# Patient Record
Sex: Male | Born: 1965 | Race: White | Hispanic: No | Smoking: Heavy tobacco smoker
Health system: Southern US, Community
[De-identification: ages and names within clinical notes are randomized; demographics above are authoritative.]

## PROBLEM LIST (undated history)

## (undated) DIAGNOSIS — E785 Hyperlipidemia, unspecified: Secondary | ICD-10-CM

## (undated) DIAGNOSIS — I82409 Acute embolism and thrombosis of unspecified deep veins of unspecified lower extremity: Secondary | ICD-10-CM

## (undated) DIAGNOSIS — J219 Acute bronchiolitis, unspecified: Secondary | ICD-10-CM

## (undated) DIAGNOSIS — K219 Gastro-esophageal reflux disease without esophagitis: Secondary | ICD-10-CM

## (undated) DIAGNOSIS — I739 Peripheral vascular disease, unspecified: Secondary | ICD-10-CM

## (undated) DIAGNOSIS — F419 Anxiety disorder, unspecified: Secondary | ICD-10-CM

## (undated) DIAGNOSIS — J189 Pneumonia, unspecified organism: Secondary | ICD-10-CM

## (undated) HISTORY — DX: Acute bronchiolitis, unspecified: J21.9

## (undated) HISTORY — DX: Pneumonia, unspecified organism: J18.9

## (undated) HISTORY — DX: Anxiety disorder, unspecified: F41.9

## (undated) HISTORY — PX: HAND SURGERY: SHX662

## (undated) HISTORY — DX: Peripheral vascular disease, unspecified: I73.9

## (undated) HISTORY — PX: TONSILLECTOMY: SUR1361

## (undated) HISTORY — DX: Hyperlipidemia, unspecified: E78.5

## (undated) HISTORY — PX: CATARACT EXTRACTION W/ INTRAOCULAR LENS IMPLANT: SHX1309

## (undated) HISTORY — DX: Acute embolism and thrombosis of unspecified deep veins of unspecified lower extremity: I82.409

---
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
## 1993-01-17 HISTORY — PX: ROTATOR CUFF REPAIR: SHX139

## 2004-04-02 ENCOUNTER — Ambulatory Visit: Payer: Self-pay | Admitting: Family Medicine

## 2004-09-06 ENCOUNTER — Ambulatory Visit (HOSPITAL_BASED_OUTPATIENT_CLINIC_OR_DEPARTMENT_OTHER): Admission: RE | Admit: 2004-09-06 | Discharge: 2004-09-06 | Payer: Self-pay | Admitting: Orthopedic Surgery

## 2004-09-06 ENCOUNTER — Ambulatory Visit (HOSPITAL_COMMUNITY): Admission: RE | Admit: 2004-09-06 | Discharge: 2004-09-06 | Payer: Self-pay | Admitting: Orthopedic Surgery

## 2005-01-17 HISTORY — PX: AORTA - ILIAC ARTERY BYPASS GRAFT: SUR174

## 2005-02-23 ENCOUNTER — Ambulatory Visit: Payer: Self-pay | Admitting: Family Medicine

## 2005-03-21 ENCOUNTER — Ambulatory Visit: Payer: Self-pay | Admitting: Family Medicine

## 2005-05-26 ENCOUNTER — Encounter: Admission: RE | Admit: 2005-05-26 | Discharge: 2005-05-26 | Payer: Self-pay | Admitting: Neurosurgery

## 2005-07-01 ENCOUNTER — Ambulatory Visit: Admission: RE | Admit: 2005-07-01 | Discharge: 2005-07-01 | Payer: Self-pay | Admitting: *Deleted

## 2007-07-12 ENCOUNTER — Ambulatory Visit: Payer: Self-pay | Admitting: *Deleted

## 2008-09-04 ENCOUNTER — Ambulatory Visit: Payer: Self-pay | Admitting: *Deleted

## 2008-11-23 ENCOUNTER — Encounter (INDEPENDENT_AMBULATORY_CARE_PROVIDER_SITE_OTHER): Payer: Self-pay | Admitting: Emergency Medicine

## 2008-11-23 ENCOUNTER — Ambulatory Visit: Payer: Self-pay | Admitting: Vascular Surgery

## 2008-11-23 ENCOUNTER — Inpatient Hospital Stay (HOSPITAL_COMMUNITY): Admission: EM | Admit: 2008-11-23 | Discharge: 2008-11-28 | Payer: Self-pay | Admitting: Emergency Medicine

## 2008-11-25 HISTORY — PX: THROMBOLYSIS: SHX2508

## 2008-11-26 ENCOUNTER — Encounter: Payer: Self-pay | Admitting: Vascular Surgery

## 2009-01-06 ENCOUNTER — Ambulatory Visit: Payer: Self-pay | Admitting: Vascular Surgery

## 2009-02-19 ENCOUNTER — Ambulatory Visit: Payer: Self-pay | Admitting: Vascular Surgery

## 2009-02-19 ENCOUNTER — Ambulatory Visit (HOSPITAL_COMMUNITY): Admission: RE | Admit: 2009-02-19 | Discharge: 2009-02-19 | Payer: Self-pay | Admitting: Surgery

## 2009-05-26 ENCOUNTER — Ambulatory Visit: Payer: Self-pay | Admitting: Vascular Surgery

## 2009-12-03 ENCOUNTER — Ambulatory Visit: Payer: Self-pay | Admitting: Vascular Surgery

## 2010-01-29 ENCOUNTER — Ambulatory Visit: Admit: 2010-01-29 | Payer: Self-pay | Admitting: Vascular Surgery

## 2010-04-07 LAB — POCT I-STAT, CHEM 8
Creatinine, Ser: 1.1 mg/dL (ref 0.4–1.5)
HCT: 49 % (ref 39.0–52.0)
TCO2: 33 mmol/L (ref 0–100)

## 2010-04-07 LAB — PROTIME-INR: INR: 0.96 (ref 0.00–1.49)

## 2010-04-21 LAB — DIFFERENTIAL
Basophils Absolute: 0 10*3/uL (ref 0.0–0.1)
Basophils Relative: 0 % (ref 0–1)
Eosinophils Absolute: 0.1 K/uL (ref 0.0–0.7)
Eosinophils Relative: 2 % (ref 0–5)
Lymphocytes Relative: 24 % (ref 12–46)
Lymphs Abs: 1.6 K/uL (ref 0.7–4.0)
Monocytes Absolute: 0.6 10*3/uL (ref 0.1–1.0)
Monocytes Relative: 9 % (ref 3–12)
Neutro Abs: 4.2 10*3/uL (ref 1.7–7.7)
Neutrophils Relative %: 64 % (ref 43–77)

## 2010-04-21 LAB — CBC
HCT: 51.2 % (ref 39.0–52.0)
HCT: 55.8 % — ABNORMAL HIGH (ref 39.0–52.0)
Hemoglobin: 17.1 g/dL — ABNORMAL HIGH (ref 13.0–17.0)
Hemoglobin: 17.3 g/dL — ABNORMAL HIGH (ref 13.0–17.0)
Hemoglobin: 19.5 g/dL — ABNORMAL HIGH (ref 13.0–17.0)
MCHC: 34.5 g/dL (ref 30.0–36.0)
MCHC: 34.7 g/dL (ref 30.0–36.0)
MCHC: 34.8 g/dL (ref 30.0–36.0)
MCHC: 34.9 g/dL (ref 30.0–36.0)
MCV: 104.8 fL — ABNORMAL HIGH (ref 78.0–100.0)
MCV: 106.3 fL — ABNORMAL HIGH (ref 78.0–100.0)
Platelets: 123 10*3/uL — ABNORMAL LOW (ref 150–400)
Platelets: 125 10*3/uL — ABNORMAL LOW (ref 150–400)
Platelets: 131 10*3/uL — ABNORMAL LOW (ref 150–400)
Platelets: 135 10*3/uL — ABNORMAL LOW (ref 150–400)
Platelets: 144 K/uL — ABNORMAL LOW (ref 150–400)
RBC: 4.88 MIL/uL (ref 4.22–5.81)
RBC: 5.33 MIL/uL (ref 4.22–5.81)
RDW: 14.1 % (ref 11.5–15.5)
RDW: 14.3 % (ref 11.5–15.5)
RDW: 14.4 % (ref 11.5–15.5)
RDW: 14.7 % (ref 11.5–15.5)
WBC: 6.6 K/uL (ref 4.0–10.5)

## 2010-04-21 LAB — PROTIME-INR
INR: 0.97 (ref 0.00–1.49)
INR: 0.99 (ref 0.00–1.49)
INR: 1.33 (ref 0.00–1.49)
Prothrombin Time: 12.8 seconds (ref 11.6–15.2)
Prothrombin Time: 13 s (ref 11.6–15.2)
Prothrombin Time: 17.6 seconds — ABNORMAL HIGH (ref 11.6–15.2)

## 2010-04-21 LAB — BASIC METABOLIC PANEL
CO2: 25 mEq/L (ref 19–32)
Calcium: 8.4 mg/dL (ref 8.4–10.5)
Chloride: 97 mEq/L (ref 96–112)
Creatinine, Ser: 1.09 mg/dL (ref 0.4–1.5)
GFR calc Af Amer: >60 mL/min (ref >60)
Potassium: 3.6 mEq/L (ref 3.5–5.1)
Sodium: 139 mEq/L (ref 135–145)

## 2010-04-21 LAB — CREATININE, SERUM: GFR calc non Af Amer: >60 mL/min (ref >60)

## 2010-04-21 LAB — HEPARIN LEVEL (UNFRACTIONATED)
Heparin Unfractionated: 0.31 IU/mL (ref 0.30–0.70)
Heparin Unfractionated: 0.33 IU/mL (ref 0.30–0.70)
Heparin Unfractionated: <0.1 IU/mL — ABNORMAL LOW (ref 0.30–0.70)
Heparin Unfractionated: <0.1 IU/mL — ABNORMAL LOW (ref 0.30–0.70)
Heparin Unfractionated: <0.1 IU/mL — ABNORMAL LOW (ref 0.30–0.70)

## 2010-04-21 LAB — BASIC METABOLIC PANEL WITH GFR
BUN: 5 mg/dL — ABNORMAL LOW (ref 6–23)
GFR calc non Af Amer: >60 mL/min (ref >60)
Glucose, Bld: 98 mg/dL (ref 70–99)

## 2010-04-21 LAB — APTT: aPTT: 25 seconds (ref 24–37)

## 2010-06-01 ENCOUNTER — Ambulatory Visit: Payer: Self-pay | Admitting: Vascular Surgery

## 2010-06-01 NOTE — Procedures (Signed)
DUPLEX DEEP VENOUS EXAM - LOWER EXTREMITY   INDICATION:  Right lower extremity edema.   HISTORY:  Edema:  Yes.  Trauma/Surgery:  No.  Pain:  Yes.  PE:  No.  Previous DVT:  The patient not sure.  Anticoagulants:  No.  Other:   DUPLEX EXAM:                CFV   SFV   PopV  PTV    GSV                R  L  R  L  R  L  R   L  R  L  Thrombosis    o  o  o     o     o      o  Spontaneous   +  +  +     +     +      +  Phasic        +  +  +     +     +      +  Augmentation  +  +  +     +     +      +  Compressible  +  +  +     +     +      +  Competent     +  0  +     +     +      +   Legend:  + - yes  o - no  p - partial  D - decreased   IMPRESSION:  1. Right lower extremity deep are negative for DVT.  2. The right short saphenous vein is positive for clinically      significant reflux of < 500 milliseconds.  3. The right small saphenous vein measures 0.66 cm.    _____________________________  Janetta Hora. Fields, MD   EM/MEDQ  D:  12/03/2009  T:  12/03/2009  Job:  [id number]

## 2010-06-01 NOTE — Assessment & Plan Note (Signed)
OFFICE VISIT   DOYL, BITTING  DOB:  1965/03/01                                       05/26/2009  CHART#:[id number]   The patient returns today for continued followup regarding his recent  episode of thrombosis of the right iliac stent which was treated with  thrombolysis in November of 2010.  The stent has remained widely patent  and he most recently in December had been normal ABIs and no  claudication symptoms.  He does continue to have a tight sensation in  his right calf which is chronic and constant and has only been present  since this thrombotic episode.  He is able to ambulate a mile or so and  states that the tightness slightly improves with walking but is present  even at rest.  He has no history of nonhealing ulcers, infection,  cellulitis or numbness in the feet.   CHRONIC MEDICAL PROBLEMS:  1. Tobacco use.  2. Negative for coronary artery disease, diabetes, COPD or stroke.   REVIEW OF SYSTEMS:  He denies any chest pain, dyspnea on exertion, PND,  orthopnea.  No chronic bronchitis, asthma, wheezing.  Does have some  reflux esophagitis.  All other GI symptoms are negative.  All other  systems in review of systems are negative.   PHYSICAL EXAMINATION:  Vital signs:  Blood pressure 130/90, heart rate  80, respirations 18.  General:  He is well-developed, well-nourished  middle-aged male in no apparent distress, alert and oriented x3.  HEENT:  Exam is normal.  EOMs intact.  Lungs:  Clear to auscultation.  No  rhonchi or wheezing.  Cardiovascular:  Regular rhythm.  No murmurs.  Carotid pulses 3+ and no audible bruits.  Abdomen:  Soft, nontender with  no masses.  Musculoskeletal:  Exam is free of major deformities.  Neurological:  Exam is normal.  Lower extremities:  Exam reveals 3+  femoral and popliteal and posterior tibial pulses palpable bilaterally.   Today I ordered lower extremity arterial Doppler studies which I  reviewed and  interpreted.  He has triphasic flow in both feet with ABI  greater than 1.  He also had a scan of his right common iliac stent  which is free of any thrombus or disease.  I would recommend  discontinuing his Coumadin in 1 month which will be 7 months of Coumadin  and changing him to Plavix.  We will follow him in the vascular lab on  an every 6 month basis to watch for any in-stent stenosis but at the  present time his calf symptoms are probably due more to some neuropathy  rather than arterial insufficiency which he does not have.     Quita Skye Hart [place], M.D.  Electronically Signed   JDL/MEDQ  D:  05/26/2009  T:  05/27/2009  Job:  [id number]   cc:   Dina Rich

## 2010-06-01 NOTE — Procedures (Signed)
AORTA-ILIAC DUPLEX EVALUATION   INDICATION:  Right common iliac artery stent.   HISTORY:  Diabetes:  No.  Cardiac:  No.  Hypertension:  Yes.  Smoking:  Yes.  Previous Surgery:  Right common iliac artery PTA stent in 2007,  thrombolysis of the right common iliac on 11/2008.               SINGLE LEVEL ARTERIAL EXAM                              RIGHT                  LEFT  Brachial:                  129                    131  Anterior tibial:           129                    129  Posterior tibial:          149                    151  Peroneal:  Ankle/brachial index:      1.14                   1.15  Previous ABI/date:         1.14 on 09/04/08       1.14 on 09/04/08   AORTA-ILIAC DUPLEX EXAM  Aorta - Proximal     71 cm/s  Aorta - Mid          64 cm/s  Aorta - Distal       90 cm/s   RIGHT                                   LEFT  115 cm/s          CIA-PROXIMAL          154 cm/s  127 cm/s          CIA-DISTAL            85 cm/s                    HYPOGASTRIC  90 cm/s           EIA-PROXIMAL          98 cm/s                    EIA-MID  141 cm/s          EIA-DISTAL            130 cm/s   IMPRESSION:  1. Patent aorta and iliacs noted bilaterally.  2. Patent right common iliac artery stent with no focal stenosis.  3. Normal bilateral ankle brachial indices.   ___________________________________________  Quita Skye Hart [place], M.D.   CB/MEDQ  D:  01/06/2009  T:  01/07/2009  Job:  [id number]

## 2010-06-01 NOTE — Assessment & Plan Note (Signed)
OFFICE VISIT   Aaron Cunningham, Aaron Cunningham  DOB:  29-Jan-1965                                       01/06/2009  CHART#:[id number]   The patient returns today for followup regarding his recent  hospitalization for thrombolysis of thrombus in his right iliac stent.  He has had no true claudication symptoms since his discharge but has had  some cramping in the right calf intermittently at rest and also has had  numbness in the toes of his right foot following his thrombotic episode  in November.  He continues on Coumadin therapy which is followed by Dr.  Dina Rich.  He has the following:   CHRONIC STABLE PROBLEMS:  1. Tobacco use.  2. Negative for coronary artery disease, diabetes, COPD or stroke.   FAMILY HISTORY:  Positive for coronary artery disease and stroke in his  father who has had coronary artery bypass grafting, negative for  diabetes.   SOCIAL HISTORY:  He is married, has 3 children, works for the [employer].  He is down to smoking 5 cigarettes  per day and I encouraged him to continue to eliminate these completely.  He drinks occasional alcohol.   REVIEW OF SYSTEMS:  Is negative for chest pain, dyspnea on exertion.  Does have occasional reflux esophagitis.  Lower extremity symptoms as  noted above and a chronic sore throat.  All other systems are negative  in the review of systems.   PHYSICAL EXAMINATION:  Blood pressure 129/84, heart rate 72,  respirations 14, temperature 98.  Generally he is a healthy, well-  nourished male in no apparent distress, alert and oriented x3.  HEENT  exam is unremarkable.  Chest clear to auscultation.  Cardiovascular exam  is regular rhythm.  No murmurs.  Abdomen soft, nontender with no masses.  He has 3+ femoral, 2+ popliteal and 2+ posterior tibial pulses  bilaterally.  Both feet are well-perfused with no evidence of neurologic  problems.  There are no skin rashes noted.   Musculoskeletal exam reveals  no deformities.   I ordered and reviewed a lower extremity arterial Doppler study today  which had an ABI of greater than 1.0 bilaterally with no evidence of any  stenosis in the right iliac stent on duplex scanning.   I also reviewed previous angiograms done on November 9 following his  thrombolysis.  At that time he had some residual thrombus within the  stent.   I have ordered an angiogram to be done by Dr. Myra Gianotti on February 3.  We  will discontinue his Coumadin 1 week earlier.  He can approach this  through the left common femoral approach and do an aortogram to see if  there is any residual narrowing within the right iliac stent that might  require further intervention.  Will continue to follow up on a regular  basis in the office.     Quita Skye Hart [place], M.D.  Electronically Signed   JDL/MEDQ  D:  01/06/2009  T:  01/07/2009  Job:  [id number]

## 2010-06-01 NOTE — Assessment & Plan Note (Signed)
OFFICE VISIT   PRAISE, DOLECKI  DOB:  08-11-1965                                       12/03/2009  CHART#:[id number]   CHIEF COMPLAINT:  Cramping in the right calf.   HISTORY OF PRESENT ILLNESS:  Patient is a 45 year old gentleman who has  a right iliac artery stent and had a thrombolysis of the right iliac  stent in November 2010.  The patient states that for the past month, he  has had intermittent cramping like a charley horse,where his calf gets  tighter and tighter, and then he has a severe cramps in the right calf.  This can happen while he is asleep.  It can happen with sitting, with  standing, with exercise.  There are no precipitating events.  He denies  any numbness, tingling, or pain in the foot.  He denies any change in  the temperature of the right lower extremity.  He states the right calf  seems to be larger than the left.  The patient had an angiogram on  02/19/2009 by Dr. Myra Gianotti which showed a widely patent right common  iliac stent without evidence of stenosis or a thrombosis.  The right  profunda femoral and superficial femoral arteries were widely patent.  He had 3-vessel runoff to the ankle.  The peroneal artery is a somewhat  diminutive posterior tibial and the peroneal artery is across the ankle.  He has 2-vessel runoff bilaterally.   The patient's past medical history is significant for  hypercholesterolemia, the right common iliac stenosis, and reflux  disease.   Medications are aspirin, multivitamins, Plavix, Lipitor, Protonix, and  Neurontin.   Vascular labs done today showed that the right lower extremity deep  veins are negative for DVT.  He has some reflux in the lesser saphenous  vein on the right.  His ABIs are within normal limits with triphasic  flow to bilateral lower extremities, that is 1.37 on the right and 1.41  on the left.  The vessels were patent.   PHYSICAL EXAMINATION:  This is a well-developed,  well-nourished  gentleman in no acute distress.  His heart rate was 71.  His saturations  were 99.  His respiratory rate was 10.  His abdomen was soft and  nontender.  He had 2+ palpable femoral pulses.  He had 2+ palpable  posterior tibial pulses bilaterally and 1+ DP pulses bilaterally.  Both  his feet were warm and pink.  He had no tenderness over the right calf.  He had no obvious varicosities in the lesser saphenous vein on the  right.  His right calf is somewhat larger than the left, but both are  soft and nontender.  He has also had palpable popliteal pulses  bilaterally.   ASSESSMENT:  Intermittent cramping of the right calf, etiology unknown.  It is not felt that this is avascular issue secondary to him having  palpable pulses and normal ankle brachial indices as well as negative  ultrasound for deep venous thrombosis.   PLAN:  The patient will be referred to his primary care physician for  further workup.  He will follow up with [place] as needed.   Della Goo, PA-C   Shogo E. Fields, MD  Electronically Signed   RR/MEDQ  D:  12/03/2009  T:  12/03/2009  Job:  [id number]   cc:  Dina Rich

## 2010-06-01 NOTE — Procedures (Signed)
VASCULAR LAB EXAM   INDICATION: Follow up right common iliac artery stent.   HISTORY:  Diabetes.  No.  Cardiac:  No.  Hypertension:  Yes.   EXAM:  Duplex of right iliacs.   IMPRESSION:  1. Patent right iliacs, including right common iliac artery stent.  2. Slightly elevated velocities of 264 cm/s noted in the proximal      right external iliac artery.  3. Right common iliac artery velocities are within normal limits.  4. Right distal aortic velocities are within normal limits.     ___________________________________________  Aaron Cunningham, M.D.   AS/MEDQ  D:  05/26/2009  T:  05/26/2009  Job:  [id number]

## 2010-06-04 NOTE — Op Note (Signed)
NAME:  THOREN, HOSANG NO.:  [id number]   MEDICAL RECORD NO.:  [id number]          PATIENT TYPE:  OUT   LOCATION:  [facility]                         FACILITY:  [facility]   PHYSICIAN:  Harvie Junior, M.D.   DATE OF BIRTH:  [birth year]   DATE OF PROCEDURE:  09/06/2004  DATE OF DISCHARGE:  09/06/2004                                 OPERATIVE REPORT   PREOPERATIVE DIAGNOSIS:  Displaced right long finger fracture, proximal  phalanx.   POSTOPERATIVE DIAGNOSIS:  Displaced right long finger fracture, proximal  phalanx.   PROCEDURE:  Open reduction and internal fixation of right long proximal  phalanx fracture.   SURGEON:  Harvie Junior, M.D.   ASSISTANT:  Marshia Ly, P.A.-C.   ANESTHESIA:  General.   BRIEF HISTORY:  Mr. Sproule is a 45 year old male with a long history of  having had a displaced right long finger proximal phalanx fracture that was  treated at another facility and ultimately presented to [place] at seven days.  At that time, x-rays showed that he had a rotational malaligned finger and  we felt that open reduction and internal fixation was the most appropriate  course of action.  We discussed this and got it cleared through [facility] and once it was cleared he was taken to the operating room 10-11 days  after his initial injury for open reduction and internal fixation.   DESCRIPTION OF PROCEDURE:  The patient was taken to the operating room and  after adequate anesthesia was obtained with general anesthetic, the patient  was placed on the operating table.  The right arm was then prepped and  draped in the usual sterile fashion.  Following this, the arm was  exsanguinated and the tourniquet was inflated to 250 mmHg.  Following this,  a curved incision was made over the proximal phalanx.  The subcutaneous  tissue was dissected down to the level of the extensor tendon which was  split at the mid portion.  Following this, attention was turned towards  the  interval between the mid and lateral bands and this periosteum was opened in  this area, the fracture was identified.  Healing elements were curetted and  the fracture was then held in an anatomically reduced position and  interfragmentation fixation as well as plate fixation was used for  neutralization of the fracture.  Essentially, anatomic fixation was  achieved, although significant dissection was necessary to get to this, in  particular on the volar aspect, with concern over having to dissect down  near the area where the flexor tendons come to attach to the proximal  portion of the middle phalanx.  Once this dissection had been completed and  the fracture was anatomically reduced, it was fixed as outlined.  The  periosteum was closed over this.  The extensor tendons were closed with a  running suture, the skin was closed with interrupted suture.  A sterile  compressive dressing was  applied as well as a splint.  The patient was taken to the recovery room  where he was noted to be in satisfactory condition.  Estimated blood  loss  was none.  Of note, fluoroscopy was used throughout the case to insure  adequate and appropriate alignment.      Harvie Junior, M.D.  Electronically Signed     JLG/MEDQ  D:  12/22/2004  T:  12/22/2004  Job:  [id number]

## 2010-06-04 NOTE — Op Note (Signed)
NAME:  Aaron Cunningham, Aaron Cunningham NO.:  [id number]   MEDICAL RECORD NO.:  [id number]          PATIENT TYPE:  AMB   LOCATION:  [facility]                         FACILITY:  [facility]   PHYSICIAN:  Balinda Quails, M.D.    DATE OF BIRTH:  1965/06/29   DATE OF PROCEDURE:  07/01/2005  DATE OF DISCHARGE:                                 OPERATIVE REPORT   DIAGNOSIS:  Right lower extremity claudication.   PROCEDURE:  1.  Abdominal aortogram with bilateral lower extremity runoff arteriography.  2.  Right common iliac artery percutaneous transluminal angioplasty/stent.  3.  Starclose right common femoral artery.   ACCESS:  Right common femoral artery 7-French sheath.   CONTRAST:  250 mL Visipaque.   COMPLICATIONS:  None apparent.   CLINICAL NOTE:  Marchello Rothgeb is a 45 year old male with a history of  heavy tobacco use.  He complains of right buttock, hip, and thigh  claudication symptoms.  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  He is brought to the cath lab at this time  for diagnostic arteriography and possible intervention.   PROCEDURE NOTE:  The patient brought to the cath lab in stable condition.  Placed in supine position.  Received 2 mg of Versed, 50 mcg of femoral  intravenously.  Right groin prepped and draped in sterile fashion.  Skin and  subcutaneous tissues instilled with 1% Xylocaine.  A needle easily  introduced into the right common femoral artery.  A 0.035 Magic torque  guidewire advanced through the needle and into the mid abdominal aorta.  The  needle removed, 5-French sheath advanced over the guidewire.  The dilator  removed, the sheath flushed with heparin saline solution.   A standard pigtail catheter was then advanced over guidewire to the mid  abdominal aorta.   Standard AP mid abdominal aortogram obtained.  This revealed widely patent  bilateral renal arteries.  An accessory right inferior pole renal artery was  noted,  this was also widely patent.  The infrarenal aorta was normal in  caliber.  The proximal common iliac arteries were normal.  There was a high-  grade 80% stenosis of the distal right common iliac artery present.  The  hypogastric arteries were intact bilaterally.  The external iliac arteries  were normal in caliber.  Infrainguinal runoff revealed patent common  femoral, profunda femoris, superficial femoral, popliteal, and three-vessel  tibial runoff bilaterally.   The 5-French sheath was then removed and a long 7-French sheath advanced  over guidewire.  Retrograde injection then made through the right femoral  sheath in the oblique projection to delineate the right common iliac artery  stenosis.  A ___________ tape was used to aid with measurement.   The 7-French sheath was then advanced across the stenosis.  An 8 x 24  Opta/Genesis stent was then advanced through the sheath and positioned at  the right common iliac artery stenosis.  The sheath withdrawn.  The stent  deployed at 8 atmospheres for 30 seconds.  The balloon then removed, and a  completion arteriogram revealed an excellent technical result with minimal  residual  stenosis and no significant dissection.   The sheath was then drawn back into the right external iliac artery.  A RAO  oblique right femoral arteriogram was obtained, and this verified the sheath  to be in the right common femoral artery with no significant right common  femoral artery disease.  The sheath was then removed and a Starclose sheath  advanced over guidewire.  The Starclose device then advanced through the  sheath and the device withdrawn back to the arterial wall and deployed  without difficulty.  The #4 Click technique was used.  There were no  apparent complications.   FINAL IMPRESSION:  1.  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  2.  Successful right common iliac artery percutaneous  transluminal      angioplasty/stent with minimal residual stenosis.  3.  Starclose right common femoral artery without apparent complications.   DISPOSITION:  These results will be reviewed with the patient and family.  The patient will be discharged today.  Six weeks of Plavix.  Return to the  office in approximately 4 weeks.      Balinda Quails, M.D.  Electronically Signed     PGH/MEDQ  D:  07/01/2005  T:  07/01/2005  Job:  [id number]   cc:   Dina Rich  Fax: [phone]   Reinaldo Meeker, M.D.  Fax: [phone]   Elita Boone, M.D.

## 2010-06-22 ENCOUNTER — Ambulatory Visit: Payer: Self-pay | Admitting: Vascular Surgery

## 2010-07-05 ENCOUNTER — Encounter (INDEPENDENT_AMBULATORY_CARE_PROVIDER_SITE_OTHER): Payer: BC Managed Care – PPO

## 2010-07-05 ENCOUNTER — Ambulatory Visit (INDEPENDENT_AMBULATORY_CARE_PROVIDER_SITE_OTHER): Payer: BC Managed Care – PPO | Admitting: Vascular Surgery

## 2010-07-05 DIAGNOSIS — I70219 Atherosclerosis of native arteries of extremities with intermittent claudication, unspecified extremity: Secondary | ICD-10-CM

## 2010-07-05 DIAGNOSIS — Z48812 Encounter for surgical aftercare following surgery on the circulatory system: Secondary | ICD-10-CM

## 2010-07-05 NOTE — Assessment & Plan Note (Signed)
OFFICE VISIT  Aaron Cunningham, Aaron Cunningham DOB:  10-17-1965                                       07/05/2010 CHART#:[id number]  Patient returns to the office today concerns about numbness in his left lower extremity.  He states about 1 month ago he developed a patch of numbness in his distal bilaterally, which is enlarged in size.  He did have some back pain at that point but does not have chronic back pain nor a history of degenerative disk disease.  He has no claudication symptoms, able to ambulate long distances.  He no longer takes Coumadin and is currently on Plavix.  CHRONIC MEDICAL PROBLEMS: 1. A history of tobacco abuse. 2. GERD. 3. Hyperlipidemia. 4. History of right common iliac stent. 5. Negative for coronary artery disease, diabetes, COPD or stroke.  FAMILY HISTORY:  Positive for coronary artery disease and carotid disease in his father.  SOCIAL HISTORY:  Admits to smoking about a half pack of cigarettes per day.  Has smoked for 30+ years.  He drinks occasional alcohol.  REVIEW OF SYSTEMS:  Otherwise totally normal complete review of systems.  PHYSICAL EXAMINATION:  Blood pressure 141/93, heart rate 83, respirations 14.  General:  He is a well-developed, well-nourished male in no apparent distress, alert and oriented x3.  HEENT:  Normal for age. Lungs:  Clear to auscultation.  No rhonchi or wheezing.  Cardiovascular: Regular rhythm.  No murmurs.  Carotid pulses are 3+.  No bruits. Abdomen:  Soft, nontender with no masses.  Musculoskeletal:  Free of major deformities.  Neurologic:  Normal except for some decreased sensation in the mid to distal thigh laterally on the left.  He has 3+ femoral, popliteal, dorsalis pedis, and posterior tibial pulses bilaterally.  Today I ordered lower extremity arterial Dopplers which I have reviewed and interpreted.  There is no evidence of stenosis or thrombus within his right iliac stent.  His ABIs are normal  bilaterally.  I have reassured him regarding his vascular status, which we will continue to follow.  If he has further concerns, I would recommend evaluation by an orthopedic or neurosurgeon for possible nerve compression syndrome.    Quita Skye Hart [place], M.D. Electronically Signed  JDL/MEDQ  D:  07/05/2010  T:  07/05/2010  Job:  [id number]

## 2010-07-13 NOTE — Procedures (Unsigned)
AORTA-ILIAC DUPLEX EVALUATION  INDICATION:  Right common iliac artery stent.  HISTORY: Diabetes:  No. Cardiac:  No. Hypertension:  Yes. Smoking:  Yes. Previous Surgery:  Right common iliac artery PTA/stent in 2007 with thrombolysis in 2010.              SINGLE LEVEL ARTERIAL EXAM                             RIGHT                  LEFT Brachial:                  134                    137 Anterior tibial:           148                    151 Posterior tibial:          149                    159 Peroneal: Ankle/brachial index:      1.09                   1.16 Previous ABI/date:         12/03/2009, 1.37       12/03/2009, 1.41  AORTA-ILIAC DUPLEX EXAM Aorta - Proximal     91 cm/s Aorta - Mid          84 cm/s Aorta - Distal       75 cm/s  RIGHT                                   LEFT 136 cm/s          CIA-PROXIMAL 230 cm/s          CIA-DISTAL Not visualized    HYPOGASTRIC 137 cm/s          EIA-PROXIMAL 106 cm/s          EIA-MID 107 cm/s          EIA-DISTAL  IMPRESSION: 1. Patent right common iliac artery stents with velocities, as     described above.  Limited visualization of the right common iliac     artery due to overlying bowel gas patterns. 2. The bilateral ankle brachial indices suggest normal perfusion of     the bilateral lower extremities.  Bilateral ankle brachial indices     appear stable when compared to the previous examination.  ___________________________________________ Quita Skye Hart [place], M.D.  CH/MEDQ  D:  07/07/2010  T:  07/07/2010  Job:  [id number]

## 2011-07-04 ENCOUNTER — Ambulatory Visit (INDEPENDENT_AMBULATORY_CARE_PROVIDER_SITE_OTHER): Payer: BC Managed Care – PPO | Admitting: Vascular Surgery

## 2011-07-04 DIAGNOSIS — Z48812 Encounter for surgical aftercare following surgery on the circulatory system: Secondary | ICD-10-CM

## 2011-07-04 DIAGNOSIS — I739 Peripheral vascular disease, unspecified: Secondary | ICD-10-CM

## 2011-07-25 ENCOUNTER — Other Ambulatory Visit: Payer: Self-pay

## 2011-07-25 DIAGNOSIS — I739 Peripheral vascular disease, unspecified: Secondary | ICD-10-CM

## 2011-07-25 DIAGNOSIS — Z48812 Encounter for surgical aftercare following surgery on the circulatory system: Secondary | ICD-10-CM

## 2011-07-26 ENCOUNTER — Encounter: Payer: Self-pay | Admitting: Vascular Surgery

## 2011-07-26 NOTE — Procedures (Unsigned)
AORTA-ILIAC DUPLEX EVALUATION  INDICATION:  Peripheral vascular disease.  HISTORY: Diabetes:  No. Cardiac:  No. Hypertension:  Yes. Smoking:  Currently. Previous Surgery:  Right common iliac artery stent with PTA, 07/01/2005; right lower extremity thrombolysis, 11/25/2008.              SINGLE LEVEL ARTERIAL EXAM                             RIGHT                  LEFT Brachial: Anterior tibial: Posterior tibial: Peroneal: Ankle/brachial index:      1.17                   1.21 Previous ABI/date: 07/05/2010                     1.09 1.16  AORTA-ILIAC DUPLEX EXAM Aorta - Proximal     61 cm/s Aorta - Mid          70 cm/s Aorta - Distal       72 cm/s  RIGHT                                   LEFT 97 cm/s - stent   CIA-PROXIMAL 205 cm/s  - stent CIA-DISTAL 148 cm/s          HYPOGASTRIC 243 cm/s          EIA-PROXIMAL 85 cm/s           EIA-MID 101 cm/s          EIA-DISTAL  IMPRESSION: 1. Patent abdominal aorta with minimal diffuse heterogenous plaque     present. 2. Patent right common iliac artery stent, limited visualization due     to small caliber.  Mildly elevated velocities noted at the distal     anastomosis, which may suggest stenosis; however, no     hemodynamically significant plaque or post-stenotic turbulence is     evident. 3. Patent internal and external iliac arteries with mildly elevated     proximal external iliac artery velocity, suggesting 50% to 75%     stenosis.  However, no significant plaque or post-stenotic     turbulence is present. 4. Bilateral ankle brachial indices are considered normal and     unchanged since the previous study on 07/05/2010.  ___________________________________________ Quita Skye. Hart Rochester, M.D.  SH/MEDQ  D:  07/04/2011  T:  07/04/2011  Job:  [id number]

## 2012-03-08 ENCOUNTER — Other Ambulatory Visit: Payer: Self-pay | Admitting: *Deleted

## 2012-03-08 ENCOUNTER — Ambulatory Visit (INDEPENDENT_AMBULATORY_CARE_PROVIDER_SITE_OTHER): Payer: BC Managed Care – PPO | Admitting: Vascular Surgery

## 2012-03-08 DIAGNOSIS — I739 Peripheral vascular disease, unspecified: Secondary | ICD-10-CM

## 2012-03-08 DIAGNOSIS — Z48812 Encounter for surgical aftercare following surgery on the circulatory system: Secondary | ICD-10-CM

## 2012-03-08 NOTE — Progress Notes (Signed)
Ankle brachial index performed @ [facility] 03/08/2012; Dr fields instructed for patient to return in 1 year for follow up ankle brachial index and to see rusty NP after-SAH

## 2012-03-09 ENCOUNTER — Other Ambulatory Visit: Payer: Self-pay | Admitting: *Deleted

## 2012-07-03 ENCOUNTER — Ambulatory Visit: Payer: BC Managed Care – PPO | Admitting: Neurosurgery

## 2012-11-22 ENCOUNTER — Other Ambulatory Visit: Payer: Self-pay

## 2013-03-07 ENCOUNTER — Encounter (HOSPITAL_COMMUNITY): Payer: BC Managed Care – PPO

## 2013-03-07 ENCOUNTER — Ambulatory Visit: Payer: BC Managed Care – PPO | Admitting: Family

## 2014-01-17 HISTORY — PX: IVC FILTER PLACEMENT (ARMC HX): HXRAD1551

## 2014-02-17 DIAGNOSIS — J189 Pneumonia, unspecified organism: Secondary | ICD-10-CM

## 2014-02-17 DIAGNOSIS — J219 Acute bronchiolitis, unspecified: Secondary | ICD-10-CM

## 2014-02-17 HISTORY — DX: Acute bronchiolitis, unspecified: J21.9

## 2014-02-17 HISTORY — DX: Pneumonia, unspecified organism: J18.9

## 2014-03-17 ENCOUNTER — Other Ambulatory Visit: Payer: Self-pay | Admitting: *Deleted

## 2014-03-17 ENCOUNTER — Encounter: Payer: Self-pay | Admitting: Family

## 2014-03-17 DIAGNOSIS — M79606 Pain in leg, unspecified: Secondary | ICD-10-CM

## 2014-03-18 ENCOUNTER — Ambulatory Visit (HOSPITAL_COMMUNITY)
Admission: RE | Admit: 2014-03-18 | Discharge: 2014-03-18 | Disposition: A | Discharge status: Home / Self Care | Payer: BC Managed Care – PPO | Source: Ambulatory Visit | Attending: Family | Admitting: Family

## 2014-03-18 ENCOUNTER — Ambulatory Visit (INDEPENDENT_AMBULATORY_CARE_PROVIDER_SITE_OTHER): Payer: BC Managed Care – PPO | Admitting: Family

## 2014-03-18 ENCOUNTER — Encounter: Payer: Self-pay | Admitting: Family

## 2014-03-18 VITALS — BP 152/99 | HR 70 | Resp 16 | Ht 68.5 in | Wt 178.0 lb

## 2014-03-18 DIAGNOSIS — I10 Essential (primary) hypertension: Secondary | ICD-10-CM | POA: Insufficient documentation

## 2014-03-18 DIAGNOSIS — I739 Peripheral vascular disease, unspecified: Secondary | ICD-10-CM | POA: Diagnosis not present

## 2014-03-18 DIAGNOSIS — F17200 Nicotine dependence, unspecified, uncomplicated: Secondary | ICD-10-CM

## 2014-03-18 DIAGNOSIS — Z95828 Presence of other vascular implants and grafts: Secondary | ICD-10-CM

## 2014-03-18 DIAGNOSIS — Z720 Tobacco use: Secondary | ICD-10-CM

## 2014-03-18 DIAGNOSIS — E785 Hyperlipidemia, unspecified: Secondary | ICD-10-CM | POA: Diagnosis not present

## 2014-03-18 DIAGNOSIS — Z9889 Other specified postprocedural states: Secondary | ICD-10-CM

## 2014-03-18 DIAGNOSIS — M79606 Pain in leg, unspecified: Secondary | ICD-10-CM | POA: Diagnosis present

## 2014-03-18 DIAGNOSIS — M79662 Pain in left lower leg: Secondary | ICD-10-CM | POA: Insufficient documentation

## 2014-03-18 NOTE — Progress Notes (Signed)
[facility] [place] HISTORY AND PHYSICAL -PAD  History of Present Illness [place] Scott Bilotta is a 49 y.o. male former patient of Dr. Madilyn FiremanHayes who returns today for evaluation of his PAD. He seems to have been lost to follow up. He is s/p right CIA stent with PTA in 2007 and right LE thrombolysis in 2010 by Dr. Hart RochesterLawson. Review of records: February 2014 ABI's were in the normal range with bi and triphasic waveforms, digit pressures were normal range. He reports left calf tight feeling started 2-3 days ago, same with walking, sitting, lying down, feels sore. Stretching temporarily relieves this. He walks as part of his job most of his day, 7 days/week, very little sitting. He denies non healing wounds.   Pt denies any history of stroke or TIA.  The patient reports New Medical or Surgical History: bronchitis and pneumonia February 2016, not hospitalized.   Pt Diabetic: No Pt smoker: smoker  (1/2 ppd, started at age 49 yrs)  Pt meds include: Statin :No pt quit taking on his own volition, advised pt to let his PCP know ASA: Yes Other anticoagulants/antiplatelets: no  Past Medical History  Diagnosis Date  . Peripheral vascular disease   . CAD (coronary artery disease)   . Bronchiolitis Feb. 2016  . Pneumonia Feb. 2016    Social History History  Substance Use Topics  . Smoking status: Not on file  . Smokeless tobacco: Not on file  . Alcohol Use: Not on file   Past Surgical History  Procedure Laterality Date  . Thrombolysis Right Nov. 9, 2010    Lower Extrim.    Family History  Problem Relation Age of Onset  . Deep vein thrombosis Father   . Heart disease Father 2842    Before age 49  . Hyperlipidemia Father   . Heart attack Father   . Stroke Father     No outpatient prescriptions prior to visit.   No facility-administered medications prior to visit.       ROS: See HPI for pertinent positives and negatives.   Physical Examination  Filed  Vitals:   03/18/14 1156  BP: 152/99  Pulse: 70  Resp: 16  Height: 5' 8.5" (1.74 m)  Weight: 178 lb (80.74 kg)  SpO2: 97%   Body mass index is 26.67 kg/(m^2).  General: A&O x 3, WDWN. Gait: normal Eyes: PERRLA. Pulmonary: CTAB, without wheezes , rales or rhonchi. Cardiac: regular Rythm , without detected murmur.         Carotid Bruits Right Left   Negative Negative  Aorta is not palpable. Radial pulses: 2+ palpable and =                           VASCULAR EXAM: Extremities without ischemic changes  without Gangrene; without open wounds. No swelling or edema in lower legs.  LE Pulses Right Left       FEMORAL  2+ palpable  1+ palpable        POPLITEAL  not palpable   1+ palpable       POSTERIOR TIBIAL   palpable    palpable        DORSALIS PEDIS      ANTERIOR TIBIAL  palpable   palpable    Abdomen: soft, NT, no palpable masses. Skin: no rashes, no ulcers. Musculoskeletal: no muscle wasting or atrophy.  Neurologic: A&O X 3; Appropriate Affect ; SENSATION: normal; MOTOR FUNCTION:  moving all extremities equally, motor strength 5/5 throughout. Speech is fluent/normal. CN 2-12 intact.    Non-Invasive Vascular Imaging: DATE: 03/18/2014 ABI: RIGHT 1.12 (03/08/12, 1.07), Waveforms: triphasic;  LEFT 1.12 (1.07), Waveforms: triphasic   ASSESSMENT: Stevon Gough is a 49 y.o. male who is s/p right CIA stent with PTA in 2007 and right LE thrombolysis in 2010 by Dr. Hart Rochester. Review of records: February 2014 ABI's were in the normal range with bi and triphasic waveforms, digit pressures were normal range. He reports left calf tight feeling started 2-3 days ago, same with walking, sitting, lying down, feels sore. Stretching temporarily relieves this. He walks as part of his job most of his day, 7 days/week, very little sitting. He denies non healing wounds.   Pt denies  any history of stroke or TIA.  Unfortunately he continues to smoke.   PLAN:  The patient was counseled re smoking cessation and given several free resources re smoking cessation.  I discussed in depth with the patient the nature of atherosclerosis, and emphasized the importance of maximal medical management including strict control of blood pressure, blood glucose, and lipid levels, obtaining regular exercise, and cessation of smoking.  The patient is aware that without maximal medical management the underlying atherosclerotic disease process will progress, limiting the benefit of any interventions.  Based on the patient's vascular studies and examination, and after discussing the pt's left calf pain, HPI, surgical hx, pt will return to clinic in 1 year for ABI's and right iliac artery Duplex for stent evaluation. He knows to return sooner if needed.  The patient was given information about PAD including signs, symptoms, treatment, what symptoms should prompt the patient to seek immediate medical care, and risk reduction measures to take.  Charisse March, RN, MSN, FNP-C [facility] Phone: [phone]  Clinic MD: Early  03/18/2014 10:36 AM

## 2014-03-18 NOTE — Patient Instructions (Addendum)
Peripheral Vascular Disease Peripheral Vascular Disease (PVD), also called Peripheral Arterial Disease (PAD), is a circulation problem caused by cholesterol (atherosclerotic plaque) deposits in the arteries. PVD commonly occurs in the lower extremities (legs) but it can occur in other areas of the body, such as your arms. The cholesterol buildup in the arteries reduces blood flow which can cause pain and other serious problems. The presence of PVD can place a person at risk for Coronary Artery Disease (CAD).  CAUSES  Causes of PVD can be many. It is usually associated with more than one risk factor such as:   High Cholesterol.  Smoking.  Diabetes.  Lack of exercise or inactivity.  High blood pressure (hypertension).  Obesity.  Family history. SYMPTOMS   When the lower extremities are affected, patients with PVD may experience:  Leg pain with exertion or physical activity. This is called INTERMITTENT CLAUDICATION. This may present as cramping or numbness with physical activity. The location of the pain is associated with the level of blockage. For example, blockage at the abdominal level (distal abdominal aorta) may result in buttock or hip pain. Lower leg arterial blockage may result in calf pain.  As PVD becomes more severe, pain can develop with less physical activity.  In people with severe PVD, leg pain may occur at rest.  Other PVD signs and symptoms:  Leg numbness or weakness.  Coldness in the affected leg or foot, especially when compared to the other leg.  A change in leg color.  Patients with significant PVD are more prone to ulcers or sores on toes, feet or legs. These may take longer to heal or may reoccur. The ulcers or sores can become infected.  If signs and symptoms of PVD are ignored, gangrene may occur. This can result in the loss of toes or loss of an entire limb.  Not all leg pain is related to PVD. Other medical conditions can cause leg pain such  as:  Blood clots (embolism) or Deep Vein Thrombosis.  Inflammation of the blood vessels (vasculitis).  Spinal stenosis. DIAGNOSIS  Diagnosis of PVD can involve several different types of tests. These can include:  Pulse Volume Recording Method (PVR). This test is simple, painless and does not involve the use of X-rays. PVR involves measuring and comparing the blood pressure in the arms and legs. An ABI (Ankle-Brachial Index) is calculated. The normal ratio of blood pressures is 1. As this number becomes smaller, it indicates more severe disease.  < 0.95 - indicates significant narrowing in one or more leg vessels.  <0.8 - there will usually be pain in the foot, leg or buttock with exercise.  <0.4 - will usually have pain in the legs at rest.  <0.25 - usually indicates limb threatening PVD.  Doppler detection of pulses in the legs. This test is painless and checks to see if you have a pulses in your legs/feet.  A dye or contrast material (a substance that highlights the blood vessels so they show up on x-ray) may be given to help your caregiver better see the arteries for the following tests. The dye is eliminated from your body by the kidney's. Your caregiver may order blood work to check your kidney function and other laboratory values before the following tests are performed:  Magnetic Resonance Angiography (MRA). An MRA is a picture study of the blood vessels and arteries. The MRA machine uses a large magnet to produce images of the blood vessels.  Computed Tomography Angiography (CTA). A CTA   is a specialized x-ray that looks at how the blood flows in your blood vessels. An IV may be inserted into your arm so contrast dye can be injected.  Angiogram. Is a procedure that uses x-rays to look at your blood vessels. This procedure is minimally invasive, meaning a small incision (cut) is made in your groin. A small tube (catheter) is then inserted into the artery of your groin. The catheter  is guided to the blood vessel or artery your caregiver wants to examine. Contrast dye is injected into the catheter. X-rays are then taken of the blood vessel or artery. After the images are obtained, the catheter is taken out. TREATMENT  Treatment of PVD involves many interventions which may include:  Lifestyle changes:  Quitting smoking.  Exercise.  Following a low fat, low cholesterol diet.  Control of diabetes.  Foot care is very important to the PVD patient. Good foot care can help prevent infection.  Medication:  Cholesterol-lowering medicine.  Blood pressure medicine.  Anti-platelet drugs.  Certain medicines may reduce symptoms of Intermittent Claudication.  Interventional/Surgical options:  Angioplasty. An Angioplasty is a procedure that inflates a balloon in the blocked artery. This opens the blocked artery to improve blood flow.  Stent Implant. A wire mesh tube (stent) is placed in the artery. The stent expands and stays in place, allowing the artery to remain open.  Peripheral Bypass Surgery. This is a surgical procedure that reroutes the blood around a blocked artery to help improve blood flow. This type of procedure may be performed if Angioplasty or stent implants are not an option. SEEK IMMEDIATE MEDICAL CARE IF:   You develop pain or numbness in your arms or legs.  Your arm or leg turns cold, becomes blue in color.  You develop redness, warmth, swelling and pain in your arms or legs. MAKE SURE YOU:   Understand these instructions.  Will watch your condition.  Will get help right away if you are not doing well or get worse. Document Released: 02/11/2004 Document Revised: 03/28/2011 Document Reviewed: 01/08/2008 ExitCare Patient Information 2015 [employer]. This information is not intended to replace advice given to you by your health care provider. Make sure you discuss any questions you have with your health care provider.    Smoking  Cessation Quitting smoking is important to your health and has many advantages. However, it is not always easy to quit since nicotine is a very addictive drug. Oftentimes, people try 3 times or more before being able to quit. This document explains the best ways for you to prepare to quit smoking. Quitting takes hard work and a lot of effort, but you can do it. ADVANTAGES OF QUITTING SMOKING  You will live longer, feel better, and live better.  Your body will feel the impact of quitting smoking almost immediately.  Within 20 minutes, blood pressure decreases. Your pulse returns to its normal level.  After 8 hours, carbon monoxide levels in the blood return to normal. Your oxygen level increases.  After 24 hours, the chance of having a heart attack starts to decrease. Your breath, hair, and body stop smelling like smoke.  After 48 hours, damaged nerve endings begin to recover. Your sense of taste and smell improve.  After 72 hours, the body is virtually free of nicotine. Your bronchial tubes relax and breathing becomes easier.  After 2 to 12 weeks, lungs can hold more air. Exercise becomes easier and circulation improves.  The risk of having a heart attack, stroke,   cancer, or lung disease is greatly reduced.  After 1 year, the risk of coronary heart disease is cut in half.  After 5 years, the risk of stroke falls to the same as a nonsmoker.  After 10 years, the risk of lung cancer is cut in half and the risk of other cancers decreases significantly.  After 15 years, the risk of coronary heart disease drops, usually to the level of a nonsmoker.  If you are pregnant, quitting smoking will improve your chances of having a healthy baby.  The people you live with, especially any children, will be healthier.  You will have extra money to spend on things other than cigarettes. QUESTIONS TO THINK ABOUT BEFORE ATTEMPTING TO QUIT You may want to talk about your answers with your health care  provider.  Why do you want to quit?  If you tried to quit in the past, what helped and what did not?  What will be the most difficult situations for you after you quit? How will you plan to handle them?  Who can help you through the tough times? Your family? Friends? A health care provider?  What pleasures do you get from smoking? What ways can you still get pleasure if you quit? Here are some questions to ask your health care provider:  How can you help me to be successful at quitting?  What medicine do you think would be best for me and how should I take it?  What should I do if I need more help?  What is smoking withdrawal like? How can I get information on withdrawal? GET READY  Set a quit date.  Change your environment by getting rid of all cigarettes, ashtrays, matches, and lighters in your home, car, or work. Do not let people smoke in your home.  Review your past attempts to quit. Think about what worked and what did not. GET SUPPORT AND ENCOURAGEMENT You have a better chance of being successful if you have help. You can get support in many ways.  Tell your family, friends, and coworkers that you are going to quit and need their support. Ask them not to smoke around you.  Get individual, group, or telephone counseling and support. Programs are available at local hospitals and health centers. Call your local health department for information about programs in your area.  Spiritual beliefs and practices may help some smokers quit.  Download a "quit meter" on your computer to keep track of quit statistics, such as how long you have gone without smoking, cigarettes not smoked, and money saved.  Get a self-help book about quitting smoking and staying off tobacco. LEARN NEW SKILLS AND BEHAVIORS  Distract yourself from urges to smoke. Talk to someone, go for a walk, or occupy your time with a task.  Change your normal routine. Take a different route to work. Drink tea  instead of coffee. Eat breakfast in a different place.  Reduce your stress. Take a hot bath, exercise, or read a book.  Plan something enjoyable to do every day. Reward yourself for not smoking.  Explore interactive web-based programs that specialize in helping you quit. GET MEDICINE AND USE IT CORRECTLY Medicines can help you stop smoking and decrease the urge to smoke. Combining medicine with the above behavioral methods and support can greatly increase your chances of successfully quitting smoking.  Nicotine replacement therapy helps deliver nicotine to your body without the negative effects and risks of smoking. Nicotine replacement therapy includes nicotine gum, lozenges,   inhalers, nasal sprays, and skin patches. Some may be available over-the-counter and others require a prescription.  Antidepressant medicine helps people abstain from smoking, but how this works is unknown. This medicine is available by prescription.  Nicotinic receptor partial agonist medicine simulates the effect of nicotine in your brain. This medicine is available by prescription. Ask your health care provider for advice about which medicines to use and how to use them based on your health history. Your health care provider will tell you what side effects to look out for if you choose to be on a medicine or therapy. Carefully read the information on the package. Do not use any other product containing nicotine while using a nicotine replacement product.  RELAPSE OR DIFFICULT SITUATIONS Most relapses occur within the first 3 months after quitting. Do not be discouraged if you start smoking again. Remember, most people try several times before finally quitting. You may have symptoms of withdrawal because your body is used to nicotine. You may crave cigarettes, be irritable, feel very hungry, cough often, get headaches, or have difficulty concentrating. The withdrawal symptoms are only temporary. They are strongest when you  first quit, but they will go away within 10-14 days. To reduce the chances of relapse, try to:  Avoid drinking alcohol. Drinking lowers your chances of successfully quitting.  Reduce the amount of caffeine you consume. Once you quit smoking, the amount of caffeine in your body increases and can give you symptoms, such as a rapid heartbeat, sweating, and anxiety.  Avoid smokers because they can make you want to smoke.  Do not let weight gain distract you. Many smokers will gain weight when they quit, usually less than 10 pounds. Eat a healthy diet and stay active. You can always lose the weight gained after you quit.  Find ways to improve your mood other than smoking. FOR MORE INFORMATION  www.smokefree.gov  Document Released: 12/28/2000 Document Revised: 05/20/2013 Document Reviewed: 04/14/2011 ExitCare Patient Information 2015 [employer]. This information is not intended to replace advice given to you by your health care provider. Make sure you discuss any questions you have with your health care provider.    Smoking Cessation, Tips for Success If you are ready to quit smoking, congratulations! You have chosen to help yourself be healthier. Cigarettes bring nicotine, tar, carbon monoxide, and other irritants into your body. Your lungs, heart, and blood vessels will be able to work better without these poisons. There are many different ways to quit smoking. Nicotine gum, nicotine patches, a nicotine inhaler, or nicotine nasal spray can help with physical craving. Hypnosis, support groups, and medicines help break the habit of smoking. WHAT THINGS CAN I DO TO MAKE QUITTING EASIER?  Here are some tips to help you quit for good:  Pick a date when you will quit smoking completely. Tell all of your friends and family about your plan to quit on that date.  Do not try to slowly cut down on the number of cigarettes you are smoking. Pick a quit date and quit smoking completely starting on that  day.  Throw away all cigarettes.   Clean and remove all ashtrays from your home, work, and car.  On a card, write down your reasons for quitting. Carry the card with you and read it when you get the urge to smoke.  Cleanse your body of nicotine. Drink enough water and fluids to keep your urine clear or pale yellow. Do this after quitting to flush the nicotine from   your body.  Learn to predict your moods. Do not let a bad situation be your excuse to have a cigarette. Some situations in your life might tempt you into wanting a cigarette.  Never have "just one" cigarette. It leads to wanting another and another. Remind yourself of your decision to quit.  Change habits associated with smoking. If you smoked while driving or when feeling stressed, try other activities to replace smoking. Stand up when drinking your coffee. Brush your teeth after eating. Sit in a different chair when you read the paper. Avoid alcohol while trying to quit, and try to drink fewer caffeinated beverages. Alcohol and caffeine may urge you to smoke.  Avoid foods and drinks that can trigger a desire to smoke, such as sugary or spicy foods and alcohol.  Ask people who smoke not to smoke around you.  Have something planned to do right after eating or having a cup of coffee. For example, plan to take a walk or exercise.  Try a relaxation exercise to calm you down and decrease your stress. Remember, you may be tense and nervous for the first 2 weeks after you quit, but this will pass.  Find new activities to keep your hands busy. Play with a pen, coin, or rubber band. Doodle or draw things on paper.  Brush your teeth right after eating. This will help cut down on the craving for the taste of tobacco after meals. You can also try mouthwash.   Use oral substitutes in place of cigarettes. Try using lemon drops, carrots, cinnamon sticks, or chewing gum. Keep them handy so they are available when you have the urge to  smoke.  When you have the urge to smoke, try deep breathing.  Designate your home as a nonsmoking area.  If you are a heavy smoker, ask your health care provider about a prescription for nicotine chewing gum. It can ease your withdrawal from nicotine.  Reward yourself. Set aside the cigarette money you save and buy yourself something nice.  Look for support from others. Join a support group or smoking cessation program. Ask someone at home or at work to help you with your plan to quit smoking.  Always ask yourself, "Do I need this cigarette or is this just a reflex?" Tell yourself, "Today, I choose not to smoke," or "I do not want to smoke." You are reminding yourself of your decision to quit.  Do not replace cigarette smoking with electronic cigarettes (commonly called e-cigarettes). The safety of e-cigarettes is unknown, and some may contain harmful chemicals.  If you relapse, do not give up! Plan ahead and think about what you will do the next time you get the urge to smoke. HOW WILL I FEEL WHEN I QUIT SMOKING? You may have symptoms of withdrawal because your body is used to nicotine (the addictive substance in cigarettes). You may crave cigarettes, be irritable, feel very hungry, cough often, get headaches, or have difficulty concentrating. The withdrawal symptoms are only temporary. They are strongest when you first quit but will go away within 10-14 days. When withdrawal symptoms occur, stay in control. Think about your reasons for quitting. Remind yourself that these are signs that your body is healing and getting used to being without cigarettes. Remember that withdrawal symptoms are easier to treat than the major diseases that smoking can cause.  Even after the withdrawal is over, expect periodic urges to smoke. However, these cravings are generally short lived and will go away whether you   smoke or not. Do not smoke! WHAT RESOURCES ARE AVAILABLE TO HELP ME QUIT SMOKING? Your health care  provider can direct you to community resources or hospitals for support, which may include:  Group support.  Education.  Hypnosis.  Therapy. Document Released: 10/02/2003 Document Revised: 05/20/2013 Document Reviewed: 06/21/2012 ExitCare Patient Information 2015 [employer]. This information is not intended to replace advice given to you by your health care provider. Make sure you discuss any questions you have with your health care provider.  

## 2014-03-19 NOTE — Addendum Note (Signed)
Addended by: Sharee PimpleMCCHESNEY, MARILYN K on: 03/19/2014 11:06 AM   Modules accepted: Orders

## 2014-06-12 HISTORY — PX: IVC FILTER PLACEMENT (ARMC HX): HXRAD1551

## 2014-09-24 ENCOUNTER — Encounter: Payer: Self-pay | Admitting: Vascular Surgery

## 2014-10-13 ENCOUNTER — Encounter: Payer: Self-pay | Admitting: Vascular Surgery

## 2014-10-14 ENCOUNTER — Encounter: Payer: Self-pay | Admitting: Vascular Surgery

## 2014-10-14 ENCOUNTER — Ambulatory Visit (INDEPENDENT_AMBULATORY_CARE_PROVIDER_SITE_OTHER): Payer: BC Managed Care – PPO | Admitting: Vascular Surgery

## 2014-10-14 VITALS — BP 163/100 | HR 82 | Ht 68.5 in | Wt 166.3 lb

## 2014-10-14 DIAGNOSIS — I82409 Acute embolism and thrombosis of unspecified deep veins of unspecified lower extremity: Secondary | ICD-10-CM | POA: Insufficient documentation

## 2014-10-14 DIAGNOSIS — I82401 Acute embolism and thrombosis of unspecified deep veins of right lower extremity: Secondary | ICD-10-CM | POA: Diagnosis not present

## 2014-10-14 HISTORY — DX: Acute embolism and thrombosis of unspecified deep veins of unspecified lower extremity: I82.409

## 2014-10-14 NOTE — Progress Notes (Signed)
Subjective:     Patient ID: Aaron Cunningham, male   DOB: 1966-01-03, 49 y.o.   MRN: [id number]  HPI  This 49 year old male has a remote history of right iliac stent placed by Dr. Liliane Bade in 2007. He has been followed in our office most recently in March 2016 with a widely patent stent. He developed acute DVT of his right leg in May 2016 and suffered a saddle embolus-pulmonary embolus. This required thrombo-lysis. IVC filter was placed all of this at [facility]. Since then he was on Eliquis  Initially and developed hematuria.  The dose was reduced in half and he has done fairly well on that although he still has had occasional hematuria. He was evaluated by urology with no source for the bleeding noted. In August 2016 he developed a second DVT in the right leg below the knee despite taking the anticoagulate. He has had no further problems since that time and continues to take one half tablet of Eliquis  Daily.   Past Medical History  Diagnosis Date  . Peripheral vascular disease   . CAD (coronary artery disease)   . Bronchiolitis Feb. 2016  . Pneumonia Feb. 2016  . Anxiety   . Hyperlipidemia   . DVT (deep venous thrombosis)     Social History  Substance Use Topics  . Smoking status: Light Tobacco Smoker  . Smokeless tobacco: Never Used  . Alcohol Use: 0.6 oz/week    1 Glasses of wine per week    Family History  Problem Relation Age of Onset  . Deep vein thrombosis Father   . Heart disease Father 27    Before age 15  . Hyperlipidemia Father   . Heart attack Father   . Stroke Father     No Known Allergies   Current outpatient prescriptions:  .  apixaban (ELIQUIS) 5 MG TABS tablet, Take 2.5 mg by mouth daily. , Disp: , Rfl:  .  clonazePAM (KLONOPIN) 1 MG tablet, Take by mouth., Disp: , Rfl:  .  omeprazole (PRILOSEC) 20 MG capsule, Take 20 mg by mouth daily., Disp: , Rfl:  .  VENTOLIN HFA 108 (90 BASE) MCG/ACT inhaler, as needed., Disp: , Rfl: 0 .  aspirin 81 MG  tablet, Take 81 mg by mouth daily., Disp: , Rfl:   Filed Vitals:   10/14/14 1510 10/14/14 1513  BP: 160/103 163/100  Pulse: 82   Height: 5' 8.5" (1.74 m)   Weight: 166 lb 4.8 oz (75.433 kg)   SpO2: 95%     Body mass index is 24.92 kg/(m^2).          Review of Systems  Denies chest pain , dyspnea on exertion, PND, orthopnea, hemoptysis, claudication.     Objective:   Physical Exam BP 163/100 mmHg  Pulse 82  Ht 5' 8.5" (1.74 m)  Wt 166 lb 4.8 oz (75.433 kg)  BMI 24.92 kg/m2  SpO2 95%  Gen.-alert and oriented x3 in no apparent distress HEENT normal for age Lungs no rhonchi or wheezing Cardiovascular regular rhythm no murmurs carotid pulses 3+ palpable no bruits audible Abdomen soft nontender no palpable masses Musculoskeletal free of  major deformities Skin clear -no rashes Neurologic normal Lower extremities 3+ femoral and  Posterior tibial pulses palpable bilaterally with no edema on the left 1+ edema below the knee on the right. Both feet adequately perfused.    no vascular ab studies were done today. Patient did have triphasic flow in the right leg with ABI  exceeding 1.0 bilaterally in March 2016.  I did review the records supplied by Dr. Dina Rich       Assessment:      2 episodes right lower extremity deep vein thrombosis in May and August 2016. Initial episode was followed by pulmonary embolus and insertion of IVC filter at [facility]  Patient stable at the present time on chronic anticoagulation -half tablet of Eliquis  Daily   history of tobacco abuse  history of right iliac stent -widely patent     Plan:      would continue managing DVT and you are doing and would recommend chronic anticoagulation. If he continues to have episodes of hematuria on small dose of Eliquis--- would try another anticoagulative to see if this gives similar result  Patient to return to see [place] in 6 months for follow-up of right iliac stent

## 2015-01-07 ENCOUNTER — Inpatient Hospital Stay (HOSPITAL_COMMUNITY): Payer: BC Managed Care – PPO

## 2015-01-07 ENCOUNTER — Inpatient Hospital Stay: Admit: 2015-01-07 | Payer: Self-pay | Admitting: Vascular Surgery

## 2015-01-07 ENCOUNTER — Inpatient Hospital Stay (HOSPITAL_COMMUNITY)
Admission: EM | Admit: 2015-01-07 | Discharge: 2015-01-13 | DRG: 919 | Disposition: A | Discharge status: Home / Self Care | Payer: BC Managed Care – PPO | Attending: Vascular Surgery | Admitting: Vascular Surgery

## 2015-01-07 DIAGNOSIS — I739 Peripheral vascular disease, unspecified: Secondary | ICD-10-CM | POA: Diagnosis present

## 2015-01-07 DIAGNOSIS — Z86718 Personal history of other venous thrombosis and embolism: Secondary | ICD-10-CM

## 2015-01-07 DIAGNOSIS — I82493 Acute embolism and thrombosis of other specified deep vein of lower extremity, bilateral: Secondary | ICD-10-CM | POA: Diagnosis present

## 2015-01-07 DIAGNOSIS — I82220 Acute embolism and thrombosis of inferior vena cava: Secondary | ICD-10-CM | POA: Diagnosis present

## 2015-01-07 DIAGNOSIS — E875 Hyperkalemia: Secondary | ICD-10-CM | POA: Diagnosis present

## 2015-01-07 DIAGNOSIS — F419 Anxiety disorder, unspecified: Secondary | ICD-10-CM | POA: Diagnosis present

## 2015-01-07 DIAGNOSIS — F17200 Nicotine dependence, unspecified, uncomplicated: Secondary | ICD-10-CM | POA: Diagnosis present

## 2015-01-07 DIAGNOSIS — I472 Ventricular tachycardia: Secondary | ICD-10-CM | POA: Diagnosis not present

## 2015-01-07 DIAGNOSIS — I82403 Acute embolism and thrombosis of unspecified deep veins of lower extremity, bilateral: Secondary | ICD-10-CM | POA: Diagnosis not present

## 2015-01-07 DIAGNOSIS — I82409 Acute embolism and thrombosis of unspecified deep veins of unspecified lower extremity: Secondary | ICD-10-CM

## 2015-01-07 DIAGNOSIS — E785 Hyperlipidemia, unspecified: Secondary | ICD-10-CM | POA: Diagnosis present

## 2015-01-07 DIAGNOSIS — R000 Tachycardia, unspecified: Secondary | ICD-10-CM

## 2015-01-07 DIAGNOSIS — R079 Chest pain, unspecified: Secondary | ICD-10-CM | POA: Diagnosis not present

## 2015-01-07 DIAGNOSIS — N029 Recurrent and persistent hematuria with unspecified morphologic changes: Secondary | ICD-10-CM | POA: Diagnosis present

## 2015-01-07 DIAGNOSIS — Z823 Family history of stroke: Secondary | ICD-10-CM

## 2015-01-07 DIAGNOSIS — I824Z3 Acute embolism and thrombosis of unspecified deep veins of distal lower extremity, bilateral: Secondary | ICD-10-CM

## 2015-01-07 DIAGNOSIS — Z7901 Long term (current) use of anticoagulants: Secondary | ICD-10-CM | POA: Diagnosis not present

## 2015-01-07 DIAGNOSIS — R7989 Other specified abnormal findings of blood chemistry: Secondary | ICD-10-CM | POA: Diagnosis not present

## 2015-01-07 DIAGNOSIS — D696 Thrombocytopenia, unspecified: Secondary | ICD-10-CM | POA: Diagnosis present

## 2015-01-07 DIAGNOSIS — Y838 Other surgical procedures as the cause of abnormal reaction of the patient, or of later complication, without mention of misadventure at the time of the procedure: Secondary | ICD-10-CM | POA: Diagnosis present

## 2015-01-07 DIAGNOSIS — I248 Other forms of acute ischemic heart disease: Secondary | ICD-10-CM | POA: Diagnosis present

## 2015-01-07 DIAGNOSIS — T859XXA Unspecified complication of internal prosthetic device, implant and graft, initial encounter: Secondary | ICD-10-CM | POA: Diagnosis present

## 2015-01-07 DIAGNOSIS — Z8249 Family history of ischemic heart disease and other diseases of the circulatory system: Secondary | ICD-10-CM | POA: Diagnosis not present

## 2015-01-07 DIAGNOSIS — K219 Gastro-esophageal reflux disease without esophagitis: Secondary | ICD-10-CM | POA: Diagnosis present

## 2015-01-07 DIAGNOSIS — I2510 Atherosclerotic heart disease of native coronary artery without angina pectoris: Secondary | ICD-10-CM | POA: Diagnosis present

## 2015-01-07 DIAGNOSIS — Z79899 Other long term (current) drug therapy: Secondary | ICD-10-CM

## 2015-01-07 DIAGNOSIS — I80203 Phlebitis and thrombophlebitis of unspecified deep vessels of lower extremities, bilateral: Secondary | ICD-10-CM

## 2015-01-07 DIAGNOSIS — I998 Other disorder of circulatory system: Secondary | ICD-10-CM

## 2015-01-07 DIAGNOSIS — Z86711 Personal history of pulmonary embolism: Secondary | ICD-10-CM

## 2015-01-07 DIAGNOSIS — R0789 Other chest pain: Secondary | ICD-10-CM | POA: Diagnosis not present

## 2015-01-07 DIAGNOSIS — D6859 Other primary thrombophilia: Secondary | ICD-10-CM | POA: Diagnosis not present

## 2015-01-07 HISTORY — DX: Peripheral vascular disease, unspecified: I73.9

## 2015-01-07 HISTORY — DX: Gastro-esophageal reflux disease without esophagitis: K21.9

## 2015-01-07 HISTORY — DX: Phlebitis and thrombophlebitis of unspecified deep vessels of lower extremities, bilateral: I80.203

## 2015-01-07 LAB — CBC WITH DIFFERENTIAL/PLATELET
Basophils Absolute: 0 10*3/uL (ref 0.0–0.1)
Basophils Relative: 0 %
Eosinophils Absolute: 0 10*3/uL (ref 0.0–0.7)
Eosinophils Relative: 0 %
HEMATOCRIT: 56.3 % — AB (ref 39.0–52.0)
HEMOGLOBIN: 19.8 g/dL — AB (ref 13.0–17.0)
LYMPHS ABS: 1.1 10*3/uL (ref 0.7–4.0)
Lymphocytes Relative: 8 %
MCH: 37.5 pg — AB (ref 26.0–34.0)
MCHC: 35.2 g/dL (ref 30.0–36.0)
MCV: 106.6 fL — ABNORMAL HIGH (ref 78.0–100.0)
MONOS PCT: 7 %
Monocytes Absolute: 0.9 10*3/uL (ref 0.1–1.0)
NEUTROS ABS: 12 10*3/uL — AB (ref 1.7–7.7)
NEUTROS PCT: 85 %
Platelets: 72 10*3/uL — ABNORMAL LOW (ref 150–400)
RBC: 5.28 MIL/uL (ref 4.22–5.81)
RDW: 13.7 % (ref 11.5–15.5)
WBC: 14.1 10*3/uL — ABNORMAL HIGH (ref 4.0–10.5)

## 2015-01-07 LAB — BASIC METABOLIC PANEL
ANION GAP: 11 (ref 5–15)
BUN: 14 mg/dL (ref 6–20)
CHLORIDE: 103 mmol/L (ref 101–111)
CO2: 23 mmol/L (ref 22–32)
CREATININE: 1.79 mg/dL — AB (ref 0.61–1.24)
Calcium: 8.2 mg/dL — ABNORMAL LOW (ref 8.9–10.3)
GFR calc non Af Amer: 43 mL/min — ABNORMAL LOW (ref >60)
GFR, EST AFRICAN AMERICAN: 50 mL/min — AB (ref >60)
Glucose, Bld: 123 mg/dL — ABNORMAL HIGH (ref 65–99)
POTASSIUM: 5.8 mmol/L — AB (ref 3.5–5.1)
SODIUM: 137 mmol/L (ref 135–145)

## 2015-01-07 LAB — CBC
HEMATOCRIT: 49.8 % (ref 39.0–52.0)
HEMOGLOBIN: 17.5 g/dL — AB (ref 13.0–17.0)
MCH: 37.6 pg — ABNORMAL HIGH (ref 26.0–34.0)
MCHC: 35.1 g/dL (ref 30.0–36.0)
MCV: 106.9 fL — ABNORMAL HIGH (ref 78.0–100.0)
Platelets: 50 10*3/uL — ABNORMAL LOW (ref 150–400)
RBC: 4.66 MIL/uL (ref 4.22–5.81)
RDW: 13.8 % (ref 11.5–15.5)
WBC: 10.2 10*3/uL (ref 4.0–10.5)

## 2015-01-07 LAB — I-STAT CG4 LACTIC ACID, ED: Lactic Acid, Venous: 2.46 mmol/L (ref 0.5–2.0)

## 2015-01-07 LAB — I-STAT CHEM 8, ED
BUN: 15 mg/dL (ref 6–20)
CALCIUM ION: 1.08 mmol/L — AB (ref 1.12–1.23)
CREATININE: 1.4 mg/dL — AB (ref 0.61–1.24)
Chloride: 101 mmol/L (ref 101–111)
Glucose, Bld: 160 mg/dL — ABNORMAL HIGH (ref 65–99)
HEMATOCRIT: 62 % — AB (ref 39.0–52.0)
Hemoglobin: 21.1 g/dL (ref 13.0–17.0)
Potassium: 5.3 mmol/L — ABNORMAL HIGH (ref 3.5–5.1)
Sodium: 136 mmol/L (ref 135–145)
TCO2: 25 mmol/L (ref 0–100)

## 2015-01-07 LAB — COMPREHENSIVE METABOLIC PANEL
ALK PHOS: 91 U/L (ref 38–126)
ALT: 26 U/L (ref 17–63)
ANION GAP: 13 (ref 5–15)
AST: 25 U/L (ref 15–41)
Albumin: 4.5 g/dL (ref 3.5–5.0)
BILIRUBIN TOTAL: 2.2 mg/dL — AB (ref 0.3–1.2)
BUN: 10 mg/dL (ref 6–20)
CALCIUM: 8.6 mg/dL — AB (ref 8.9–10.3)
CO2: 24 mmol/L (ref 22–32)
Chloride: 100 mmol/L — ABNORMAL LOW (ref 101–111)
Creatinine, Ser: 1.6 mg/dL — ABNORMAL HIGH (ref 0.61–1.24)
GFR, EST AFRICAN AMERICAN: 57 mL/min — AB (ref >60)
GFR, EST NON AFRICAN AMERICAN: 49 mL/min — AB (ref >60)
GLUCOSE: 174 mg/dL — AB (ref 65–99)
Potassium: 5.4 mmol/L — ABNORMAL HIGH (ref 3.5–5.1)
Sodium: 137 mmol/L (ref 135–145)
TOTAL PROTEIN: 7.2 g/dL (ref 6.5–8.1)

## 2015-01-07 LAB — PROTIME-INR
INR: 1.19 (ref 0.00–1.49)
Prothrombin Time: 15.3 seconds — ABNORMAL HIGH (ref 11.6–15.2)

## 2015-01-07 LAB — SURGICAL PCR SCREEN
MRSA, PCR: NEGATIVE
STAPHYLOCOCCUS AUREUS: NEGATIVE

## 2015-01-07 LAB — TROPONIN I: Troponin I: 0.07 ng/mL — ABNORMAL HIGH (ref <0.031)

## 2015-01-07 LAB — TYPE AND SCREEN
ABO/RH(D): A POS
Antibody Screen: NEGATIVE

## 2015-01-07 LAB — ABO/RH: ABO/RH(D): A POS

## 2015-01-07 LAB — APTT: APTT: 165 s — AB (ref 24–37)

## 2015-01-07 LAB — HEPARIN LEVEL (UNFRACTIONATED): Heparin Unfractionated: 1.06 IU/mL — ABNORMAL HIGH (ref 0.30–0.70)

## 2015-01-07 MED ORDER — SODIUM CHLORIDE 0.9 % IV SOLN: INTRAVENOUS | Status: DC

## 2015-01-07 MED ORDER — SODIUM CHLORIDE 0.9 % IV SOLN
12.0000 mg | Freq: Once | INTRAVENOUS | Status: DC
  Filled 2015-01-07: qty 12

## 2015-01-07 MED ORDER — ONDANSETRON HCL 4 MG/2ML IJ SOLN: 4.0000 mg | Freq: Four times a day (QID) | INTRAMUSCULAR | Status: DC | PRN

## 2015-01-07 MED ORDER — PANTOPRAZOLE SODIUM 40 MG IV SOLR
40.0000 mg | INTRAVENOUS | Status: DC
  Administered 2015-01-08 – 2015-01-12 (×6): 40 mg via INTRAVENOUS
  Filled 2015-01-07 (×4): qty 40

## 2015-01-07 MED ORDER — LABETALOL HCL 5 MG/ML IV SOLN
10.0000 mg | INTRAVENOUS | Status: AC | PRN
  Administered 2015-01-07 (×4): 10 mg via INTRAVENOUS
  Filled 2015-01-07 (×2): qty 4

## 2015-01-07 MED ORDER — IOHEXOL 300 MG/ML  SOLN
100.0000 mL | Freq: Once | INTRAMUSCULAR | Status: AC | PRN
  Administered 2015-01-07: 40 mL via INTRAVENOUS

## 2015-01-07 MED ORDER — MIDAZOLAM HCL 2 MG/2ML IJ SOLN
INTRAMUSCULAR | Status: AC
  Filled 2015-01-07: qty 2

## 2015-01-07 MED ORDER — HEPARIN (PORCINE) IN NACL 100-0.45 UNIT/ML-% IJ SOLN
1750.0000 [IU]/h | INTRAMUSCULAR | Status: DC
  Administered 2015-01-07: 1200 [IU]/h via INTRAVENOUS
  Administered 2015-01-08: 1400 [IU]/h via INTRAVENOUS
  Administered 2015-01-09 – 2015-01-11 (×4): 1600 [IU]/h via INTRAVENOUS
  Administered 2015-01-11: 1750 [IU]/h via INTRAVENOUS
  Filled 2015-01-07 (×11): qty 250

## 2015-01-07 MED ORDER — MIDAZOLAM HCL 2 MG/2ML IJ SOLN
INTRAMUSCULAR | Status: AC | PRN
  Administered 2015-01-07: 0.5 mg via INTRAVENOUS

## 2015-01-07 MED ORDER — HEPARIN BOLUS VIA INFUSION
4000.0000 [IU] | Freq: Once | INTRAVENOUS | Status: DC
  Filled 2015-01-07: qty 4000

## 2015-01-07 MED ORDER — SODIUM CHLORIDE 0.9 % IJ SOLN
3.0000 mL | Freq: Two times a day (BID) | INTRAMUSCULAR | Status: DC
  Administered 2015-01-07 – 2015-01-08 (×3): 3 mL via INTRAVENOUS

## 2015-01-07 MED ORDER — PANTOPRAZOLE SODIUM 40 MG PO TBEC: 40.0000 mg | DELAYED_RELEASE_TABLET | Freq: Every day | ORAL | Status: DC

## 2015-01-07 MED ORDER — ACETAMINOPHEN 325 MG RE SUPP
325.0000 mg | RECTAL | Status: DC | PRN
  Filled 2015-01-07: qty 2

## 2015-01-07 MED ORDER — MORPHINE SULFATE (PF) 4 MG/ML IV SOLN
INTRAVENOUS | Status: AC
  Administered 2015-01-07: 2 mg
  Filled 2015-01-07: qty 1

## 2015-01-07 MED ORDER — SODIUM CHLORIDE 0.9 % IV SOLN
12.0000 mg | Freq: Once | INTRAVENOUS | Status: AC
  Administered 2015-01-07: 12 mg via INTRAVENOUS
  Filled 2015-01-07: qty 12

## 2015-01-07 MED ORDER — HYDROMORPHONE HCL 1 MG/ML IJ SOLN: 1.0000 mg | Freq: Once | INTRAMUSCULAR | Status: DC

## 2015-01-07 MED ORDER — PHENOL 1.4 % MT LIQD: 1.0000 | OROMUCOSAL | Status: DC | PRN

## 2015-01-07 MED ORDER — ALUM & MAG HYDROXIDE-SIMETH 200-200-20 MG/5ML PO SUSP: 15.0000 mL | ORAL | Status: DC | PRN

## 2015-01-07 MED ORDER — LIDOCAINE HCL 1 % IJ SOLN
INTRAMUSCULAR | Status: AC
  Filled 2015-01-07: qty 20

## 2015-01-07 MED ORDER — METOPROLOL TARTRATE 1 MG/ML IV SOLN: 2.0000 mg | INTRAVENOUS | Status: DC | PRN

## 2015-01-07 MED ORDER — SODIUM CHLORIDE 0.9 % IJ SOLN
3.0000 mL | INTRAMUSCULAR | Status: DC | PRN
  Administered 2015-01-08: 3 mL via INTRAVENOUS
  Filled 2015-01-07: qty 3

## 2015-01-07 MED ORDER — HYDRALAZINE HCL 20 MG/ML IJ SOLN
5.0000 mg | INTRAMUSCULAR | Status: DC | PRN
  Filled 2015-01-07: qty 1

## 2015-01-07 MED ORDER — SODIUM CHLORIDE 0.9 % IV SOLN
1.0000 g | Freq: Once | INTRAVENOUS | Status: AC
  Administered 2015-01-07: 1 g via INTRAVENOUS
  Filled 2015-01-07 (×2): qty 10

## 2015-01-07 MED ORDER — SODIUM CHLORIDE 0.9 % IV SOLN: 250.0000 mL | INTRAVENOUS | Status: DC | PRN

## 2015-01-07 MED ORDER — SODIUM CHLORIDE 0.9 % IJ SOLN: 3.0000 mL | INTRAMUSCULAR | Status: DC | PRN

## 2015-01-07 MED ORDER — GUAIFENESIN-DM 100-10 MG/5ML PO SYRP: 15.0000 mL | ORAL_SOLUTION | ORAL | Status: DC | PRN

## 2015-01-07 MED ORDER — FENTANYL CITRATE (PF) 100 MCG/2ML IJ SOLN
INTRAMUSCULAR | Status: AC | PRN
  Administered 2015-01-07 (×2): 25 ug via INTRAVENOUS

## 2015-01-07 MED ORDER — MORPHINE SULFATE (PF) 2 MG/ML IV SOLN
2.0000 mg | INTRAVENOUS | Status: DC | PRN
  Administered 2015-01-07: 2 mg via INTRAVENOUS
  Administered 2015-01-08: 4 mg via INTRAVENOUS
  Administered 2015-01-08: 2 mg via INTRAVENOUS
  Administered 2015-01-08 – 2015-01-09 (×5): 4 mg via INTRAVENOUS
  Administered 2015-01-10 – 2015-01-12 (×2): 2 mg via INTRAVENOUS
  Filled 2015-01-07: qty 2
  Filled 2015-01-07 (×2): qty 1
  Filled 2015-01-07: qty 2
  Filled 2015-01-07: qty 1
  Filled 2015-01-07: qty 2
  Filled 2015-01-07: qty 1
  Filled 2015-01-07 (×4): qty 2

## 2015-01-07 MED ORDER — MAGNESIUM HYDROXIDE 400 MG/5ML PO SUSP
30.0000 mL | Freq: Every day | ORAL | Status: DC | PRN
  Administered 2015-01-09 – 2015-01-10 (×2): 30 mL via ORAL
  Filled 2015-01-07 (×2): qty 30

## 2015-01-07 MED ORDER — OXYCODONE HCL 5 MG PO TABS
5.0000 mg | ORAL_TABLET | ORAL | Status: DC | PRN
  Administered 2015-01-07 – 2015-01-12 (×16): 10 mg via ORAL
  Filled 2015-01-07 (×16): qty 2

## 2015-01-07 MED ORDER — ACETAMINOPHEN 325 MG PO TABS
325.0000 mg | ORAL_TABLET | ORAL | Status: DC | PRN
  Administered 2015-01-11: 650 mg via ORAL
  Filled 2015-01-07: qty 2

## 2015-01-07 MED ORDER — POTASSIUM CHLORIDE CRYS ER 20 MEQ PO TBCR: 20.0000 meq | EXTENDED_RELEASE_TABLET | Freq: Once | ORAL | Status: DC

## 2015-01-07 MED ORDER — SODIUM CHLORIDE 0.9 % IV SOLN
INTRAVENOUS | Status: DC
  Filled 2015-01-07: qty 20

## 2015-01-07 MED ORDER — FENTANYL CITRATE (PF) 100 MCG/2ML IJ SOLN
INTRAMUSCULAR | Status: AC
  Filled 2015-01-07: qty 2

## 2015-01-07 MED ORDER — SODIUM CHLORIDE 0.9 % IV SOLN
INTRAVENOUS | Status: DC
  Administered 2015-01-07 – 2015-01-09 (×2): via INTRAVENOUS

## 2015-01-07 MED ORDER — BISACODYL 10 MG RE SUPP: 10.0000 mg | Freq: Every day | RECTAL | Status: DC | PRN

## 2015-01-07 NOTE — Sedation Documentation (Signed)
Patient is resting comfortably. 

## 2015-01-07 NOTE — Progress Notes (Addendum)
VASCULAR LAB PRELIMINARY  ARTERIAL  ABI completed:    RIGHT    LEFT    PRESSURE WAVEFORM  PRESSURE WAVEFORM  BRACHIAL 122 Triphasic  BRACHIAL 128 Triphasic   DP 110 Monophasic DP 112 Monophasic   AT   AT    PT 124 Monophasic PT 134 Biphasic    PER   PER    GREAT TOE  NA GREAT TOE  NA    RIGHT LEFT  ABI 0.97 1.05   VENOUS  Right:  DVT noted in the CFV, FV, Pop v, PTV, and Pero v.  SFJ is thrombosed but no evidence of superficial thrombosis past the SFJ.  No Baker's cyst.  Left: DVT noted in the CFV, FV, Pop v.  SFJ is thrombosed but no evidence of superficial thrombosis past the SFJ.  No Baker's cyst.   Cong Hightower, RVT 01/07/2015, 3:19 PM

## 2015-01-07 NOTE — Procedures (Signed)
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 No complication No blood loss. [facility].

## 2015-01-07 NOTE — Sedation Documentation (Signed)
Transferred back to 2S05 in bed  on CR monitor in SR. Awake and alert. In no distress. Report given to RN.

## 2015-01-07 NOTE — Sedation Documentation (Signed)
Pt in NSR. States pain is better.

## 2015-01-07 NOTE — Sedation Documentation (Signed)
Pt awake and states he is having chest pain and is nauseous. Pt is diaphoretic and VT on the monitor. Dr. Deanne CofferHassell notified. Pt remains prone.

## 2015-01-07 NOTE — Sedation Documentation (Signed)
Pt back in NSR. States pain is better.

## 2015-01-07 NOTE — H&P (Signed)
[facility] [place] HISTORY AND PHYSICAL   History of Present Illness:  Patient is a 49 y.o. male who presents for evaluation of sudden onset cool bluish colored legs around 11 am today.  Pt has a sense of fullness in both thighs.  Over that last 2 hours has developed some numbness and tingling in toes but no pain.  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  He has been on Eliquis but stopped this several days ago due to hematuria.  He had been on a regimen where he was on Eliquis for 5 days followed by 2 off days due to this hematuria which has been present for months.  He apparently had a full urologic eval which showed no cause for hematuria.  The patient was seen by his primary MD yesterday for back pain and a right ureteral stone without hydro was seen on CT.  Pt also has a remote history of right iliac stent in 2007.  His ABIs 9 months ago were normal.  He denies claudication symptoms.  Other medical problems include hyperlipidemia and anxiety which have been stable.  Past Medical History  Diagnosis Date  . Peripheral vascular disease   . CAD (coronary artery disease)   . Bronchiolitis Feb. 2016  . Pneumonia Feb. 2016  . Anxiety   . Hyperlipidemia   . DVT (deep venous thrombosis)     Past Surgical History  Procedure Laterality Date  . Thrombolysis Right Nov. 9, 2010    Lower Extrim.  . Hand surgery    . Rotator cuff repair    . Tonsillectomy      Social History Social History  Substance Use Topics  . Smoking status: Light Tobacco Smoker  . Smokeless tobacco: Never Used  . Alcohol Use: 0.6 oz/week    1 Glasses of wine per week    Family History Family History  Problem Relation Age of Onset  . Deep vein thrombosis Father   . Heart disease Father 40    Before age 1  . Hyperlipidemia Father   . Heart attack Father   . Stroke Father     Allergies  No Known Allergies   Current Facility-Administered Medications   Medication Dose Route Frequency Provider Last Rate Last Dose  . calcium gluconate 1 g in sodium chloride 0.9 % 100 mL IVPB  1 g Intravenous Once Melene Plan, DO      . heparin ADULT infusion 100 units/mL (25000 units/250 mL)  1,200 Units/hr Intravenous Continuous [name], RPH 12 mL/hr at 01/07/15 1347 1,200 Units/hr at 01/07/15 1347  . HYDROmorphone (DILAUDID) injection 1 mg  1 mg Intravenous Once Melene Plan, DO   Stopped at 01/07/15 1347   Current Outpatient Prescriptions  Medication Sig Dispense Refill  . apixaban (ELIQUIS) 5 MG TABS tablet Take 2.5 mg by mouth daily.     Marland Kitchen aspirin 81 MG tablet Take 81 mg by mouth daily.    . clonazePAM (KLONOPIN) 1 MG tablet Take by mouth.    Marland Kitchen omeprazole (PRILOSEC) 20 MG capsule Take 20 mg by mouth daily.    . VENTOLIN HFA 108 (90 BASE) MCG/ACT inhaler as needed.  0    ROS:   General:  No weight loss, Fever, chills  HEENT: No recent headaches, no nasal bleeding, no visual changes, no sore throat  Neurologic: No dizziness, blackouts, seizures. No recent symptoms of stroke or mini- stroke. No recent episodes of slurred  speech, or temporary blindness.  Cardiac: No recent episodes of chest pain/pressure, no shortness of breath at rest.  No shortness of breath with exertion.  Denies history of atrial fibrillation or irregular heartbeat  Vascular: No history of rest pain in feet.  No history of claudication.  No history of non-healing ulcer, No history of DVT   Pulmonary: No home oxygen, no productive cough, no hemoptysis,  No asthma or wheezing  Musculoskeletal:  [ ]  Arthritis, [x ] Low back pain,  [ ]  Joint pain  Hematologic:No history of hypercoagulable state.  No history of easy bleeding.  No history of anemia  Gastrointestinal: No hematochezia or melena,  No gastroesophageal reflux, no trouble swallowing  Urinary: [ ]  chronic Kidney disease, [ ]  on HD - [ ]  MWF or [ ]  TTHS, [ ]  Burning with urination, [ ]  Frequent urination, [ ]  Difficulty  urinating;   Skin: No rashes  Psychological: + history of anxiety,  No history of depression   Physical Examination  Filed Vitals:   01/07/15 1311 01/07/15 1315 01/07/15 1330  BP: 109/96 119/91 135/96  Pulse: 126 130   Temp: 97.8 F (36.6 C)    Resp: 11 23 10   Height: 5\' 9"  (1.753 m)    Weight: 170 lb (77.111 kg)    SpO2: 90% 100%     Body mass index is 25.09 kg/(m^2).  General:  Alert and oriented, no acute distress HEENT: Normal Neck: No JVD, 2+ carotid pulses Pulmonary: Clear to auscultation bilaterally Cardiac: Regular Rate and Rhythm Abdomen: Soft, non-tender, slightly distended, no mass, no scars Skin: No rash, bluish discoloration from lower abodomen all the way to feet, skin cool to touch entire leg, both legs are tight from the thigh down Extremity Pulses:  2+ radial, brachial, femoral,absent dorsalis pedis, posterior tibial pulses bilaterally, monophasic PT doppler bilaterally Musculoskeletal: No deformity, edema as mentioned above  Neurologic: Upper and lower extremity motor 5/5 and symmetric  DATA:    CBC    Component Value Date/Time   WBC 14.1* 01/07/2015 1319   RBC 5.28 01/07/2015 1319   HGB 21.1* 01/07/2015 1330   HCT 62.0* 01/07/2015 1330   PLT PENDING 01/07/2015 1319   MCV 106.6* 01/07/2015 1319   MCH 37.5* 01/07/2015 1319   MCHC 35.2 01/07/2015 1319   RDW 13.7 01/07/2015 1319   LYMPHSABS 1.1 01/07/2015 1319   MONOABS 0.9 01/07/2015 1319   EOSABS 0.0 01/07/2015 1319   BASOSABS 0.0 01/07/2015 1319     BMET    Component Value Date/Time   NA 136 01/07/2015 1330   K 5.3* 01/07/2015 1330   CL 101 01/07/2015 1330   CO2 24 01/07/2015 1319   GLUCOSE 160* 01/07/2015 1330   BUN 15 01/07/2015 1330   CREATININE 1.40* 01/07/2015 1330   CALCIUM 8.6* 01/07/2015 1319   GFRNONAA 49* 01/07/2015 1319   GFRAA 57* 01/07/2015 1319    ASSESSMENT:  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 Continue heparin  2. Discussed with Dr Deanne CofferHassell potential for thrombolysis and he will eval pt  3.  Venous duplex to see level cath will need to be started.  4.  Also baseline ABIs to assess perfusion 5.  Erythocytosis possibly reactive may need hematology consult if persists after hydration with IV fluid  Pt high risk for venous gangrene if progresses  Fabienne Brunsharles Selma Rodelo, MD [facility] [place] Office: [phone] Pager: [phone]

## 2015-01-07 NOTE — ED Notes (Addendum)
Per EMS- pt here from [facility]. Pt has recently been taken off of his blood thinners three days ago. Pt noticed his legs were purple this morning. CT scan showed no circulation to legs. MD Fields aware of patient and of patient arrival. 20G PIV to R Hand and 20G PIV to RAC. Pt had Heparin infusion started at 1220. Heparin is at 1200 an hour, Heparin currently stopped by EMS upon arrival. Pt received 5000 unit bolus of Heparin. Pt has had a total of 4mg  Dilauadid, last 1mg  dose one hour ago. Pt also received 25 phenergan, and 4 of Zofran.

## 2015-01-07 NOTE — Sedation Documentation (Signed)
Pt resting. Remains in NSR.

## 2015-01-07 NOTE — Sedation Documentation (Signed)
Pt awake and alert. In no distress. States chest pain is almost gone. Remains in NSR. Transferred back to bed. Waiting for TPA from pharmacy.

## 2015-01-07 NOTE — ED Provider Notes (Signed)
CSN: [id number]     Arrival date & time 01/07/15  1307 History   First MD Initiated Contact with Patient 01/07/15 1309     CC: bilateral leg pain   (Consider location/radiation/quality/duration/timing/severity/associated sxs/prior Treatment) Patient is a 49 y.o. male presenting with general illness. The history is provided by the patient.  Illness Severity:  Severe Onset quality:  Sudden Duration:  1 day Timing:  Constant Progression:  Unchanged Chronicity:  New Associated symptoms: myalgias   Associated symptoms: no abdominal pain, no chest pain, no congestion, no diarrhea, no fever, no headaches, no rash, no shortness of breath and no vomiting     49 yo M with a chief complaint of bilateral leg pain. Patient was in [facility] and was found to have sudden onset purple legs. CT scan concerning for a bilateral femoral thrombus. This case was discussed with Dr. Darrick Penna, vascular surgery and was transferred over here. Patient was started on a heparin drip though this was stopped by EMS on their arrival. Patient has a history of multiple PEs in the past and also on Eliquis however he was taken off of that for hematuria.  Past Medical History  Diagnosis Date  . Peripheral vascular disease   . CAD (coronary artery disease)   . Bronchiolitis Feb. 2016  . Pneumonia Feb. 2016  . Anxiety   . Hyperlipidemia   . DVT (deep venous thrombosis)    Past Surgical History  Procedure Laterality Date  . Thrombolysis Right Nov. 9, 2010    Lower Extrim.  . Hand surgery    . Rotator cuff repair    . Tonsillectomy     Family History  Problem Relation Age of Onset  . Deep vein thrombosis Father   . Heart disease Father 38    Before age 86  . Hyperlipidemia Father   . Heart attack Father   . Stroke Father    Social History  Substance Use Topics  . Smoking status: Light Tobacco Smoker  . Smokeless tobacco: Never Used  . Alcohol Use: 0.6 oz/week    1 Glasses of wine per week     Review of Systems  Constitutional: Negative for fever and chills.  HENT: Negative for congestion and facial swelling.   Eyes: Negative for discharge and visual disturbance.  Respiratory: Negative for shortness of breath.   Cardiovascular: Negative for chest pain and palpitations.  Gastrointestinal: Negative for vomiting, abdominal pain and diarrhea.  Musculoskeletal: Positive for myalgias and arthralgias.  Skin: Negative for color change and rash.  Neurological: Negative for tremors, syncope and headaches.  Psychiatric/Behavioral: Negative for confusion and dysphoric mood.      Allergies  Review of patient's allergies indicates no known allergies.  Home Medications   Prior to Admission medications   Medication Sig Start Date End Date Taking? Authorizing Provider  apixaban (ELIQUIS) 5 MG TABS tablet Take 2.5 mg by mouth every other day.  06/27/14  Yes Historical Provider, MD  omeprazole (PRILOSEC) 20 MG capsule Take 20 mg by mouth daily.   Yes Historical Provider, MD  VENTOLIN HFA 108 (90 BASE) MCG/ACT inhaler as needed. 03/07/14  Yes Historical Provider, MD   BP 114/101 mmHg  Pulse 106  Temp(Src) 97.8 F (36.6 C)  Resp 10  Ht  (1.753 m)  Wt 170 lb (77.111 kg)  BMI 25.09 kg/m2  SpO2 94% Physical Exam  Constitutional: He is oriented to person, place, and time. He appears well-developed and well-nourished.  HENT:  Head: Normocephalic and atraumatic.  Eyes: EOM are normal. Pupils are equal, round, and reactive to light.  Neck: Normal range of motion. Neck supple. No JVD present.  Cardiovascular: Normal rate and regular rhythm.  Exam reveals no gallop and no friction rub.   No murmur heard. Pulmonary/Chest: No respiratory distress. He has no wheezes.  Abdominal: He exhibits no distension. There is no tenderness. There is no rebound and no guarding.  Musculoskeletal: Normal range of motion.  bluish discoloration to bilateral lower extremities up to the umbilicus. Cap  Refill about 4 seconds. No noted palpable pulses. Cold to touch  Neurological: He is alert and oriented to person, place, and time.  Skin: No rash noted. No pallor.  Psychiatric: He has a normal mood and affect. His behavior is normal.  Nursing note and vitals reviewed.   ED Course  Procedures (including critical care time) Labs Review Labs Reviewed  PROTIME-INR - Abnormal; Notable for the following:    Prothrombin Time 15.3 (*)    All other components within normal limits  APTT - Abnormal; Notable for the following:    aPTT 165 (*)    All other components within normal limits  CBC WITH DIFFERENTIAL/PLATELET - Abnormal; Notable for the following:    WBC 14.1 (*)    Hemoglobin 19.8 (*)    HCT 56.3 (*)    MCV 106.6 (*)    MCH 37.5 (*)    Platelets 72 (*)    Neutro Abs 12.0 (*)    All other components within normal limits  COMPREHENSIVE METABOLIC PANEL - Abnormal; Notable for the following:    Potassium 5.4 (*)    Chloride 100 (*)    Glucose, Bld 174 (*)    Creatinine, Ser 1.60 (*)    Calcium 8.6 (*)    Total Bilirubin 2.2 (*)    GFR calc non Af Amer 49 (*)    GFR calc Af Amer 57 (*)    All other components within normal limits  I-STAT CHEM 8, ED - Abnormal; Notable for the following:    Potassium 5.3 (*)    Creatinine, Ser 1.40 (*)    Glucose, Bld 160 (*)    Calcium, Ion 1.08 (*)    Hemoglobin 21.1 (*)    HCT 62.0 (*)    All other components within normal limits  I-STAT CG4 LACTIC ACID, ED - Abnormal; Notable for the following:    Lactic Acid, Venous 2.46 (*)    All other components within normal limits  HEPARIN LEVEL (UNFRACTIONATED)  APTT  URINALYSIS, ROUTINE W REFLEX MICROSCOPIC (NOT AT [facility])  HEPARIN LEVEL (UNFRACTIONATED)  I-STAT CG4 LACTIC ACID, ED  TYPE AND SCREEN  ABO/RH    Imaging Review No results found. I have personally reviewed and evaluated these images and lab results as part of my medical decision-making.   EKG Interpretation   Date/Time:   Wednesday January 07 2015 13:10:18 EST Ventricular Rate:  121 PR Interval:  131 QRS Duration: 89 QT Interval:  333 QTC Calculation: 472 R Axis:   113 Text Interpretation:  Sinus tachycardia Otherwise no significant change  Confirmed by Kiwanna Spraker MD, Reuel BoomANIEL ([id number]) on 01/07/2015 1:45:37 PM      MDM   Final diagnoses:  Vascular occlusion    49 yo M with a chief complaint of bilateral leg pain. Patient is a transfer from [facility] with a known bilateral femoral artery occlusion. Patient with some Refill to bilateral lower extremities. Having significant amount of pain. Tachycardic into the 120s. Will give the patient  another milligram of Dilaudid restart heparin drip. Dr. Darrick Penna notified of patients arrival.   CRITICAL CARE Performed by: Rae Roam   Total critical care time: 35 minutes  Critical care time was exclusive of separately billable procedures and treating other patients.  Critical care was necessary to treat or prevent imminent or life-threatening deterioration.  Critical care was time spent personally by me on the following activities: development of treatment plan with patient and/or surrogate as well as nursing, discussions with consultants, evaluation of patient's response to treatment, examination of patient, obtaining history from patient or surrogate, ordering and performing treatments and interventions, ordering and review of laboratory studies, ordering and review of radiographic studies, pulse oximetry and re-evaluation of patient's condition.  The patients results and plan were reviewed and discussed.   Any x-rays performed were independently reviewed by myself.   Differential diagnosis were considered with the presenting HPI.  Medications  HYDROmorphone (DILAUDID) injection 1 mg (0 mg Intravenous Hold 01/07/15 1347)  heparin ADULT infusion 100 units/mL (25000 units/250 mL) (1,200 Units/hr Intravenous New Bag/Given 01/07/15 1347)  calcium gluconate 1 g  in sodium chloride 0.9 % 100 mL IVPB (1 g Intravenous Given 01/07/15 1603)  ondansetron (ZOFRAN) injection 4 mg (not administered)  alum & mag hydroxide-simeth (MAALOX/MYLANTA) 200-200-20 MG/5ML suspension 15-30 mL (not administered)  labetalol (NORMODYNE,TRANDATE) injection 10 mg (not administered)  hydrALAZINE (APRESOLINE) injection 5 mg (not administered)  metoprolol (LOPRESSOR) injection 2-5 mg (not administered)  guaiFENesin-dextromethorphan (ROBITUSSIN DM) 100-10 MG/5ML syrup 15 mL (not administered)  phenol (CHLORASEPTIC) mouth spray 1 spray (not administered)  0.9 %  sodium chloride infusion ( Intravenous Rate/Dose Change 01/07/15 1544)  acetaminophen (TYLENOL) tablet 325-650 mg (not administered)    Or  acetaminophen (TYLENOL) suppository 325-650 mg (not administered)  oxyCODONE (Oxy IR/ROXICODONE) immediate release tablet 5-10 mg (not administered)  morphine 2 MG/ML injection 2-4 mg (not administered)  magnesium hydroxide (MILK OF MAGNESIA) suspension 30 mL (not administered)  bisacodyl (DULCOLAX) suppository 10 mg (not administered)  pantoprazole (PROTONIX) injection 40 mg (not administered)    Filed Vitals:   01/07/15 1315 01/07/15 1330 01/07/15 1345 01/07/15 1415  BP: 119/91 135/96 126/101 114/101  Pulse: 130  115 106  Temp:      Resp: Height:      Weight:      SpO2: 100%  97% 94%    Final diagnoses:  Vascular occlusion    Admission/ observation were discussed with the admitting physician, patient and/or family and they are comfortable with the plan.     Melene Plan, DO 01/07/15 [phone]

## 2015-01-07 NOTE — Progress Notes (Addendum)
ANTICOAGULATION CONSULT NOTE - Initial Consult  Pharmacy Consult for Heparin Indication: acute aortic thrombus  No Known Allergies  Patient Measurements: Height: 5\' 9"  (175.3 cm) Weight: 170 lb (77.111 kg) IBW/kg (Calculated) : 70.7 Heparin Dosing Weight: 77.1 kg  Vital Signs: Temp: 97.8 F (36.6 C) (12/21 1311) BP: 109/96 mmHg (12/21 1311) Pulse Rate: 126 (12/21 1311)  Labs: No results for input(s): HGB, HCT, PLT, APTT, LABPROT, INR, HEPARINUNFRC, CREATININE, CKTOTAL, CKMB, TROPONINI in the last 72 hours.  CrCl cannot be calculated (Patient has no serum creatinine result on file.).   Medical History: Past Medical History  Diagnosis Date  . Peripheral vascular disease   . CAD (coronary artery disease)   . Bronchiolitis Feb. 2016  . Pneumonia Feb. 2016  . Anxiety   . Hyperlipidemia   . DVT (deep venous thrombosis)     Medications:  Scheduled:  .  HYDROmorphone (DILAUDID) injection  1 mg Intravenous Once   Infusions:   PRN:   Assessment: Aaron Cunningham presenting from [facility] with acute aortic thrombus in legs.  Per RN note pt received 5000 unit bolus of heparin at [place] and started on heparin 1200 units/hr IV infusion that was stopped by EMS upon arrival.  Hgb 21.1, PLTs pending   PTA: Apixaban (d/c ~3 days ago per RN note)  Goal of Therapy:  Heparin level 0.3-0.7 units/ml Monitor platelets by anticoagulation protocol: Yes   Plan:  Heparin gtt 1200 units/hr Check baseline HL/APTT 6 hr HL/APTT Daily HL/CBC Monitor for s/sx of bleeding   Aaron Cunningham 01/07/2015,1:25 PM  Addendum Pharmacy reconsulted to dose heparin for VTE.  Continue current dose of heparin.  Heparin gtt 1200 units/hr Check baseline HL/APTT 6 hr HL/APTT Daily HL/CBC Monitor for s/sx of bleeding

## 2015-01-07 NOTE — ED Notes (Signed)
Patient transported to Vascular Ultrasound °

## 2015-01-07 NOTE — Sedation Documentation (Signed)
Pt is back in VT. Dr. Deanne CofferHassell aware. Procedure stopped.

## 2015-01-07 NOTE — Progress Notes (Signed)
Patient returned from IR and report given to bedside RN. Patient observed to be in ventricular bigeminy on the monitor. BP stable and patient with no complaints of pain. Bilateral PT pulses present and marked. Lower extremities with less cyanosis and cool. Sheath sites are clean and dry. IR to set up TPA through sheaths. MD paged and updated about procedure, patient's arrhythmias during procedure, and patient's current arrhythmia. Orders received for cardiac enzymes and repeat BMET. No cardiology consult desired by MD at this time. MD informed of full bladder and inability to empty bladder. Orders received for 3 way Foley placement due to hematuria presence and possible need for continuous bladder irrigation. Will continue to monitor patient closely.

## 2015-01-07 NOTE — Progress Notes (Signed)
Chief Complaint: Patient was seen in consultation today for IVC and LE DVT at the request of Dr. Darrick Penna  Referring Physician(s): Dr. Fabienne Bruns  History of Present Illness: Aaron Cunningham is a 49 y.o. male with hx fo DVT abd PE about 9 months ago, had UVC filter placed and has been on Eliquis. Has had a few bouts of hematuria over the past few months and usually stops or adjusts his Eliquis for a few days until it resolves. He stopped it a few days ago and this morning developed significant swelling and cold bluish discoloration of both of his legs. He does have prior hx of PAD with a right iliac stent a few years ago too. 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 He is transferred to [place] for further eval. Dr. Darrick Penna has assessed pt and consulted IR for consideration of thrombolytic therapy. PMHx, meds, labs, imaging reviewed. LE duplex revealed acute DVT from pelvis all the way to bilateral popliteal veins as well. Pt has been NPO all day  Past Medical History  Diagnosis Date  . Peripheral vascular disease   . CAD (coronary artery disease)   . Bronchiolitis Feb. 2016  . Pneumonia Feb. 2016  . Anxiety   . Hyperlipidemia   . DVT (deep venous thrombosis)     Past Surgical History  Procedure Laterality Date  . Thrombolysis Right Nov. 9, 2010    Lower Extrim.  . Hand surgery    . Rotator cuff repair    . Tonsillectomy      Allergies: Review of patient's allergies indicates no known allergies.  Medications: Prior to Admission medications   Medication Sig Start Date End Date Taking? Authorizing Provider  apixaban (ELIQUIS) 5 MG TABS tablet Take 2.5 mg by mouth every other day.  06/27/14  Yes Historical Provider, MD  omeprazole (PRILOSEC) 20 MG capsule Take 20 mg by mouth daily.   Yes Historical Provider, MD  VENTOLIN HFA 108 (90 BASE) MCG/ACT inhaler as needed. 03/07/14  Yes Historical Provider, MD     Family History  Problem Relation Age of Onset  . Deep vein thrombosis Father   . Heart disease Father 22    Before age 58  . Hyperlipidemia Father   . Heart attack Father   . Stroke Father     Social History   Social History  . Marital Status: Married    Spouse Name: N/A  . Number of Children: N/A  . Years of Education: N/A   Social History Main Topics  . Smoking status: Light Tobacco Smoker  . Smokeless tobacco: Never Used  . Alcohol Use: 0.6 oz/week    1 Glasses of wine per week  . Drug Use: No  . Sexual Activity: Not on file   Other Topics Concern  . Not on file   Social History Narrative    Review of Systems: A 12 point ROS discussed and pertinent positives are indicated in the HPI above.  All other systems are negative.  Review of Systems  Vital Signs: BP 114/101 mmHg  Pulse 106  Temp(Src) 97.8 F (36.6 C)  Resp 10  Ht  (1.753 m)  Wt 170 lb (77.111 kg)  BMI 25.09 kg/m2  SpO2 94%  Physical Exam  Constitutional: He is oriented to person, place, and time. He appears well-developed and well-nourished. No distress.  HENT:  Head: Normocephalic.  Mouth/Throat: Oropharynx is clear and  moist.  Neck: Normal range of motion. No JVD present. No tracheal deviation present.  Cardiovascular: Normal rate, regular rhythm and normal heart sounds.   Pulmonary/Chest: Effort normal and breath sounds normal. No respiratory distress.  Abdominal: Soft. He exhibits no mass. There is no tenderness.  Musculoskeletal:  Bilateral LE with thigh and calf edema, Bluish discoloration and feet cool to touch Femoral and pedal pulses are palpable through edema  Neurological: He is alert and oriented to person, place, and time.  Psychiatric: He has a normal mood and affect. Judgment normal.    Mallampati Score:  MD Evaluation Airway: WNL Heart: WNL Abdomen: WNL Chest/ Lungs: WNL ASA  Classification: 3 Mallampati/Airway Score: One  Imaging: No results  found.  Labs:  CBC:  Recent Labs  01/07/15 1319 01/07/15 1330  WBC 14.1*  --   HGB 19.8* 21.1*  HCT 56.3* 62.0*  PLT 72*  --     COAGS:  Recent Labs  01/07/15 1319  INR 1.19  APTT 165*    BMP:  Recent Labs  01/07/15 1319 01/07/15 1330  NA 137 136  K 5.4* 5.3*  CL 100* 101  CO2 24  --   GLUCOSE 174* 160*  BUN 10 15  CALCIUM 8.6*  --   CREATININE 1.60* 1.40*  GFRNONAA 49*  --   GFRAA 57*  --     LIVER FUNCTION TESTS:  Recent Labs  01/07/15 1319  BILITOT 2.2*  AST 25  ALT 26  ALKPHOS 91  PROT 7.2  ALBUMIN 4.5    Assessment and Plan: Acute thrombosis of (B)LE and IVC up to filter Plan for venogram with initiation of thrombolysis Labs reviewed, AKI noted Risks and Benefits discussed with the patient including, but not limited to bleeding/hemorrhage from TPA use, infection, vascular injury or contrast induced renal failure. All of the patient's questions were answered, patient is agreeable to proceed. Consent signed and in chart.   Thank you for this interesting consult.   A copy of this report was sent to the requesting provider on this date.  SignedBrayton El: Reverie Vaquera 01/07/2015, 4:22 PM   I spent a total of 20 minutes in face to face in clinical consultation, greater than 50% of which was counseling/coordinating care for IVC and LE DVT lysis

## 2015-01-08 ENCOUNTER — Inpatient Hospital Stay (HOSPITAL_COMMUNITY): Payer: BC Managed Care – PPO

## 2015-01-08 ENCOUNTER — Encounter (HOSPITAL_COMMUNITY): Payer: Self-pay | Admitting: Cardiology

## 2015-01-08 DIAGNOSIS — R079 Chest pain, unspecified: Secondary | ICD-10-CM

## 2015-01-08 DIAGNOSIS — Z86711 Personal history of pulmonary embolism: Secondary | ICD-10-CM

## 2015-01-08 DIAGNOSIS — I472 Ventricular tachycardia: Secondary | ICD-10-CM

## 2015-01-08 DIAGNOSIS — I80203 Phlebitis and thrombophlebitis of unspecified deep vessels of lower extremities, bilateral: Secondary | ICD-10-CM

## 2015-01-08 DIAGNOSIS — R7989 Other specified abnormal findings of blood chemistry: Secondary | ICD-10-CM

## 2015-01-08 DIAGNOSIS — D696 Thrombocytopenia, unspecified: Secondary | ICD-10-CM

## 2015-01-08 DIAGNOSIS — I739 Peripheral vascular disease, unspecified: Secondary | ICD-10-CM

## 2015-01-08 LAB — URINALYSIS, ROUTINE W REFLEX MICROSCOPIC
Glucose, UA: 100 mg/dL — AB
NITRITE: POSITIVE — AB
Specific Gravity, Urine: 1.01 (ref 1.005–1.030)
pH: 5.5 (ref 5.0–8.0)

## 2015-01-08 LAB — FIBRINOGEN
FIBRINOGEN: 250 mg/dL (ref 204–475)
Fibrinogen: 286 mg/dL (ref 204–475)
Fibrinogen: 305 mg/dL (ref 204–475)

## 2015-01-08 LAB — HEPARIN LEVEL (UNFRACTIONATED)
HEPARIN UNFRACTIONATED: 0.3 [IU]/mL (ref 0.30–0.70)
HEPARIN UNFRACTIONATED: 0.31 [IU]/mL (ref 0.30–0.70)
HEPARIN UNFRACTIONATED: 0.18 [IU]/mL — AB (ref 0.30–0.70)
Heparin Unfractionated: 0.28 IU/mL — ABNORMAL LOW (ref 0.30–0.70)
Heparin Unfractionated: 0.27 IU/mL — ABNORMAL LOW (ref 0.30–0.70)

## 2015-01-08 LAB — URINE MICROSCOPIC-ADD ON

## 2015-01-08 LAB — CBC
HCT: 45.4 % (ref 39.0–52.0)
HCT: 47.6 % (ref 39.0–52.0)
HEMATOCRIT: 42.1 % (ref 39.0–52.0)
Hemoglobin: 14 g/dL (ref 13.0–17.0)
Hemoglobin: 15.2 g/dL (ref 13.0–17.0)
Hemoglobin: 15.9 g/dL (ref 13.0–17.0)
MCH: 36.1 pg — AB (ref 26.0–34.0)
MCH: 36.1 pg — AB (ref 26.0–34.0)
MCH: 36.3 pg — ABNORMAL HIGH (ref 26.0–34.0)
MCHC: 33.3 g/dL (ref 30.0–36.0)
MCHC: 33.4 g/dL (ref 30.0–36.0)
MCHC: 33.5 g/dL (ref 30.0–36.0)
MCV: 107.9 fL — ABNORMAL HIGH (ref 78.0–100.0)
MCV: 108.4 fL — ABNORMAL HIGH (ref 78.0–100.0)
MCV: 108.5 fL — AB (ref 78.0–100.0)
PLATELETS: 44 10*3/uL — AB (ref 150–400)
PLATELETS: 46 10*3/uL — AB (ref 150–400)
PLATELETS: 48 10*3/uL — AB (ref 150–400)
RBC: 3.88 MIL/uL — ABNORMAL LOW (ref 4.22–5.81)
RBC: 4.19 MIL/uL — ABNORMAL LOW (ref 4.22–5.81)
RBC: 4.41 MIL/uL (ref 4.22–5.81)
RDW: 13.8 % (ref 11.5–15.5)
RDW: 13.9 % (ref 11.5–15.5)
RDW: 14 % (ref 11.5–15.5)
WBC: 6.7 10*3/uL (ref 4.0–10.5)
WBC: 8.2 10*3/uL (ref 4.0–10.5)
WBC: 8.6 10*3/uL (ref 4.0–10.5)

## 2015-01-08 LAB — BASIC METABOLIC PANEL
ANION GAP: 9 (ref 5–15)
BUN: 16 mg/dL (ref 6–20)
CALCIUM: 7.8 mg/dL — AB (ref 8.9–10.3)
CO2: 22 mmol/L (ref 22–32)
Chloride: 106 mmol/L (ref 101–111)
Creatinine, Ser: 1.74 mg/dL — ABNORMAL HIGH (ref 0.61–1.24)
GFR, EST AFRICAN AMERICAN: 51 mL/min — AB (ref >60)
GFR, EST NON AFRICAN AMERICAN: 44 mL/min — AB (ref >60)
Glucose, Bld: 119 mg/dL — ABNORMAL HIGH (ref 65–99)
Potassium: 4.5 mmol/L (ref 3.5–5.1)
Sodium: 137 mmol/L (ref 135–145)

## 2015-01-08 LAB — TROPONIN I
TROPONIN I: 0.52 ng/mL — AB (ref <0.031)
TROPONIN I: 0.64 ng/mL — AB (ref <0.031)

## 2015-01-08 MED ORDER — MIDAZOLAM HCL 2 MG/2ML IJ SOLN
INTRAMUSCULAR | Status: AC | PRN
  Administered 2015-01-08: 1 mg via INTRAVENOUS

## 2015-01-08 MED ORDER — FENTANYL CITRATE (PF) 100 MCG/2ML IJ SOLN
INTRAMUSCULAR | Status: AC | PRN
  Administered 2015-01-08: 50 ug via INTRAVENOUS

## 2015-01-08 MED ORDER — IOHEXOL 300 MG/ML  SOLN
150.0000 mL | Freq: Once | INTRAMUSCULAR | Status: AC | PRN
  Administered 2015-01-08: 150 mL via INTRAVENOUS

## 2015-01-08 MED ORDER — FENTANYL CITRATE (PF) 100 MCG/2ML IJ SOLN
INTRAMUSCULAR | Status: AC
  Filled 2015-01-08: qty 6

## 2015-01-08 MED ORDER — MIDAZOLAM HCL 2 MG/2ML IJ SOLN
INTRAMUSCULAR | Status: AC
  Filled 2015-01-08: qty 6

## 2015-01-08 MED ORDER — MORPHINE SULFATE (PF) 2 MG/ML IV SOLN: 2.0000 mg | Freq: Once | INTRAVENOUS | Status: DC

## 2015-01-08 NOTE — Progress Notes (Signed)
  Echocardiogram 2D Echocardiogram has been performed.  Janalyn HarderWest, Aaron Cunningham 01/08/2015, 3:08 PM

## 2015-01-08 NOTE — Progress Notes (Signed)
Utilization Review Completed.  

## 2015-01-08 NOTE — Progress Notes (Addendum)
ANTICOAGULATION CONSULT NOTE - Follow Up Consult  Pharmacy Consult for heparin Indication: acute thrombosis w/ EKOS  Labs:  Recent Labs  01/07/15 1319 01/07/15 1330 01/07/15 1918 01/08/15 0017  HGB 19.8* 21.1* 17.5* 15.9  HCT 56.3* 62.0* 49.8 47.6  PLT 72*  --  50* 46*  APTT 165*  --   --   --   LABPROT 15.3*  --   --   --   INR 1.19  --   --   --   HEPARINUNFRC 1.06*  --   --  0.18*  CREATININE 1.60* 1.40* 1.79*  --   TROPONINI  --   --  0.07* 0.52*    Assessment: 49yo male subtherapeutic on heparin with initial dosing for acute thrombosis, currently on EKOS; thrombocytopenia noted, plt 72 on admission, now 46, trending similar to rest of CBC.  Goal of Therapy:  Heparin level 0.3-0.7 units/ml   Plan:  Will increase heparin gtt by 2-3 units/kg/hr to 1400 units/hr and check level with next Q6H labs (will be early).  Vernard GamblesVeronda Laquan Ludden, PharmD, BCPS  01/08/2015,1:23 AM   ADDENDUM: Next heparin level remains slightly subtherapeutic at 0.28 but lab was drawn just 4hr after rate change so will not change rate for now and recheck w/ next Q6H labs.  Plt 48, slightly up from last CBC. VB   01/08/2015 7:23 AM

## 2015-01-08 NOTE — Progress Notes (Signed)
ANTICOAGULATION CONSULT NOTE - Follow Up Consult  Pharmacy Consult for heparin Indication: acute thrombosis w/ EKOS  Labs:  Recent Labs  01/07/15 1319 01/07/15 1330 01/07/15 1918 01/08/15 0017 01/08/15 0321 01/08/15 0542 01/08/15 1214 01/08/15 2121  HGB 19.8* 21.1* 17.5* 15.9  --  15.2 14.0  --   HCT 56.3* 62.0* 49.8 47.6  --  45.4 42.1  --   PLT 72*  --  50* 46*  --  48* 44*  --   APTT 165*  --   --   --   --   --   --   --   LABPROT 15.3*  --   --   --   --   --   --   --   INR 1.19  --   --   --   --   --   --   --   HEPARINUNFRC 1.06*  --   --  0.18*  --  0.28* 0.30  0.27* 0.31  CREATININE 1.60* 1.40* 1.79*  --  1.74*  --   --   --   TROPONINI  --   --  0.07* 0.52*  --  0.64*  --   --     Assessment: 49yo male continues on IV heparin s/p EKOS. Platelets are low at 44 but have been stable the last few lab draws.   HL now therapeutic x1 (0.31) on 1550 units/h but at bottom of range. Will increase slightly to 1600 units/h. No bleed/IV line issues per RN.   Goal of Therapy:  Heparin level 0.3-0.7 units/ml   Plan:  -Increase heparin slightly to 1600 units/h to keep in range -6h HL to confirm -Daily HL/CBC -Mon s/sx bleeding  Babs BertinHaley Farzana Koci, PharmD, BCPS Clinical Pharmacist Pager [phone] 01/08/2015 11:08 PM

## 2015-01-08 NOTE — Progress Notes (Signed)
ANTICOAGULATION CONSULT NOTE - Follow Up Consult  Pharmacy Consult for heparin Indication: acute thrombosis w/ EKOS  Labs:  Recent Labs  01/07/15 1319 01/07/15 1330 01/07/15 1918 01/08/15 0017 01/08/15 0321 01/08/15 0542 01/08/15 1214  HGB 19.8* 21.1* 17.5* 15.9  --  15.2 14.0  HCT 56.3* 62.0* 49.8 47.6  --  45.4 42.1  PLT 72*  --  50* 46*  --  48* 44*  APTT 165*  --   --   --   --   --   --   LABPROT 15.3*  --   --   --   --   --   --   INR 1.19  --   --   --   --   --   --   HEPARINUNFRC 1.06*  --   --  0.18*  --  0.28* 0.27*  CREATININE 1.60* 1.40* 1.79*  --  1.74*  --   --   TROPONINI  --   --  0.07* 0.52*  --  0.64*  --     Assessment: 49yo male continues on IV heparin s/p EKOS and heparin level remains low at 0.27. H/H stable. Platelets are low at 44 but have been stable the last few lab draws.   Goal of Therapy:  Heparin level 0.3-0.7 units/ml   Plan:  - Increase heparin gtt to 1550 units/hr - Check an 8 hour heparin level - Daily heparin level and CBC  Lysle Pearlachel Derak Schurman, PharmD, BCPS Pager # [phone] 01/08/2015 1:17 PM

## 2015-01-08 NOTE — Procedures (Signed)
Interventional Radiology Procedure Note  Procedure: Lysis check.  No continuation of lysis.  Bilateral pop sheaths pulled.  Complications: None Recommendations:  -  Continue anti-coagulation - compression dressings overnight on popliteal region.   - VIR office visit in 2 - 4 weeks after discharge.    Signed,  Yvone NeuJaime S. Loreta AveWagner, DO

## 2015-01-08 NOTE — Progress Notes (Signed)
Pt transported to 2S05, Kerlix applied at bedside by Arther DamesLeslie RN and Inez Catalinaara RN. Bilateral sites clean, dry and intact   Victorino DikeLeslie Layne Lebon RN

## 2015-01-08 NOTE — Progress Notes (Addendum)
Progress Note    01/08/2015 7:52 AM Hospital Day 1  Subjective:  C/o back pain.  States he had CP yesterday during procedure  Tm 99.2 now afebrile HR 60's-90's NSR 100's-190's systolic  (170's-190's 5-7pm) 76% 2LO2NC  Filed Vitals:   01/08/15 0630 01/08/15 0700  BP: 120/81 108/76  Pulse: 67 65  Temp:    Resp: 7 7    Physical Exam: Cardiac:  Regular Lungs:  CTAB Abdomen:  Soft, NT Extremities:  Cyanosis greatly improved as well as tightness BLE; continues to have doppler DP/PT signals bilaterally; swelling of left hand from blood draw-no swelling left arm.  CBC    Component Value Date/Time   WBC 8.2 01/08/2015 0542   RBC 4.19* 01/08/2015 0542   HGB 15.2 01/08/2015 0542   HCT 45.4 01/08/2015 0542   PLT 48* 01/08/2015 0542   MCV 108.4* 01/08/2015 0542   MCH 36.3* 01/08/2015 0542   MCHC 33.5 01/08/2015 0542   RDW 13.9 01/08/2015 0542   LYMPHSABS 1.1 01/07/2015 1319   MONOABS 0.9 01/07/2015 1319   EOSABS 0.0 01/07/2015 1319   BASOSABS 0.0 01/07/2015 1319    BMET    Component Value Date/Time   NA 137 01/08/2015 0321   K 4.5 01/08/2015 0321   CL 106 01/08/2015 0321   CO2 22 01/08/2015 0321   GLUCOSE 119* 01/08/2015 0321   BUN 16 01/08/2015 0321   CREATININE 1.74* 01/08/2015 0321   CALCIUM 7.8* 01/08/2015 0321   GFRNONAA 44* 01/08/2015 0321   GFRAA 51* 01/08/2015 0321    INR    Component Value Date/Time   INR 1.19 01/07/2015 1319     Intake/Output Summary (Last 24 hours) at 01/08/15 0752 Last data filed at 01/08/15 0600  Gross per 24 hour  Intake 2486.26 ml  Output   1150 ml  Net 1336.26 ml     Assessment/Plan:  49 y.o. 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   -Thrombolysis per IR -pt with increasing troponin-he denies chest pain this morning, but states during  the procedure yesterday, he did have chest pain.  Will get a cardiology consult.  He has seen a cardiologist in [facility] in the past.  (Dr. Joyce Gross).  Spoke with Trish.  Pt is on heparin. -Thrombocytopenia-plt count on admit was 72k and fell to 46k at MN and 48k this morning.  Pharmacy monitoring blood count. -creatinine continues to be elevated, but down from last evening at 1.74 from 1.79 -hyperkalemia of 5.8 is down to 4.5 this morning. -erythrocytosis improved with IV hydration (hgb down to 15.2 from 21.1) -continues to have hematuria-now with foley  -U/A with +nitrites and large leukocytes-will send culture before treating for now -swelling left hand from blood draw-warm compresses and wedding ring moved to 5th finger -keep in ICU    Doreatha Massed, PA-C [facility] [phone] 01/08/2015 7:52 AM  Legs much better feet viable and warm with doppler signals and much less swollen, f/u IR today Slightly elevated creatinine trend for now continue hydration Hematuria chronic trend hemoglobin may need further urologic eval follow urine output and do bladder irrigation if decreased due to obstruction Will have cardiology eval in face of elevated troponin and ventricular arrhythmia during procedure yesterday.  Pt is fully anticoagulated without EKG changes so most likely observation for now with further workup  down the road  Polycythemia seems to be improving with hydration trend for now

## 2015-01-08 NOTE — Progress Notes (Signed)
CRITICAL VALUE ALERT  Critical value received:  Troponin 0.52  Date of notification:  01/08/2015  Time of notification: 0100  Critical value read back:Yes.    Nurse who received alert:  Viral Schramm, Lollie MarrowWilliam Darnell  MD notified (1st page):  Fields  Time of first page:  0110  MD notified (2nd page):  Time of second page:  Responding MD:  Darrick PennaFields  Time MD responded:  [phone]

## 2015-01-08 NOTE — Consult Note (Addendum)
Admit date: 01/07/2015 Referring Physician  Dr. Darrick Penna Primary Physician DOUGH,ROBERT, MD Primary Cardiologist  Dr. Gwen Pounds in [facility] Reason for Consultation  chest pain, abnormal troponin, ventricular arrhythmia during procedure  HPI: 49 year old male with history of peripheral vascular disease who on 01/07/15 presented with sudden onset pain of his legs, bluish discoloration, fullness. 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 Full urologic evaluation was performed that was unremarkable. He has also seen a hematologist as well he states.  Has a history of right iliac stent 2007. ABIs 9 months ago normal. No claudication. He denies coronary artery disease history. No prior cardiac catheterization.  he stated that he had a stress test several years ago.    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 Was taken to venogram with initiation thrombolysis by interventional radiology.  During this procedure at 5:29 PM on 12/21 a nursing note recorded states that he was having chest pain and was nauseous. He was diaphoretic and ventricular tachycardia was noted on the monitor. Dr. Deanne Coffer was notified. 3 minutes later a documented note states that he is in normal sinus rhythm. 5 minutes after this, a note states that he is back in [place]. 2 minutes later, back in normal sinus rhythm.  Summary of procedure demonstrates extensive occlusive bilateral iliac and caval thrombosis through IVC filter to the level of renal vein inflows. Partially occlusive bilateral extremity femoropopliteal DVT. Partial clearance with AngioJet. TPA infusion.  On 01/08/15 at 7:52 AM, complained of back pain and states that he had chest pain yesterday during the procedure. His legs are much improved. Overnight, troponin was drawn ,  0.07, 0.52, 0.64, mildly elevated. Thrombocytopenia also noted with platelets dropping from 72,000 down to 48,000. On heparin. Creatinine 1.74.  Thankfully, telemetry strips were recorded during procedure. I personally reviewed these and there is a wide complex tachycardia that is slightly irregularly irregular and during a brief pause, what appears to be atrial flutter waves or atrial arrhythmia is present.   Currently without chest pain resting comfortably in bed. No shortness of breath.  PMH:   Past Medical History  Diagnosis Date  . Peripheral vascular disease   . CAD (coronary artery disease)   . Bronchiolitis Feb. 2016  . Pneumonia Feb. 2016  . Anxiety   . Hyperlipidemia   . DVT (deep venous thrombosis)     PSH:   Past Surgical History  Procedure Laterality Date  . Thrombolysis Right Nov. 9, 2010    Lower Extrim.  . Hand surgery    . Rotator cuff repair    . Tonsillectomy     Allergies:  Review of patient's allergies indicates no known allergies. Prior to Admit Meds:   Prior to Admission medications   Medication Sig Start Date End Date Taking? Authorizing Provider  apixaban (ELIQUIS) 5 MG TABS tablet Take 2.5 mg by mouth every other day.  06/27/14  Yes Historical Provider, MD  omeprazole (PRILOSEC) 20 MG capsule Take 20 mg by mouth daily.   Yes Historical Provider, MD  VENTOLIN HFA 108 (90 BASE) MCG/ACT inhaler as needed. 03/07/14  Yes Historical Provider, MD   Fam HX:    Family History  Problem Relation Age of Onset  . Deep vein thrombosis Father   . Heart disease Father 60    Before age  60  . Hyperlipidemia Father   . Heart attack Father   . Stroke Father    Social HX:    Social History   Social History  . Marital Status: Married    Spouse Name: N/A  . Number of Children: N/A  . Years of Education: N/A   Occupational History  . Not on file.   Social History Main Topics  . Smoking status: Light Tobacco Smoker  . Smokeless tobacco: Never Used  . Alcohol  Use: 0.6 oz/week    1 Glasses of wine per week  . Drug Use: No  . Sexual Activity: Not on file   Other Topics Concern  . Not on file   Social History Narrative     ROS: Recent hematuria, no syncope, recent chest pain resolved. All 11 ROS were addressed and are negative except what is stated in the HPI   Physical Exam: Blood pressure 110/72, pulse 64, temperature 97.7 F (36.5 C), temperature source Oral, resp. rate 6, height 5\' 9"  (1.753 m), weight 170 lb 11.2 oz (77.429 kg), SpO2 99 %.   General: Well developed, well nourished, in no acute distress Head: Eyes PERRLA, No xanthomas.   Normal cephalic and atramatic  Lungs:   Clear bilaterally to auscultation and percussion. Normal respiratory effort. No wheezes, no rales. Heart:   HRRR S1 S2 Pulses are 2+ & equal. No murmur, rubs, gallops.  No carotid bruit. No JVD.  No abdominal bruits.  Abdomen: Bowel sounds are positive, abdomen soft and non-tender without masses. No hepatosplenomegaly. Msk:  Back normal. Normal strength and tone for age. Extremities:  Improved tightness bilateral lower extremity, mild swelling left hand from blood draw, catheter in place getting tPA infusion Neuro: Alert and oriented X 3, non-focal, MAE x 4 GU: Deferred Rectal: Deferred Psych:  Good affect, responds appropriately      Labs: Lab Results  Component Value Date   WBC 8.2 01/08/2015   HGB 15.2 01/08/2015   HCT 45.4 01/08/2015   MCV 108.4* 01/08/2015   PLT 48* 01/08/2015     Recent Labs Lab 01/07/15 1319  01/08/15 0321  NA 137  < > 137  K 5.4*  < > 4.5  CL 100*  < > 106  CO2 24  < > 22  BUN 10  < > 16  CREATININE 1.60*  < > 1.74*  CALCIUM 8.6*  < > 7.8*  PROT 7.2  --   --   BILITOT 2.2*  --   --   ALKPHOS 91  --   --   ALT 26  --   --   AST 25  --   --   GLUCOSE 174*  < > 119*  < > = values in this interval not displayed.  Recent Labs  01/07/15 1918 01/08/15 0017 01/08/15 0542  TROPONINI 0.07* 0.52* 0.64*    Radiology:   Ir Veno/ext/bi  01/08/2015  CLINICAL DATA:  Bilateral lower extremity swelling and pain. 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 IVC filter in place. Doppler describes bilateral extensive lower extremity DVT. EXAM: IR INFUSION THROMBOL VENOUS INITIAL (MS); BILATERAL EXTREMITY VENOGRAPHY; THROMBOECTOMY MECHANICAL VENOUS; IR ULTRASOUND GUIDANCE VASC ACCESS LEFT; IR ULTRASOUND GUIDANCE VASC ACCESS RIGHT; INFERIOR VENA CAVOGRAM ANESTHESIA/SEDATION: Intravenous Fentanyl and Versed were administered as conscious sedation during continuous cardiorespiratory monitoring by the radiology RN, with a total moderate sedation time of 45 minutes. MEDICATIONS: Lidocaine 1% subcutaneous CONTRAST:  40mL OMNIPAQUE IOHEXOL 300 MG/ML  SOLN PROCEDURE: The procedure, risks (including but  not limited to bleeding, infection, organ damage ), benefits, and alternatives were explained to the patient. Questions regarding the procedure were encouraged and answered. The patient understands and consents to the procedure. Patient placed prone on the procedure table. Bilateral lower extremities prepped and draped in usual sterile fashion. Maximal barrier sterile technique was utilized including caps, mask, sterile gowns, sterile gloves, sterile drape, hand hygiene and skin antiseptic. After time-out, moderate sedation was initiated. After the skin was infiltrated with lidocaine, the left posterior tibial vein was accessed under ultrasound guidance with a 21 gauge micropuncture set at the proximal calf level. Venography was performed. The micropuncture dilator was exchanged for a 8 [place] vascular sheath, through which a 5 French angled angiographic catheter was advanced for left lower extremity venography and inferior vena cavography. In similar fashion, on the right,the posterior tibial vein was accessed under ultrasound guidance with a 21 gauge micropuncture set at the proximal calf level. Venography was performed. The micropuncture dilator was  exchanged for a 8 [place] vascular sheath, through which a 5 French angled angiographic catheter was advanced for right lower extremity venography and advanced into the IVC. The angiographic catheters were exchanged for Rosen wires, both advanced up into the SVC. The AngioJetT ZelanteDVTT Thrombectomy Catheter was advanced through the right sheath into the IVC, and activated for thrombectomy of the caval and iliac DVT. In similar fashion, the catheter was advanced through the left sheath into the IVC over the guidewire. Patient complained of chest pain, and cardiac arrhythmias were identified. Blood pressure and O2 sats remained stable. The catheter was activated for thrombectomy of the caval and iliac DVT during withdrawal of the catheter. Thrombectomy was terminated. No further venography was performed. Bilateral infusion catheters were placed, 135 cm on the left and 95 cm on the right, both with 50 cm infusion lengths, placed to treat both iliac venous systems in the IVC through the thrombosed segments. Catheters and sheaths were secured externally with 0 silk suture and Steri-Strips, and covered with a sterile dressing. Patient was transferred to the ICU and bilateral tPA infusion initiated per protocol. COMPLICATIONS: Chest pain and cardiac arrhythmia after partial mechanical thrombectomy. SIR level B: Nominal therapy (including overnight admission for observation), no consequence. FINDINGS: Limited bilateral lower extremity venography demonstrated incompletely occlusive DVT. Central venography demonstrated occlusive DVT in bilateral iliac veins. There is occlusive thrombus through the IVC from the iliac confluence to the level of renal vein inflow was, extending through the IVC filter. Partial mechanical thrombectomy was accomplished, abandon because of patient symptomatology. Plan is for overnight catheter directed thrombolysis with follow-up venography 01/08/2015. IMPRESSION: 1. Extensive occlusive acute  thrombus through bilateral iliac venous systems and IVC, extending above the filter to the level of renal vein inflows. 2. Incompletely occlusive lower extremity DVT bilaterally. 3. Technically successful partial mechanical thrombolysis of iliocaval DVT as above. 4. Initiation of catheter directed tPA infusion through bilateral lower extremity infusion catheters. Patient will return 12/22 for follow-up venography and intervention as indicated. Electronically Signed   By: Corlis Leak M.D.   On: 01/08/2015 08:54   Ir Caffie Damme Ivc  01/08/2015  CLINICAL DATA:  Bilateral lower extremity swelling and pain. 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 IVC filter in place. Doppler describes bilateral extensive lower extremity DVT. EXAM: IR INFUSION THROMBOL VENOUS INITIAL (MS); BILATERAL EXTREMITY VENOGRAPHY; THROMBOECTOMY MECHANICAL VENOUS; IR ULTRASOUND GUIDANCE VASC ACCESS LEFT; IR ULTRASOUND GUIDANCE VASC ACCESS RIGHT; INFERIOR VENA CAVOGRAM ANESTHESIA/SEDATION: Intravenous Fentanyl and Versed were administered as conscious sedation during continuous  cardiorespiratory monitoring by the radiology RN, with a total moderate sedation time of 45 minutes. MEDICATIONS: Lidocaine 1% subcutaneous CONTRAST:  40mL OMNIPAQUE IOHEXOL 300 MG/ML  SOLN PROCEDURE: The procedure, risks (including but not limited to bleeding, infection, organ damage ), benefits, and alternatives were explained to the patient. Questions regarding the procedure were encouraged and answered. The patient understands and consents to the procedure. Patient placed prone on the procedure table. Bilateral lower extremities prepped and draped in usual sterile fashion. Maximal barrier sterile technique was utilized including caps, mask, sterile gowns, sterile gloves, sterile drape, hand hygiene and skin antiseptic. After time-out, moderate sedation was initiated. After the skin was infiltrated with lidocaine, the left posterior tibial vein was accessed under  ultrasound guidance with a 21 gauge micropuncture set at the proximal calf level. Venography was performed. The micropuncture dilator was exchanged for a 8 [place] vascular sheath, through which a 5 French angled angiographic catheter was advanced for left lower extremity venography and inferior vena cavography. In similar fashion, on the right,the posterior tibial vein was accessed under ultrasound guidance with a 21 gauge micropuncture set at the proximal calf level. Venography was performed. The micropuncture dilator was exchanged for a 8 [place] vascular sheath, through which a 5 French angled angiographic catheter was advanced for right lower extremity venography and advanced into the IVC. The angiographic catheters were exchanged for Rosen wires, both advanced up into the SVC. The AngioJetT ZelanteDVTT Thrombectomy Catheter was advanced through the right sheath into the IVC, and activated for thrombectomy of the caval and iliac DVT. In similar fashion, the catheter was advanced through the left sheath into the IVC over the guidewire. Patient complained of chest pain, and cardiac arrhythmias were identified. Blood pressure and O2 sats remained stable. The catheter was activated for thrombectomy of the caval and iliac DVT during withdrawal of the catheter. Thrombectomy was terminated. No further venography was performed. Bilateral infusion catheters were placed, 135 cm on the left and 95 cm on the right, both with 50 cm infusion lengths, placed to treat both iliac venous systems in the IVC through the thrombosed segments. Catheters and sheaths were secured externally with 0 silk suture and Steri-Strips, and covered with a sterile dressing. Patient was transferred to the ICU and bilateral tPA infusion initiated per protocol. COMPLICATIONS: Chest pain and cardiac arrhythmia after partial mechanical thrombectomy. SIR level B: Nominal therapy (including overnight admission for observation), no consequence. FINDINGS:  Limited bilateral lower extremity venography demonstrated incompletely occlusive DVT. Central venography demonstrated occlusive DVT in bilateral iliac veins. There is occlusive thrombus through the IVC from the iliac confluence to the level of renal vein inflow was, extending through the IVC filter. Partial mechanical thrombectomy was accomplished, abandon because of patient symptomatology. Plan is for overnight catheter directed thrombolysis with follow-up venography 01/08/2015. IMPRESSION: 1. Extensive occlusive acute thrombus through bilateral iliac venous systems and IVC, extending above the filter to the level of renal vein inflows. 2. Incompletely occlusive lower extremity DVT bilaterally. 3. Technically successful partial mechanical thrombolysis of iliocaval DVT as above. 4. Initiation of catheter directed tPA infusion through bilateral lower extremity infusion catheters. Patient will return 12/22 for follow-up venography and intervention as indicated. Electronically Signed   By: Corlis Leak M.D.   On: 01/08/2015 08:54   Ir Thrombect Veno Mech Mod Sed  01/08/2015  CLINICAL DATA:  Bilateral lower extremity swelling and pain. 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 IVC filter in place. Doppler describes bilateral extensive lower extremity DVT. EXAM: IR INFUSION  THROMBOL VENOUS INITIAL (MS); BILATERAL EXTREMITY VENOGRAPHY; THROMBOECTOMY MECHANICAL VENOUS; IR ULTRASOUND GUIDANCE VASC ACCESS LEFT; IR ULTRASOUND GUIDANCE VASC ACCESS RIGHT; INFERIOR VENA CAVOGRAM ANESTHESIA/SEDATION: Intravenous Fentanyl and Versed were administered as conscious sedation during continuous cardiorespiratory monitoring by the radiology RN, with a total moderate sedation time of 45 minutes. MEDICATIONS: Lidocaine 1% subcutaneous CONTRAST:  40mL OMNIPAQUE IOHEXOL 300 MG/ML  SOLN PROCEDURE: The procedure, risks (including but not limited to bleeding, infection, organ damage ), benefits, and alternatives were explained to the patient.  Questions regarding the procedure were encouraged and answered. The patient understands and consents to the procedure. Patient placed prone on the procedure table. Bilateral lower extremities prepped and draped in usual sterile fashion. Maximal barrier sterile technique was utilized including caps, mask, sterile gowns, sterile gloves, sterile drape, hand hygiene and skin antiseptic. After time-out, moderate sedation was initiated. After the skin was infiltrated with lidocaine, the left posterior tibial vein was accessed under ultrasound guidance with a 21 gauge micropuncture set at the proximal calf level. Venography was performed. The micropuncture dilator was exchanged for a 8 [place] vascular sheath, through which a 5 French angled angiographic catheter was advanced for left lower extremity venography and inferior vena cavography. In similar fashion, on the right,the posterior tibial vein was accessed under ultrasound guidance with a 21 gauge micropuncture set at the proximal calf level. Venography was performed. The micropuncture dilator was exchanged for a 8 [place] vascular sheath, through which a 5 French angled angiographic catheter was advanced for right lower extremity venography and advanced into the IVC. The angiographic catheters were exchanged for Rosen wires, both advanced up into the SVC. The AngioJetT ZelanteDVTT Thrombectomy Catheter was advanced through the right sheath into the IVC, and activated for thrombectomy of the caval and iliac DVT. In similar fashion, the catheter was advanced through the left sheath into the IVC over the guidewire. Patient complained of chest pain, and cardiac arrhythmias were identified. Blood pressure and O2 sats remained stable. The catheter was activated for thrombectomy of the caval and iliac DVT during withdrawal of the catheter. Thrombectomy was terminated. No further venography was performed. Bilateral infusion catheters were placed, 135 cm on the left and 95 cm  on the right, both with 50 cm infusion lengths, placed to treat both iliac venous systems in the IVC through the thrombosed segments. Catheters and sheaths were secured externally with 0 silk suture and Steri-Strips, and covered with a sterile dressing. Patient was transferred to the ICU and bilateral tPA infusion initiated per protocol. COMPLICATIONS: Chest pain and cardiac arrhythmia after partial mechanical thrombectomy. SIR level B: Nominal therapy (including overnight admission for observation), no consequence. FINDINGS: Limited bilateral lower extremity venography demonstrated incompletely occlusive DVT. Central venography demonstrated occlusive DVT in bilateral iliac veins. There is occlusive thrombus through the IVC from the iliac confluence to the level of renal vein inflow was, extending through the IVC filter. Partial mechanical thrombectomy was accomplished, abandon because of patient symptomatology. Plan is for overnight catheter directed thrombolysis with follow-up venography 01/08/2015. IMPRESSION: 1. Extensive occlusive acute thrombus through bilateral iliac venous systems and IVC, extending above the filter to the level of renal vein inflows. 2. Incompletely occlusive lower extremity DVT bilaterally. 3. Technically successful partial mechanical thrombolysis of iliocaval DVT as above. 4. Initiation of catheter directed tPA infusion through bilateral lower extremity infusion catheters. Patient will return 12/22 for follow-up venography and intervention as indicated. Electronically Signed   By: Corlis Leak M.D.   On: 01/08/2015 08:54  Ir [place] Guide Vasc Access Left  01/08/2015  CLINICAL DATA:  Bilateral lower extremity swelling and pain. 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 IVC filter in place. Doppler describes bilateral extensive lower extremity DVT. EXAM: IR INFUSION THROMBOL VENOUS INITIAL (MS); BILATERAL EXTREMITY VENOGRAPHY; THROMBOECTOMY MECHANICAL VENOUS; IR ULTRASOUND GUIDANCE VASC ACCESS  LEFT; IR ULTRASOUND GUIDANCE VASC ACCESS RIGHT; INFERIOR VENA CAVOGRAM ANESTHESIA/SEDATION: Intravenous Fentanyl and Versed were administered as conscious sedation during continuous cardiorespiratory monitoring by the radiology RN, with a total moderate sedation time of 45 minutes. MEDICATIONS: Lidocaine 1% subcutaneous CONTRAST:  40mL OMNIPAQUE IOHEXOL 300 MG/ML  SOLN PROCEDURE: The procedure, risks (including but not limited to bleeding, infection, organ damage ), benefits, and alternatives were explained to the patient. Questions regarding the procedure were encouraged and answered. The patient understands and consents to the procedure. Patient placed prone on the procedure table. Bilateral lower extremities prepped and draped in usual sterile fashion. Maximal barrier sterile technique was utilized including caps, mask, sterile gowns, sterile gloves, sterile drape, hand hygiene and skin antiseptic. After time-out, moderate sedation was initiated. After the skin was infiltrated with lidocaine, the left posterior tibial vein was accessed under ultrasound guidance with a 21 gauge micropuncture set at the proximal calf level. Venography was performed. The micropuncture dilator was exchanged for a 8 [place] vascular sheath, through which a 5 French angled angiographic catheter was advanced for left lower extremity venography and inferior vena cavography. In similar fashion, on the right,the posterior tibial vein was accessed under ultrasound guidance with a 21 gauge micropuncture set at the proximal calf level. Venography was performed. The micropuncture dilator was exchanged for a 8 [place] vascular sheath, through which a 5 French angled angiographic catheter was advanced for right lower extremity venography and advanced into the IVC. The angiographic catheters were exchanged for Rosen wires, both advanced up into the SVC. The AngioJetT ZelanteDVTT Thrombectomy Catheter was advanced through the right sheath into the  IVC, and activated for thrombectomy of the caval and iliac DVT. In similar fashion, the catheter was advanced through the left sheath into the IVC over the guidewire. Patient complained of chest pain, and cardiac arrhythmias were identified. Blood pressure and O2 sats remained stable. The catheter was activated for thrombectomy of the caval and iliac DVT during withdrawal of the catheter. Thrombectomy was terminated. No further venography was performed. Bilateral infusion catheters were placed, 135 cm on the left and 95 cm on the right, both with 50 cm infusion lengths, placed to treat both iliac venous systems in the IVC through the thrombosed segments. Catheters and sheaths were secured externally with 0 silk suture and Steri-Strips, and covered with a sterile dressing. Patient was transferred to the ICU and bilateral tPA infusion initiated per protocol. COMPLICATIONS: Chest pain and cardiac arrhythmia after partial mechanical thrombectomy. SIR level B: Nominal therapy (including overnight admission for observation), no consequence. FINDINGS: Limited bilateral lower extremity venography demonstrated incompletely occlusive DVT. Central venography demonstrated occlusive DVT in bilateral iliac veins. There is occlusive thrombus through the IVC from the iliac confluence to the level of renal vein inflow was, extending through the IVC filter. Partial mechanical thrombectomy was accomplished, abandon because of patient symptomatology. Plan is for overnight catheter directed thrombolysis with follow-up venography 01/08/2015. IMPRESSION: 1. Extensive occlusive acute thrombus through bilateral iliac venous systems and IVC, extending above the filter to the level of renal vein inflows. 2. Incompletely occlusive lower extremity DVT bilaterally. 3. Technically successful partial mechanical thrombolysis of iliocaval DVT as above. 4. Initiation of  catheter directed tPA infusion through bilateral lower extremity infusion  catheters. Patient will return 12/22 for follow-up venography and intervention as indicated. Electronically Signed   By: Corlis Leak M.D.   On: 01/08/2015 08:54   Ir US Guide Vasc Access Right  01/08/2015  CLINICAL DATA:  Bilateral lower extremity swelling and pain. 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 IVC filter in place. Doppler describes bilateral extensive lower extremity DVT. EXAM: IR INFUSION THROMBOL VENOUS INITIAL (MS); BILATERAL EXTREMITY VENOGRAPHY; THROMBOECTOMY MECHANICAL VENOUS; IR ULTRASOUND GUIDANCE VASC ACCESS LEFT; IR ULTRASOUND GUIDANCE VASC ACCESS RIGHT; INFERIOR VENA CAVOGRAM ANESTHESIA/SEDATION: Intravenous Fentanyl and Versed were administered as conscious sedation during continuous cardiorespiratory monitoring by the radiology RN, with a total moderate sedation time of 45 minutes. MEDICATIONS: Lidocaine 1% subcutaneous CONTRAST:  40mL OMNIPAQUE IOHEXOL 300 MG/ML  SOLN PROCEDURE: The procedure, risks (including but not limited to bleeding, infection, organ damage ), benefits, and alternatives were explained to the patient. Questions regarding the procedure were encouraged and answered. The patient understands and consents to the procedure. Patient placed prone on the procedure table. Bilateral lower extremities prepped and draped in usual sterile fashion. Maximal barrier sterile technique was utilized including caps, mask, sterile gowns, sterile gloves, sterile drape, hand hygiene and skin antiseptic. After time-out, moderate sedation was initiated. After the skin was infiltrated with lidocaine, the left posterior tibial vein was accessed under ultrasound guidance with a 21 gauge micropuncture set at the proximal calf level. Venography was performed. The micropuncture dilator was exchanged for a 8 [place] vascular sheath, through which a 5 French angled angiographic catheter was advanced for left lower extremity venography and inferior vena cavography. In similar fashion, on the right,the  posterior tibial vein was accessed under ultrasound guidance with a 21 gauge micropuncture set at the proximal calf level. Venography was performed. The micropuncture dilator was exchanged for a 8 [place] vascular sheath, through which a 5 French angled angiographic catheter was advanced for right lower extremity venography and advanced into the IVC. The angiographic catheters were exchanged for Rosen wires, both advanced up into the SVC. The AngioJetT ZelanteDVTT Thrombectomy Catheter was advanced through the right sheath into the IVC, and activated for thrombectomy of the caval and iliac DVT. In similar fashion, the catheter was advanced through the left sheath into the IVC over the guidewire. Patient complained of chest pain, and cardiac arrhythmias were identified. Blood pressure and O2 sats remained stable. The catheter was activated for thrombectomy of the caval and iliac DVT during withdrawal of the catheter. Thrombectomy was terminated. No further venography was performed. Bilateral infusion catheters were placed, 135 cm on the left and 95 cm on the right, both with 50 cm infusion lengths, placed to treat both iliac venous systems in the IVC through the thrombosed segments. Catheters and sheaths were secured externally with 0 silk suture and Steri-Strips, and covered with a sterile dressing. Patient was transferred to the ICU and bilateral tPA infusion initiated per protocol. COMPLICATIONS: Chest pain and cardiac arrhythmia after partial mechanical thrombectomy. SIR level B: Nominal therapy (including overnight admission for observation), no consequence. FINDINGS: Limited bilateral lower extremity venography demonstrated incompletely occlusive DVT. Central venography demonstrated occlusive DVT in bilateral iliac veins. There is occlusive thrombus through the IVC from the iliac confluence to the level of renal vein inflow was, extending through the IVC filter. Partial mechanical thrombectomy was accomplished,  abandon because of patient symptomatology. Plan is for overnight catheter directed thrombolysis with follow-up venography 01/08/2015. IMPRESSION: 1. Extensive occlusive acute thrombus through  bilateral iliac venous systems and IVC, extending above the filter to the level of renal vein inflows. 2. Incompletely occlusive lower extremity DVT bilaterally. 3. Technically successful partial mechanical thrombolysis of iliocaval DVT as above. 4. Initiation of catheter directed tPA infusion through bilateral lower extremity infusion catheters. Patient will return 12/22 for follow-up venography and intervention as indicated. Electronically Signed   By: Corlis Leak M.D.   On: 01/08/2015 08:54   Ir Infusion Thrombol Venous Initial (ms)  01/08/2015  CLINICAL DATA:  Bilateral lower extremity swelling and pain. 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 IVC filter in place. Doppler describes bilateral extensive lower extremity DVT. EXAM: IR INFUSION THROMBOL VENOUS INITIAL (MS); BILATERAL EXTREMITY VENOGRAPHY; THROMBOECTOMY MECHANICAL VENOUS; IR ULTRASOUND GUIDANCE VASC ACCESS LEFT; IR ULTRASOUND GUIDANCE VASC ACCESS RIGHT; INFERIOR VENA CAVOGRAM ANESTHESIA/SEDATION: Intravenous Fentanyl and Versed were administered as conscious sedation during continuous cardiorespiratory monitoring by the radiology RN, with a total moderate sedation time of 45 minutes. MEDICATIONS: Lidocaine 1% subcutaneous CONTRAST:  40mL OMNIPAQUE IOHEXOL 300 MG/ML  SOLN PROCEDURE: The procedure, risks (including but not limited to bleeding, infection, organ damage ), benefits, and alternatives were explained to the patient. Questions regarding the procedure were encouraged and answered. The patient understands and consents to the procedure. Patient placed prone on the procedure table. Bilateral lower extremities prepped and draped in usual sterile fashion. Maximal barrier sterile technique was utilized including caps, mask, sterile gowns, sterile gloves,  sterile drape, hand hygiene and skin antiseptic. After time-out, moderate sedation was initiated. After the skin was infiltrated with lidocaine, the left posterior tibial vein was accessed under ultrasound guidance with a 21 gauge micropuncture set at the proximal calf level. Venography was performed. The micropuncture dilator was exchanged for a 8 [place] vascular sheath, through which a 5 French angled angiographic catheter was advanced for left lower extremity venography and inferior vena cavography. In similar fashion, on the right,the posterior tibial vein was accessed under ultrasound guidance with a 21 gauge micropuncture set at the proximal calf level. Venography was performed. The micropuncture dilator was exchanged for a 8 [place] vascular sheath, through which a 5 French angled angiographic catheter was advanced for right lower extremity venography and advanced into the IVC. The angiographic catheters were exchanged for Rosen wires, both advanced up into the SVC. The AngioJetT ZelanteDVTT Thrombectomy Catheter was advanced through the right sheath into the IVC, and activated for thrombectomy of the caval and iliac DVT. In similar fashion, the catheter was advanced through the left sheath into the IVC over the guidewire. Patient complained of chest pain, and cardiac arrhythmias were identified. Blood pressure and O2 sats remained stable. The catheter was activated for thrombectomy of the caval and iliac DVT during withdrawal of the catheter. Thrombectomy was terminated. No further venography was performed. Bilateral infusion catheters were placed, 135 cm on the left and 95 cm on the right, both with 50 cm infusion lengths, placed to treat both iliac venous systems in the IVC through the thrombosed segments. Catheters and sheaths were secured externally with 0 silk suture and Steri-Strips, and covered with a sterile dressing. Patient was transferred to the ICU and bilateral tPA infusion initiated per  protocol. COMPLICATIONS: Chest pain and cardiac arrhythmia after partial mechanical thrombectomy. SIR level B: Nominal therapy (including overnight admission for observation), no consequence. FINDINGS: Limited bilateral lower extremity venography demonstrated incompletely occlusive DVT. Central venography demonstrated occlusive DVT in bilateral iliac veins. There is occlusive thrombus through the IVC from the iliac confluence to the level of  renal vein inflow was, extending through the IVC filter. Partial mechanical thrombectomy was accomplished, abandon because of patient symptomatology. Plan is for overnight catheter directed thrombolysis with follow-up venography 01/08/2015. IMPRESSION: 1. Extensive occlusive acute thrombus through bilateral iliac venous systems and IVC, extending above the filter to the level of renal vein inflows. 2. Incompletely occlusive lower extremity DVT bilaterally. 3. Technically successful partial mechanical thrombolysis of iliocaval DVT as above. 4. Initiation of catheter directed tPA infusion through bilateral lower extremity infusion catheters. Patient will return 12/22 for follow-up venography and intervention as indicated. Electronically Signed   By: Corlis Leak M.D.   On: 01/08/2015 08:54     EKG:    - 01/07/15 at 1:10 PM-sinus tachycardia rate 126 bpm, LVH, no ST segment changes.  - 01/08/15 at 2:13 AM-sinus rhythm 60, nonspecific T-wave changes, no ST segment abnormalities.Personally viewed.   telemetry strips personally reviewed as discussed above in history of present illness  Scheduled Meds: . pantoprazole (PROTONIX) IV  40 mg Intravenous Q24H  . sodium chloride  3 mL Intravenous Q12H  . sodium chloride  3 mL Intravenous Q12H   Continuous Infusions: . sodium chloride 125 mL/hr at 01/08/15 0800  . heparin 1,400 Units/hr (01/08/15 0800)   PRN Meds:.sodium chloride, sodium chloride, acetaminophen **OR** acetaminophen, alum & mag hydroxide-simeth, bisacodyl,  guaiFENesin-dextromethorphan, hydrALAZINE, magnesium hydroxide, metoprolol, morphine injection, ondansetron, oxyCODONE, phenol, sodium chloride, sodium chloride    ASSESSMENT/PLAN:    49 year old male who experienced chest discomfort during Angiojet thrombectomy/ thrombolysis with brief episodes of wide complex tachycardia causing termination of procedure. Troponin mildly elevated 0.5-0.6.  Chest pain/elevated troponin  - Theoretically I wonder if during the procedure there may have been some thrombus transition into pulmonary arterial tree resulting in discomfort as well as arrhythmia. This certainly may not be the case however. Reassuringly however, he is currently chest pain-free with no dyspnea. Recent history of pulmonary embolism.  - His EKG following his chest pain episode shows no ST segment changes.   - We will order echocardiogram to demonstrate structure and function of heart, evaluate function of right ventricle.  - At this point, given the low level elevation of troponin (0.5-0.6), flat trend, I do not believe this was true acute coronary syndrome or coronary thrombus. Likely more of a demand ischemia presentation.   - Once he is healed from his current illness, it would be reasonable to proceed with nuclear stress test to evaluate for ischemia.  - Unless further symptoms warrant, I would not proceed with cardiac catheterization at this time. (Also has other mitigating factors such as renal function, thrombocytopenia, anticoagulation).  Wide-complex tachycardia  - Upon careful inspection of telemetry strips provided (much appreciated) there does appear to be P wave activity perhaps an underlying atrial tachycardia or atrial flutter surrounding these episodes of tachycardia. Although we cannot fully exclude the possibility of true ventricular tachycardia, he may of an experiencing atrial tachycardia/flutter with aberant conduction.  Recurrent DVT/prior PE  - Status post IVC filter in  the past  - Long-term problem with hematuria  - Would suggest Coumadin use given his problems with Eliquis  - He was quite frustrated by his persistent hematuria. Urologic workup took place according to records.  Thrombocytopenia  - Carefully monitor especially in the setting of tPA and anticoagulation  - He has seen a hematology specialist in the past.  Peripheral vascular disease  - Prior right iliac stent in 2007  Phlegmasia cerulea dolens  - Lower extremities are improved  -  Per primary team.  Will follow along.   Donato Schultz, MD  01/08/2015  10:59 AM

## 2015-01-08 NOTE — Sedation Documentation (Signed)
Patient is resting comfortably. 

## 2015-01-08 NOTE — Sedation Documentation (Signed)
Infusion catheters bilateral lower extremities intact. Gauze and clear dressings applied. TPA infusions ordered and waiting from pharmacy.

## 2015-01-09 ENCOUNTER — Other Ambulatory Visit: Payer: Self-pay | Admitting: Radiology

## 2015-01-09 ENCOUNTER — Encounter (HOSPITAL_COMMUNITY): Payer: Self-pay | Admitting: Cardiology

## 2015-01-09 DIAGNOSIS — I2489 Other forms of acute ischemic heart disease: Secondary | ICD-10-CM

## 2015-01-09 DIAGNOSIS — R000 Tachycardia, unspecified: Secondary | ICD-10-CM

## 2015-01-09 DIAGNOSIS — I80203 Phlebitis and thrombophlebitis of unspecified deep vessels of lower extremities, bilateral: Secondary | ICD-10-CM

## 2015-01-09 DIAGNOSIS — R0789 Other chest pain: Secondary | ICD-10-CM

## 2015-01-09 DIAGNOSIS — I82403 Acute embolism and thrombosis of unspecified deep veins of lower extremity, bilateral: Secondary | ICD-10-CM

## 2015-01-09 DIAGNOSIS — D696 Thrombocytopenia, unspecified: Secondary | ICD-10-CM | POA: Diagnosis present

## 2015-01-09 DIAGNOSIS — I82409 Acute embolism and thrombosis of unspecified deep veins of unspecified lower extremity: Secondary | ICD-10-CM

## 2015-01-09 DIAGNOSIS — I739 Peripheral vascular disease, unspecified: Secondary | ICD-10-CM

## 2015-01-09 DIAGNOSIS — I248 Other forms of acute ischemic heart disease: Secondary | ICD-10-CM | POA: Diagnosis present

## 2015-01-09 DIAGNOSIS — I472 Ventricular tachycardia: Secondary | ICD-10-CM

## 2015-01-09 HISTORY — DX: Tachycardia, unspecified: R00.0

## 2015-01-09 HISTORY — DX: Other forms of acute ischemic heart disease: I24.89

## 2015-01-09 HISTORY — DX: Peripheral vascular disease, unspecified: I73.9

## 2015-01-09 HISTORY — DX: Thrombocytopenia, unspecified: D69.6

## 2015-01-09 LAB — CBC
HEMATOCRIT: 41.3 % (ref 39.0–52.0)
Hemoglobin: 13.9 g/dL (ref 13.0–17.0)
MCH: 36.4 pg — AB (ref 26.0–34.0)
MCHC: 33.7 g/dL (ref 30.0–36.0)
MCV: 108.1 fL — AB (ref 78.0–100.0)
Platelets: 55 10*3/uL — ABNORMAL LOW (ref 150–400)
RBC: 3.82 MIL/uL — AB (ref 4.22–5.81)
RDW: 13.8 % (ref 11.5–15.5)
WBC: 6.2 10*3/uL (ref 4.0–10.5)

## 2015-01-09 LAB — BASIC METABOLIC PANEL
Anion gap: 6 (ref 5–15)
BUN: 7 mg/dL (ref 6–20)
CALCIUM: 8.2 mg/dL — AB (ref 8.9–10.3)
CHLORIDE: 109 mmol/L (ref 101–111)
CO2: 27 mmol/L (ref 22–32)
CREATININE: 1.18 mg/dL (ref 0.61–1.24)
GFR calc non Af Amer: >60 mL/min (ref >60)
Glucose, Bld: 137 mg/dL — ABNORMAL HIGH (ref 65–99)
Potassium: 3.9 mmol/L (ref 3.5–5.1)
Sodium: 142 mmol/L (ref 135–145)

## 2015-01-09 LAB — HEPARIN LEVEL (UNFRACTIONATED): Heparin Unfractionated: 0.32 IU/mL (ref 0.30–0.70)

## 2015-01-09 LAB — URINE CULTURE: Culture: NO GROWTH

## 2015-01-09 MED ORDER — WARFARIN SODIUM 7.5 MG PO TABS
7.5000 mg | ORAL_TABLET | Freq: Once | ORAL | Status: AC
  Administered 2015-01-09: 7.5 mg via ORAL
  Filled 2015-01-09: qty 1

## 2015-01-09 MED ORDER — WARFARIN VIDEO: Freq: Once | Status: DC

## 2015-01-09 MED ORDER — COUMADIN BOOK
Freq: Once | Status: AC
  Administered 2015-01-09: 15:00:00
  Filled 2015-01-09: qty 1

## 2015-01-09 MED ORDER — WARFARIN - PHARMACIST DOSING INPATIENT
Freq: Every day | Status: DC
  Administered 2015-01-11: 18:00:00

## 2015-01-09 NOTE — Progress Notes (Addendum)
ANTICOAGULATION CONSULT NOTE - Follow Up Consult  Pharmacy Consult for heparin Indication: acute thrombosis w/ EKOS  Labs:  Recent Labs  01/07/15 1319 01/07/15 1330 01/07/15 1918 01/08/15 0017 01/08/15 0321 01/08/15 0542 01/08/15 1214 01/08/15 2121 01/09/15 0520  HGB 19.8* 21.1* 17.5* 15.9  --  15.2 14.0  --  13.9  HCT 56.3* 62.0* 49.8 47.6  --  45.4 42.1  --  41.3  PLT 72*  --  50* 46*  --  48* 44*  --  55*  APTT 165*  --   --   --   --   --   --   --   --   LABPROT 15.3*  --   --   --   --   --   --   --   --   INR 1.19  --   --   --   --   --   --   --   --   HEPARINUNFRC 1.06*  --   --  0.18*  --  0.28* 0.30  0.27* 0.31 0.32  CREATININE 1.60* 1.40* 1.79*  --  1.74*  --   --   --   --   TROPONINI  --   --  0.07* 0.52*  --  0.64*  --   --   --     Assessment: 49yo male continues on IV heparin s/p EKOS. Platelets are low at 55 but have been stable the last few lab draws. Heparin level remains therapeutic at 0.32. No new bleeding noted.    Goal of Therapy:  Heparin level 0.3-0.7 units/ml   Plan:  - Continue heparin gtt 1600 units/hr - Daily heparin level and CBC  Lysle Pearlachel Pharrell Ledford, PharmD, BCPS Pager # [phone] 01/09/2015 8:17 AM  Addendum: Starting warfarin  Plan: - Warfarin 7.5mg  PO x 1 tonight - Daily INR - Warfarin book + video to patient  Lysle Pearlachel Oretta Berkland, PharmD, BCPS Pager # [phone] 01/09/2015 1:52 PM

## 2015-01-09 NOTE — Progress Notes (Signed)
Pt transferred to 2W03 with belongings. Report given to receiving RN and all questions answered. VSS during transfer.  Pt assisted to bed in new room. Family updated on patient's location.

## 2015-01-09 NOTE — Progress Notes (Addendum)
  [facility] Progress Note  Subjective  - Hospital Day 2  Legs swelling and pain improved. Denies chest pain.   Objective Filed Vitals:   01/09/15 0730 01/09/15 0800  BP: 132/76 147/111  Pulse: 56 62  Temp:    Resp: 10 11   Tmax 98.7 BP sys 100s-140s  Intake/Output Summary (Last 24 hours) at 01/09/15 0928 Last data filed at 01/09/15 0800  Gross per 24 hour  Intake 3682.06 ml  Output   3470 ml  Net 212.06 ml   Bilateral leg swelling improved. Bilateral DP/PT doppler flow Popliteal sheath sites dressed. No hematoma seen.   Assessment/Planning: 49 y.o. male is with phlegmasia cerulea dolens b/l lower extremities s/p thrombolysis  -Lysis complete. Sheaths removed.  -Swelling improved with good doppler flow to both feet -Chest pain: no further chest pain. ECHO essentially normal except for mild diastolic dysfunction. Appreciate cardiology following.  -Recurrent DVT: Will need to decide on anticoagulation.  Cardiology recommending coumadin. Continue heparin.  -UTI: cultures pending. -Thrombocytopenia: plts 55 today, trending upwards.  -Erythrocytosis: improving. Continue hydration. -Elevated creatinine: resolved. 1.18 today.  -Mobilize today. -Transfer to [facility].    Kimberly A Trinh 01/09/2015 9:28 AM --  Laboratory CBC    Component Value Date/Time   WBC 6.2 01/09/2015 0520   HGB 13.9 01/09/2015 0520   HCT 41.3 01/09/2015 0520   PLT 55* 01/09/2015 0520    BMET    Component Value Date/Time   NA 137 01/08/2015 0321   K 4.5 01/08/2015 0321   CL 106 01/08/2015 0321   CO2 22 01/08/2015 0321   GLUCOSE 119* 01/08/2015 0321   BUN 16 01/08/2015 0321   CREATININE 1.74* 01/08/2015 0321   CALCIUM 7.8* 01/08/2015 0321   GFRNONAA 44* 01/08/2015 0321   GFRAA 51* 01/08/2015 0321    COAG Lab Results  Component Value Date   INR 1.19 01/07/2015   INR 0.96 02/19/2009   INR 1.46 11/28/2008   No results found for: PTT  Antibiotics Anti-infectives     None       Maris BergerKimberly Trinh, PA-C [facility] Office: [phone] Pager: [phone] 01/09/2015 9:28 AM   Legs much improved essentially at baseline Agree with Dr Anne FuSkains that may be reasonable to try coumadin in light of hematuria. Will start coumadin today with plan to d/c home when INR > 2  Fabienne Brunsharles Caytlyn Evers, MD [facility] [place] Office: [phone] Pager: [phone]

## 2015-01-09 NOTE — Discharge Instructions (Signed)

## 2015-01-09 NOTE — Progress Notes (Signed)
Subjective: Pt sitting up in chair, feels better S/p lysis of IVC and LE DVT Sheaths out after procedure completion yesterday Remains on IV heparin, to start po anticoagulation soon.  Allergies: Review of patient's allergies indicates no known allergies.  Medications:  Current facility-administered medications:  .  0.9 %  sodium chloride infusion, , Intravenous, Continuous, Samantha J Rhyne, PA-C, Last Rate: 125 mL/hr at 01/09/15 1100 .  0.9 %  sodium chloride infusion, 250 mL, Intravenous, PRN, Oley Balm, MD .  0.9 %  sodium chloride infusion, 250 mL, Intravenous, PRN, Oley Balm, MD .  acetaminophen (TYLENOL) tablet 325-650 mg, 325-650 mg, Oral, Q4H PRN **OR** acetaminophen (TYLENOL) suppository 325-650 mg, 325-650 mg, Rectal, Q4H PRN, Samantha J Rhyne, PA-C .  alum & mag hydroxide-simeth (MAALOX/MYLANTA) 200-200-20 MG/5ML suspension 15-30 mL, 15-30 mL, Oral, Q2H PRN, Samantha J Rhyne, PA-C .  bisacodyl (DULCOLAX) suppository 10 mg, 10 mg, Rectal, Daily PRN, Samantha J Rhyne, PA-C .  guaiFENesin-dextromethorphan (ROBITUSSIN DM) 100-10 MG/5ML syrup 15 mL, 15 mL, Oral, Q4H PRN, Samantha J Rhyne, PA-C .  heparin ADULT infusion 100 units/mL (25000 units/250 mL), 1,600 Units/hr, Intravenous, Continuous, Almon Hercules, RPH, Last Rate: 16 mL/hr at 01/09/15 1100, 1,600 Units/hr at 01/09/15 1100 .  hydrALAZINE (APRESOLINE) injection 5 mg, 5 mg, Intravenous, Q20 Min PRN, Samantha J Rhyne, PA-C .  magnesium hydroxide (MILK OF MAGNESIA) suspension 30 mL, 30 mL, Oral, Daily PRN, Samantha J Rhyne, PA-C .  metoprolol (LOPRESSOR) injection 2-5 mg, 2-5 mg, Intravenous, Q2H PRN, Samantha J Rhyne, PA-C .  morphine 2 MG/ML injection 2-4 mg, 2-4 mg, Intravenous, Q1H PRN, Ames Coupe Rhyne, PA-C, 4 mg at 01/09/15 0816 .  ondansetron (ZOFRAN) injection 4 mg, 4 mg, Intravenous, Q6H PRN, Samantha J Rhyne, PA-C .  oxyCODONE (Oxy IR/ROXICODONE) immediate release tablet 5-10 mg, 5-10 mg, Oral, Q4H  PRN, Samantha J Rhyne, PA-C, 10 mg at 01/09/15 1104 .  pantoprazole (PROTONIX) injection 40 mg, 40 mg, Intravenous, Q24H, Samantha J Rhyne, PA-C, 40 mg at 01/08/15 1610 .  phenol (CHLORASEPTIC) mouth spray 1 spray, 1 spray, Mouth/Throat, PRN, Samantha J Rhyne, PA-C .  sodium chloride 0.9 % injection 3 mL, 3 mL, Intravenous, Q12H, Oley Balm, MD, 3 mL at 01/08/15 2210 .  sodium chloride 0.9 % injection 3 mL, 3 mL, Intravenous, PRN, Oley Balm, MD .  sodium chloride 0.9 % injection 3 mL, 3 mL, Intravenous, Q12H, Oley Balm, MD, 3 mL at 01/08/15 2210 .  sodium chloride 0.9 % injection 3 mL, 3 mL, Intravenous, PRN, Oley Balm, MD, 3 mL at 01/08/15 1029    Vital Signs: BP 112/75 mmHg  Pulse 71  Temp(Src) 98.1 F (36.7 C) (Oral)  Resp 18  Ht 5\' 9"  (1.753 m)  Wt 172 lb 9.9 oz (78.3 kg)  BMI 25.48 kg/m2  SpO2 98%  Physical Exam  Constitutional: He appears well-developed. No distress.  Cardiovascular: Normal rate, regular rhythm and normal heart sounds.   Pulmonary/Chest: Effort normal and breath sounds normal. No respiratory distress.  Musculoskeletal:  (B)LE back to normal color Feet warm, good pulses, brisk cap refill Mild residual edema (B)popliteal dressings intact, soft, NT, no hematoma    Imaging: Ir Veno/ext/bi  01/08/2015  CLINICAL DATA:  Bilateral lower extremity swelling and pain. 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 IVC filter in place. Doppler describes bilateral extensive lower extremity DVT. EXAM: IR INFUSION THROMBOL VENOUS INITIAL (MS); BILATERAL EXTREMITY VENOGRAPHY; THROMBOECTOMY MECHANICAL VENOUS; IR ULTRASOUND GUIDANCE VASC ACCESS LEFT; IR ULTRASOUND GUIDANCE  VASC ACCESS RIGHT; INFERIOR VENA CAVOGRAM ANESTHESIA/SEDATION: Intravenous Fentanyl and Versed were administered as conscious sedation during continuous cardiorespiratory monitoring by the radiology RN, with a total moderate sedation time of 45 minutes. MEDICATIONS: Lidocaine 1% subcutaneous  CONTRAST:  40mL OMNIPAQUE IOHEXOL 300 MG/ML  SOLN PROCEDURE: The procedure, risks (including but not limited to bleeding, infection, organ damage ), benefits, and alternatives were explained to the patient. Questions regarding the procedure were encouraged and answered. The patient understands and consents to the procedure. Patient placed prone on the procedure table. Bilateral lower extremities prepped and draped in usual sterile fashion. Maximal barrier sterile technique was utilized including caps, mask, sterile gowns, sterile gloves, sterile drape, hand hygiene and skin antiseptic. After time-out, moderate sedation was initiated. After the skin was infiltrated with lidocaine, the left posterior tibial vein was accessed under ultrasound guidance with a 21 gauge micropuncture set at the proximal calf level. Venography was performed. The micropuncture dilator was exchanged for a 8 [place] vascular sheath, through which a 5 French angled angiographic catheter was advanced for left lower extremity venography and inferior vena cavography. In similar fashion, on the right,the posterior tibial vein was accessed under ultrasound guidance with a 21 gauge micropuncture set at the proximal calf level. Venography was performed. The micropuncture dilator was exchanged for a 8 [place] vascular sheath, through which a 5 French angled angiographic catheter was advanced for right lower extremity venography and advanced into the IVC. The angiographic catheters were exchanged for Rosen wires, both advanced up into the SVC. The AngioJetT ZelanteDVTT Thrombectomy Catheter was advanced through the right sheath into the IVC, and activated for thrombectomy of the caval and iliac DVT. In similar fashion, the catheter was advanced through the left sheath into the IVC over the guidewire. Patient complained of chest pain, and cardiac arrhythmias were identified. Blood pressure and O2 sats remained stable. The catheter was activated for  thrombectomy of the caval and iliac DVT during withdrawal of the catheter. Thrombectomy was terminated. No further venography was performed. Bilateral infusion catheters were placed, 135 cm on the left and 95 cm on the right, both with 50 cm infusion lengths, placed to treat both iliac venous systems in the IVC through the thrombosed segments. Catheters and sheaths were secured externally with 0 silk suture and Steri-Strips, and covered with a sterile dressing. Patient was transferred to the ICU and bilateral tPA infusion initiated per protocol. COMPLICATIONS: Chest pain and cardiac arrhythmia after partial mechanical thrombectomy. SIR level B: Nominal therapy (including overnight admission for observation), no consequence. FINDINGS: Limited bilateral lower extremity venography demonstrated incompletely occlusive DVT. Central venography demonstrated occlusive DVT in bilateral iliac veins. There is occlusive thrombus through the IVC from the iliac confluence to the level of renal vein inflow was, extending through the IVC filter. Partial mechanical thrombectomy was accomplished, abandon because of patient symptomatology. Plan is for overnight catheter directed thrombolysis with follow-up venography 01/08/2015. IMPRESSION: 1. Extensive occlusive acute thrombus through bilateral iliac venous systems and IVC, extending above the filter to the level of renal vein inflows. 2. Incompletely occlusive lower extremity DVT bilaterally. 3. Technically successful partial mechanical thrombolysis of iliocaval DVT as above. 4. Initiation of catheter directed tPA infusion through bilateral lower extremity infusion catheters. Patient will return 12/22 for follow-up venography and intervention as indicated. Electronically Signed   By: Corlis Leak  Hassell M.D.   On: 01/08/2015 08:54   Ir Caffie DammeVenocavagram Ivc  01/08/2015  CLINICAL DATA:  Bilateral lower extremity swelling and pain. Extensive iliocaval thrombosis on  CT. IVC filter in place.  Doppler describes bilateral extensive lower extremity DVT. EXAM: IR INFUSION THROMBOL VENOUS INITIAL (MS); BILATERAL EXTREMITY VENOGRAPHY; THROMBOECTOMY MECHANICAL VENOUS; IR ULTRASOUND GUIDANCE VASC ACCESS LEFT; IR ULTRASOUND GUIDANCE VASC ACCESS RIGHT; INFERIOR VENA CAVOGRAM ANESTHESIA/SEDATION: Intravenous Fentanyl and Versed were administered as conscious sedation during continuous cardiorespiratory monitoring by the radiology RN, with a total moderate sedation time of 45 minutes. MEDICATIONS: Lidocaine 1% subcutaneous CONTRAST:  40mL OMNIPAQUE IOHEXOL 300 MG/ML  SOLN PROCEDURE: The procedure, risks (including but not limited to bleeding, infection, organ damage ), benefits, and alternatives were explained to the patient. Questions regarding the procedure were encouraged and answered. The patient understands and consents to the procedure. Patient placed prone on the procedure table. Bilateral lower extremities prepped and draped in usual sterile fashion. Maximal barrier sterile technique was utilized including caps, mask, sterile gowns, sterile gloves, sterile drape, hand hygiene and skin antiseptic. After time-out, moderate sedation was initiated. After the skin was infiltrated with lidocaine, the left posterior tibial vein was accessed under ultrasound guidance with a 21 gauge micropuncture set at the proximal calf level. Venography was performed. The micropuncture dilator was exchanged for a 8 [place] vascular sheath, through which a 5 French angled angiographic catheter was advanced for left lower extremity venography and inferior vena cavography. In similar fashion, on the right,the posterior tibial vein was accessed under ultrasound guidance with a 21 gauge micropuncture set at the proximal calf level. Venography was performed. The micropuncture dilator was exchanged for a 8 [place] vascular sheath, through which a 5 French angled angiographic catheter was advanced for right lower extremity venography and  advanced into the IVC. The angiographic catheters were exchanged for Rosen wires, both advanced up into the SVC. The AngioJetT ZelanteDVTT Thrombectomy Catheter was advanced through the right sheath into the IVC, and activated for thrombectomy of the caval and iliac DVT. In similar fashion, the catheter was advanced through the left sheath into the IVC over the guidewire. Patient complained of chest pain, and cardiac arrhythmias were identified. Blood pressure and O2 sats remained stable. The catheter was activated for thrombectomy of the caval and iliac DVT during withdrawal of the catheter. Thrombectomy was terminated. No further venography was performed. Bilateral infusion catheters were placed, 135 cm on the left and 95 cm on the right, both with 50 cm infusion lengths, placed to treat both iliac venous systems in the IVC through the thrombosed segments. Catheters and sheaths were secured externally with 0 silk suture and Steri-Strips, and covered with a sterile dressing. Patient was transferred to the ICU and bilateral tPA infusion initiated per protocol. COMPLICATIONS: Chest pain and cardiac arrhythmia after partial mechanical thrombectomy. SIR level B: Nominal therapy (including overnight admission for observation), no consequence. FINDINGS: Limited bilateral lower extremity venography demonstrated incompletely occlusive DVT. Central venography demonstrated occlusive DVT in bilateral iliac veins. There is occlusive thrombus through the IVC from the iliac confluence to the level of renal vein inflow was, extending through the IVC filter. Partial mechanical thrombectomy was accomplished, abandon because of patient symptomatology. Plan is for overnight catheter directed thrombolysis with follow-up venography 01/08/2015. IMPRESSION: 1. Extensive occlusive acute thrombus through bilateral iliac venous systems and IVC, extending above the filter to the level of renal vein inflows. 2. Incompletely occlusive lower  extremity DVT bilaterally. 3. Technically successful partial mechanical thrombolysis of iliocaval DVT as above. 4. Initiation of catheter directed tPA infusion through bilateral lower extremity infusion catheters. Patient will return 12/22 for follow-up venography and intervention as indicated.  Electronically Signed   By: Corlis Leak M.D.   On: 01/08/2015 08:54   Ir Thrombect Veno Mech Mod Sed  01/08/2015  CLINICAL DATA:  Bilateral lower extremity swelling and pain. 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 IVC filter in place. Doppler describes bilateral extensive lower extremity DVT. EXAM: IR INFUSION THROMBOL VENOUS INITIAL (MS); BILATERAL EXTREMITY VENOGRAPHY; THROMBOECTOMY MECHANICAL VENOUS; IR ULTRASOUND GUIDANCE VASC ACCESS LEFT; IR ULTRASOUND GUIDANCE VASC ACCESS RIGHT; INFERIOR VENA CAVOGRAM ANESTHESIA/SEDATION: Intravenous Fentanyl and Versed were administered as conscious sedation during continuous cardiorespiratory monitoring by the radiology RN, with a total moderate sedation time of 45 minutes. MEDICATIONS: Lidocaine 1% subcutaneous CONTRAST:  40mL OMNIPAQUE IOHEXOL 300 MG/ML  SOLN PROCEDURE: The procedure, risks (including but not limited to bleeding, infection, organ damage ), benefits, and alternatives were explained to the patient. Questions regarding the procedure were encouraged and answered. The patient understands and consents to the procedure. Patient placed prone on the procedure table. Bilateral lower extremities prepped and draped in usual sterile fashion. Maximal barrier sterile technique was utilized including caps, mask, sterile gowns, sterile gloves, sterile drape, hand hygiene and skin antiseptic. After time-out, moderate sedation was initiated. After the skin was infiltrated with lidocaine, the left posterior tibial vein was accessed under ultrasound guidance with a 21 gauge micropuncture set at the proximal calf level. Venography was performed. The micropuncture dilator was exchanged  for a 8 [place] vascular sheath, through which a 5 French angled angiographic catheter was advanced for left lower extremity venography and inferior vena cavography. In similar fashion, on the right,the posterior tibial vein was accessed under ultrasound guidance with a 21 gauge micropuncture set at the proximal calf level. Venography was performed. The micropuncture dilator was exchanged for a 8 [place] vascular sheath, through which a 5 French angled angiographic catheter was advanced for right lower extremity venography and advanced into the IVC. The angiographic catheters were exchanged for Rosen wires, both advanced up into the SVC. The AngioJetT ZelanteDVTT Thrombectomy Catheter was advanced through the right sheath into the IVC, and activated for thrombectomy of the caval and iliac DVT. In similar fashion, the catheter was advanced through the left sheath into the IVC over the guidewire. Patient complained of chest pain, and cardiac arrhythmias were identified. Blood pressure and O2 sats remained stable. The catheter was activated for thrombectomy of the caval and iliac DVT during withdrawal of the catheter. Thrombectomy was terminated. No further venography was performed. Bilateral infusion catheters were placed, 135 cm on the left and 95 cm on the right, both with 50 cm infusion lengths, placed to treat both iliac venous systems in the IVC through the thrombosed segments. Catheters and sheaths were secured externally with 0 silk suture and Steri-Strips, and covered with a sterile dressing. Patient was transferred to the ICU and bilateral tPA infusion initiated per protocol. COMPLICATIONS: Chest pain and cardiac arrhythmia after partial mechanical thrombectomy. SIR level B: Nominal therapy (including overnight admission for observation), no consequence. FINDINGS: Limited bilateral lower extremity venography demonstrated incompletely occlusive DVT. Central venography demonstrated occlusive DVT in bilateral  iliac veins. There is occlusive thrombus through the IVC from the iliac confluence to the level of renal vein inflow was, extending through the IVC filter. Partial mechanical thrombectomy was accomplished, abandon because of patient symptomatology. Plan is for overnight catheter directed thrombolysis with follow-up venography 01/08/2015. IMPRESSION: 1. Extensive occlusive acute thrombus through bilateral iliac venous systems and IVC, extending above the filter to the level of renal vein inflows. 2. Incompletely occlusive lower extremity  DVT bilaterally. 3. Technically successful partial mechanical thrombolysis of iliocaval DVT as above. 4. Initiation of catheter directed tPA infusion through bilateral lower extremity infusion catheters. Patient will return 12/22 for follow-up venography and intervention as indicated. Electronically Signed   By: Corlis Leak M.D.   On: 01/08/2015 08:54   Ir US Guide Vasc Access Left  01/08/2015  CLINICAL DATA:  Bilateral lower extremity swelling and pain. 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 IVC filter in place. Doppler describes bilateral extensive lower extremity DVT. EXAM: IR INFUSION THROMBOL VENOUS INITIAL (MS); BILATERAL EXTREMITY VENOGRAPHY; THROMBOECTOMY MECHANICAL VENOUS; IR ULTRASOUND GUIDANCE VASC ACCESS LEFT; IR ULTRASOUND GUIDANCE VASC ACCESS RIGHT; INFERIOR VENA CAVOGRAM ANESTHESIA/SEDATION: Intravenous Fentanyl and Versed were administered as conscious sedation during continuous cardiorespiratory monitoring by the radiology RN, with a total moderate sedation time of 45 minutes. MEDICATIONS: Lidocaine 1% subcutaneous CONTRAST:  40mL OMNIPAQUE IOHEXOL 300 MG/ML  SOLN PROCEDURE: The procedure, risks (including but not limited to bleeding, infection, organ damage ), benefits, and alternatives were explained to the patient. Questions regarding the procedure were encouraged and answered. The patient understands and consents to the procedure. Patient placed prone on the  procedure table. Bilateral lower extremities prepped and draped in usual sterile fashion. Maximal barrier sterile technique was utilized including caps, mask, sterile gowns, sterile gloves, sterile drape, hand hygiene and skin antiseptic. After time-out, moderate sedation was initiated. After the skin was infiltrated with lidocaine, the left posterior tibial vein was accessed under ultrasound guidance with a 21 gauge micropuncture set at the proximal calf level. Venography was performed. The micropuncture dilator was exchanged for a 8 [place] vascular sheath, through which a 5 French angled angiographic catheter was advanced for left lower extremity venography and inferior vena cavography. In similar fashion, on the right,the posterior tibial vein was accessed under ultrasound guidance with a 21 gauge micropuncture set at the proximal calf level. Venography was performed. The micropuncture dilator was exchanged for a 8 [place] vascular sheath, through which a 5 French angled angiographic catheter was advanced for right lower extremity venography and advanced into the IVC. The angiographic catheters were exchanged for Rosen wires, both advanced up into the SVC. The AngioJetT ZelanteDVTT Thrombectomy Catheter was advanced through the right sheath into the IVC, and activated for thrombectomy of the caval and iliac DVT. In similar fashion, the catheter was advanced through the left sheath into the IVC over the guidewire. Patient complained of chest pain, and cardiac arrhythmias were identified. Blood pressure and O2 sats remained stable. The catheter was activated for thrombectomy of the caval and iliac DVT during withdrawal of the catheter. Thrombectomy was terminated. No further venography was performed. Bilateral infusion catheters were placed, 135 cm on the left and 95 cm on the right, both with 50 cm infusion lengths, placed to treat both iliac venous systems in the IVC through the thrombosed segments. Catheters and  sheaths were secured externally with 0 silk suture and Steri-Strips, and covered with a sterile dressing. Patient was transferred to the ICU and bilateral tPA infusion initiated per protocol. COMPLICATIONS: Chest pain and cardiac arrhythmia after partial mechanical thrombectomy. SIR level B: Nominal therapy (including overnight admission for observation), no consequence. FINDINGS: Limited bilateral lower extremity venography demonstrated incompletely occlusive DVT. Central venography demonstrated occlusive DVT in bilateral iliac veins. There is occlusive thrombus through the IVC from the iliac confluence to the level of renal vein inflow was, extending through the IVC filter. Partial mechanical thrombectomy was accomplished, abandon because of patient symptomatology. Plan is for  overnight catheter directed thrombolysis with follow-up venography 01/08/2015. IMPRESSION: 1. Extensive occlusive acute thrombus through bilateral iliac venous systems and IVC, extending above the filter to the level of renal vein inflows. 2. Incompletely occlusive lower extremity DVT bilaterally. 3. Technically successful partial mechanical thrombolysis of iliocaval DVT as above. 4. Initiation of catheter directed tPA infusion through bilateral lower extremity infusion catheters. Patient will return 12/22 for follow-up venography and intervention as indicated. Electronically Signed   By: Corlis Leak M.D.   On: 01/08/2015 08:54   Ir US Guide Vasc Access Right  01/08/2015  CLINICAL DATA:  Bilateral lower extremity swelling and pain. 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 IVC filter in place. Doppler describes bilateral extensive lower extremity DVT. EXAM: IR INFUSION THROMBOL VENOUS INITIAL (MS); BILATERAL EXTREMITY VENOGRAPHY; THROMBOECTOMY MECHANICAL VENOUS; IR ULTRASOUND GUIDANCE VASC ACCESS LEFT; IR ULTRASOUND GUIDANCE VASC ACCESS RIGHT; INFERIOR VENA CAVOGRAM ANESTHESIA/SEDATION: Intravenous Fentanyl and Versed were administered as  conscious sedation during continuous cardiorespiratory monitoring by the radiology RN, with a total moderate sedation time of 45 minutes. MEDICATIONS: Lidocaine 1% subcutaneous CONTRAST:  40mL OMNIPAQUE IOHEXOL 300 MG/ML  SOLN PROCEDURE: The procedure, risks (including but not limited to bleeding, infection, organ damage ), benefits, and alternatives were explained to the patient. Questions regarding the procedure were encouraged and answered. The patient understands and consents to the procedure. Patient placed prone on the procedure table. Bilateral lower extremities prepped and draped in usual sterile fashion. Maximal barrier sterile technique was utilized including caps, mask, sterile gowns, sterile gloves, sterile drape, hand hygiene and skin antiseptic. After time-out, moderate sedation was initiated. After the skin was infiltrated with lidocaine, the left posterior tibial vein was accessed under ultrasound guidance with a 21 gauge micropuncture set at the proximal calf level. Venography was performed. The micropuncture dilator was exchanged for a 8 [place] vascular sheath, through which a 5 French angled angiographic catheter was advanced for left lower extremity venography and inferior vena cavography. In similar fashion, on the right,the posterior tibial vein was accessed under ultrasound guidance with a 21 gauge micropuncture set at the proximal calf level. Venography was performed. The micropuncture dilator was exchanged for a 8 [place] vascular sheath, through which a 5 French angled angiographic catheter was advanced for right lower extremity venography and advanced into the IVC. The angiographic catheters were exchanged for Rosen wires, both advanced up into the SVC. The AngioJetT ZelanteDVTT Thrombectomy Catheter was advanced through the right sheath into the IVC, and activated for thrombectomy of the caval and iliac DVT. In similar fashion, the catheter was advanced through the left sheath into the IVC  over the guidewire. Patient complained of chest pain, and cardiac arrhythmias were identified. Blood pressure and O2 sats remained stable. The catheter was activated for thrombectomy of the caval and iliac DVT during withdrawal of the catheter. Thrombectomy was terminated. No further venography was performed. Bilateral infusion catheters were placed, 135 cm on the left and 95 cm on the right, both with 50 cm infusion lengths, placed to treat both iliac venous systems in the IVC through the thrombosed segments. Catheters and sheaths were secured externally with 0 silk suture and Steri-Strips, and covered with a sterile dressing. Patient was transferred to the ICU and bilateral tPA infusion initiated per protocol. COMPLICATIONS: Chest pain and cardiac arrhythmia after partial mechanical thrombectomy. SIR level B: Nominal therapy (including overnight admission for observation), no consequence. FINDINGS: Limited bilateral lower extremity venography demonstrated incompletely occlusive DVT. Central venography demonstrated occlusive DVT in bilateral iliac veins.  There is occlusive thrombus through the IVC from the iliac confluence to the level of renal vein inflow was, extending through the IVC filter. Partial mechanical thrombectomy was accomplished, abandon because of patient symptomatology. Plan is for overnight catheter directed thrombolysis with follow-up venography 01/08/2015. IMPRESSION: 1. Extensive occlusive acute thrombus through bilateral iliac venous systems and IVC, extending above the filter to the level of renal vein inflows. 2. Incompletely occlusive lower extremity DVT bilaterally. 3. Technically successful partial mechanical thrombolysis of iliocaval DVT as above. 4. Initiation of catheter directed tPA infusion through bilateral lower extremity infusion catheters. Patient will return 12/22 for follow-up venography and intervention as indicated. Electronically Signed   By: Corlis Leak M.D.   On: 01/08/2015  08:54   Ir Infusion Thrombol Venous Initial (ms)  01/08/2015  CLINICAL DATA:  Bilateral lower extremity swelling and pain. 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 IVC filter in place. Doppler describes bilateral extensive lower extremity DVT. EXAM: IR INFUSION THROMBOL VENOUS INITIAL (MS); BILATERAL EXTREMITY VENOGRAPHY; THROMBOECTOMY MECHANICAL VENOUS; IR ULTRASOUND GUIDANCE VASC ACCESS LEFT; IR ULTRASOUND GUIDANCE VASC ACCESS RIGHT; INFERIOR VENA CAVOGRAM ANESTHESIA/SEDATION: Intravenous Fentanyl and Versed were administered as conscious sedation during continuous cardiorespiratory monitoring by the radiology RN, with a total moderate sedation time of 45 minutes. MEDICATIONS: Lidocaine 1% subcutaneous CONTRAST:  40mL OMNIPAQUE IOHEXOL 300 MG/ML  SOLN PROCEDURE: The procedure, risks (including but not limited to bleeding, infection, organ damage ), benefits, and alternatives were explained to the patient. Questions regarding the procedure were encouraged and answered. The patient understands and consents to the procedure. Patient placed prone on the procedure table. Bilateral lower extremities prepped and draped in usual sterile fashion. Maximal barrier sterile technique was utilized including caps, mask, sterile gowns, sterile gloves, sterile drape, hand hygiene and skin antiseptic. After time-out, moderate sedation was initiated. After the skin was infiltrated with lidocaine, the left posterior tibial vein was accessed under ultrasound guidance with a 21 gauge micropuncture set at the proximal calf level. Venography was performed. The micropuncture dilator was exchanged for a 8 [place] vascular sheath, through which a 5 French angled angiographic catheter was advanced for left lower extremity venography and inferior vena cavography. In similar fashion, on the right,the posterior tibial vein was accessed under ultrasound guidance with a 21 gauge micropuncture set at the proximal calf level. Venography was  performed. The micropuncture dilator was exchanged for a 8 [place] vascular sheath, through which a 5 French angled angiographic catheter was advanced for right lower extremity venography and advanced into the IVC. The angiographic catheters were exchanged for Rosen wires, both advanced up into the SVC. The AngioJetT ZelanteDVTT Thrombectomy Catheter was advanced through the right sheath into the IVC, and activated for thrombectomy of the caval and iliac DVT. In similar fashion, the catheter was advanced through the left sheath into the IVC over the guidewire. Patient complained of chest pain, and cardiac arrhythmias were identified. Blood pressure and O2 sats remained stable. The catheter was activated for thrombectomy of the caval and iliac DVT during withdrawal of the catheter. Thrombectomy was terminated. No further venography was performed. Bilateral infusion catheters were placed, 135 cm on the left and 95 cm on the right, both with 50 cm infusion lengths, placed to treat both iliac venous systems in the IVC through the thrombosed segments. Catheters and sheaths were secured externally with 0 silk suture and Steri-Strips, and covered with a sterile dressing. Patient was transferred to the ICU and bilateral tPA infusion initiated per protocol. COMPLICATIONS: Chest pain and  cardiac arrhythmia after partial mechanical thrombectomy. SIR level B: Nominal therapy (including overnight admission for observation), no consequence. FINDINGS: Limited bilateral lower extremity venography demonstrated incompletely occlusive DVT. Central venography demonstrated occlusive DVT in bilateral iliac veins. There is occlusive thrombus through the IVC from the iliac confluence to the level of renal vein inflow was, extending through the IVC filter. Partial mechanical thrombectomy was accomplished, abandon because of patient symptomatology. Plan is for overnight catheter directed thrombolysis with follow-up venography 01/08/2015.  IMPRESSION: 1. Extensive occlusive acute thrombus through bilateral iliac venous systems and IVC, extending above the filter to the level of renal vein inflows. 2. Incompletely occlusive lower extremity DVT bilaterally. 3. Technically successful partial mechanical thrombolysis of iliocaval DVT as above. 4. Initiation of catheter directed tPA infusion through bilateral lower extremity infusion catheters. Patient will return 12/22 for follow-up venography and intervention as indicated. Electronically Signed   By: Corlis Leak M.D.   On: 01/08/2015 08:54   Ir Rande Lawman F/u Eval Art/ven Basil Dess Day (ms)  01/09/2015  CLINICAL DATA:  49 year old male with a history of ilio-caval thrombus secondary to occluded IVC filter. The patient has received pharmacologic thrombolysis overnight via directed catheters. He returns for lysis check. EXAM: CONTINUATION OF THROMBOLYSIS WITH BILATERAL VENOGRAM LOWER EXTREMITIES AND CAVAGRAM FLUOROSCOPY TIME:  30 seconds MEDICATIONS AND MEDICAL HISTORY: None ANESTHESIA/SEDATION: 1.0 mg Versed, 50 mcg fentanyl CONTRAST:  OMNIPAQUE IOHEXOL 300 MG/ML  SOLN COMPLICATIONS: None PROCEDURE: Informed consent was obtained from the patient following explanation of the procedure, risks, benefits and alternatives. The patient understands, agrees and consents for the procedure. All questions were addressed. A time out was performed. The patient is position prone position on the IR table, and the bilateral popliteal region were prepped and draped in the usual sterile fashion including the indwelling catheters. Obturator wires were removed from the bilateral catheters, and pullback venogram was performed of the left and right lower extremity, including a cavagram. All sheaths wires and catheters were then removed. Sterile bandages were placed at the popliteal insertion site bilaterally. Patient tolerated the procedure well and remained hemodynamically stable throughout. No complications were encountered  and no significant blood loss encountered. FINDINGS: Initial image demonstrates bilateral thrombolysis catheters from the lower extremities unchanged in position above the level of the IVC filter. Venogram bilateral lower extremities and the cavagram demonstrates no residual thrombus within the bilateral iliac systems, with free flowing contrast through the cava. Small volume of lucent filling defects associated with the filter, potentially nonocclusive thrombus or chronic synechiae. Bilateral femoral popliteal venous system patent. IMPRESSION: Status post thrombolysis completion, with pullback venogram of the bilateral iliofemoral venous system and the vena cava with resolution of ileal caval thrombus and free-flowing venous system confirm. Minimal volume lucent filling defects associated with the IVC filter (appears to be a Trapease filter), potentially nonocclusive thrombus or chronic synechiae. Signed, Yvone Neu. Loreta Ave DO [facility] PLAN: Agree with continued anti coagulation. Bilateral compression dressing at the popliteal insertion sites overnight. Follow up visit with VIR in 2-4 weeks for discussion regarding possible treatment options regarding the filter. Electronically Signed   By: Gilmer Mor D.O.   On: 01/09/2015 08:19    Labs:  CBC:  Recent Labs  01/08/15 0017 01/08/15 0542 01/08/15 1214 01/09/15 0520  WBC 8.6 8.2 6.7 6.2  HGB 15.9 15.2 14.0 13.9  HCT 47.6 45.4 42.1 41.3  PLT 46* 48* 44* 55*    COAGS:  Recent Labs  01/07/15 1319  INR  1.19  APTT 165*    BMP:  Recent Labs  01/07/15 1319 01/07/15 1330 01/07/15 1918 01/08/15 0321 01/09/15 1015  NA 137 136 137 137 142  K 5.4* 5.3* 5.8* 4.5 3.9  CL 100* 101 103 106 109  CO2 24  --  23 22 27   GLUCOSE 174* 160* 123* 119* 137*  BUN 10 15 14 16 7   CALCIUM 8.6*  --  8.2* 7.8* 8.2*  CREATININE 1.60* 1.40* 1.79* 1.74* 1.18  GFRNONAA 49*  --  43* 44* >60  GFRAA  57*  --  50* 51* >60    LIVER FUNCTION TESTS:  Recent Labs  01/07/15 1319  BILITOT 2.2*  AST 25  ALT 26  ALKPHOS 91  PROT 7.2  ALBUMIN 4.5    Assessment and Plan: S/p IVC and (B)LE thrombolytic assisted thrombectomy with good result of flow On IV heparin, convert to po anticoagulation Compression stockings Will have pt follow up with IR clinic in 3-4 weeks  Signed: Brayton El 01/09/2015, 11:30 AM   I spent a total of 15 Minutes at the the patient's bedside AND on the patient's hospital floor or unit, greater than 50% of which was counseling/coordinating care for IVC and LE DVT thrombolysis

## 2015-01-09 NOTE — Progress Notes (Signed)
Pt admitted to 2W from 2S. Telemetry applied and Ccmd notified and verified with second nurse. Pt oriented to the room included call bell and call light. Pt denies chest pain and shortness of breath.

## 2015-01-09 NOTE — Progress Notes (Signed)
Cardiologist: Dr. Gwen Pounds Subjective:  No further CP/ SOB. In chair. EKOS off.   Objective:  Vital Signs in the last 24 hours: Temp:  [98 F (36.7 C)-98.7 F (37.1 C)] 98.1 F (36.7 C) (12/23 0700) Pulse Rate:  [54-90] 62 (12/23 0800) Resp:  [6-26] 11 (12/23 0800) BP: (105-147)/(64-111) 147/111 mmHg (12/23 0800) SpO2:  [93 %-100 %] 99 % (12/23 0800) Weight:  [172 lb 9.9 oz (78.3 kg)] 172 lb 9.9 oz (78.3 kg) (12/23 0500)  Intake/Output from previous day: 12/22 0701 - 12/23 0700 In: 3902.3 [I.V.:3782.3] Out: 3510 [Urine:3510]   Physical Exam: General: Well developed, well nourished, in no acute distress. Head:  Normocephalic and atraumatic. Lungs: Clear to auscultation and percussion. Heart: Normal S1 and S2.  No murmur, rubs or gallops.  Abdomen: soft, non-tender, positive bowel sounds. Extremities: No clubbing or cyanosis. minimal edema. Neurologic: Alert and oriented x 3.    Lab Results:  Recent Labs  01/08/15 1214 01/09/15 0520  WBC 6.7 6.2  HGB 14.0 13.9  PLT 44* 55*    Recent Labs  01/07/15 1918 01/08/15 0321  NA 137 137  K 5.8* 4.5  CL 103 106  CO2 23 22  GLUCOSE 123* 119*  BUN 14 16  CREATININE 1.79* 1.74*    Recent Labs  01/08/15 0017 01/08/15 0542  TROPONINI 0.52* 0.64*   Hepatic Function Panel  Recent Labs  01/07/15 1319  PROT 7.2  ALBUMIN 4.5  AST 25  ALT 26  ALKPHOS 91  BILITOT 2.2*   No results for input(s): CHOL in the last 72 hours. No results for input(s): PROTIME in the last 72 hours.  Imaging: Ir Veno/ext/bi  01/08/2015  CLINICAL DATA:  Bilateral lower extremity swelling and pain. 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 IVC filter in place. Doppler describes bilateral extensive lower extremity DVT. EXAM: IR INFUSION THROMBOL VENOUS INITIAL (MS); BILATERAL EXTREMITY VENOGRAPHY; THROMBOECTOMY MECHANICAL VENOUS; IR ULTRASOUND GUIDANCE VASC ACCESS LEFT; IR ULTRASOUND GUIDANCE VASC ACCESS RIGHT; INFERIOR VENA CAVOGRAM  ANESTHESIA/SEDATION: Intravenous Fentanyl and Versed were administered as conscious sedation during continuous cardiorespiratory monitoring by the radiology RN, with a total moderate sedation time of 45 minutes. MEDICATIONS: Lidocaine 1% subcutaneous CONTRAST:  40mL OMNIPAQUE IOHEXOL 300 MG/ML  SOLN PROCEDURE: The procedure, risks (including but not limited to bleeding, infection, organ damage ), benefits, and alternatives were explained to the patient. Questions regarding the procedure were encouraged and answered. The patient understands and consents to the procedure. Patient placed prone on the procedure table. Bilateral lower extremities prepped and draped in usual sterile fashion. Maximal barrier sterile technique was utilized including caps, mask, sterile gowns, sterile gloves, sterile drape, hand hygiene and skin antiseptic. After time-out, moderate sedation was initiated. After the skin was infiltrated with lidocaine, the left posterior tibial vein was accessed under ultrasound guidance with a 21 gauge micropuncture set at the proximal calf level. Venography was performed. The micropuncture dilator was exchanged for a 8 [place] vascular sheath, through which a 5 French angled angiographic catheter was advanced for left lower extremity venography and inferior vena cavography. In similar fashion, on the right,the posterior tibial vein was accessed under ultrasound guidance with a 21 gauge micropuncture set at the proximal calf level. Venography was performed. The micropuncture dilator was exchanged for a 8 [place] vascular sheath, through which a 5 French angled angiographic catheter was advanced for right lower extremity venography and advanced into the IVC. The angiographic catheters were exchanged for Rosen wires, both advanced up into the SVC. The  AngioJetT ZelanteDVTT Thrombectomy Catheter was advanced through the right sheath into the IVC, and activated for thrombectomy of the caval and iliac DVT. In  similar fashion, the catheter was advanced through the left sheath into the IVC over the guidewire. Patient complained of chest pain, and cardiac arrhythmias were identified. Blood pressure and O2 sats remained stable. The catheter was activated for thrombectomy of the caval and iliac DVT during withdrawal of the catheter. Thrombectomy was terminated. No further venography was performed. Bilateral infusion catheters were placed, 135 cm on the left and 95 cm on the right, both with 50 cm infusion lengths, placed to treat both iliac venous systems in the IVC through the thrombosed segments. Catheters and sheaths were secured externally with 0 silk suture and Steri-Strips, and covered with a sterile dressing. Patient was transferred to the ICU and bilateral tPA infusion initiated per protocol. COMPLICATIONS: Chest pain and cardiac arrhythmia after partial mechanical thrombectomy. SIR level B: Nominal therapy (including overnight admission for observation), no consequence. FINDINGS: Limited bilateral lower extremity venography demonstrated incompletely occlusive DVT. Central venography demonstrated occlusive DVT in bilateral iliac veins. There is occlusive thrombus through the IVC from the iliac confluence to the level of renal vein inflow was, extending through the IVC filter. Partial mechanical thrombectomy was accomplished, abandon because of patient symptomatology. Plan is for overnight catheter directed thrombolysis with follow-up venography 01/08/2015. IMPRESSION: 1. Extensive occlusive acute thrombus through bilateral iliac venous systems and IVC, extending above the filter to the level of renal vein inflows. 2. Incompletely occlusive lower extremity DVT bilaterally. 3. Technically successful partial mechanical thrombolysis of iliocaval DVT as above. 4. Initiation of catheter directed tPA infusion through bilateral lower extremity infusion catheters. Patient will return 12/22 for follow-up venography and  intervention as indicated. Electronically Signed   By: Corlis Leak M.D.   On: 01/08/2015 08:54   Ir Caffie Damme Ivc  01/08/2015  CLINICAL DATA:  Bilateral lower extremity swelling and pain. 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 IVC filter in place. Doppler describes bilateral extensive lower extremity DVT. EXAM: IR INFUSION THROMBOL VENOUS INITIAL (MS); BILATERAL EXTREMITY VENOGRAPHY; THROMBOECTOMY MECHANICAL VENOUS; IR ULTRASOUND GUIDANCE VASC ACCESS LEFT; IR ULTRASOUND GUIDANCE VASC ACCESS RIGHT; INFERIOR VENA CAVOGRAM ANESTHESIA/SEDATION: Intravenous Fentanyl and Versed were administered as conscious sedation during continuous cardiorespiratory monitoring by the radiology RN, with a total moderate sedation time of 45 minutes. MEDICATIONS: Lidocaine 1% subcutaneous CONTRAST:  40mL OMNIPAQUE IOHEXOL 300 MG/ML  SOLN PROCEDURE: The procedure, risks (including but not limited to bleeding, infection, organ damage ), benefits, and alternatives were explained to the patient. Questions regarding the procedure were encouraged and answered. The patient understands and consents to the procedure. Patient placed prone on the procedure table. Bilateral lower extremities prepped and draped in usual sterile fashion. Maximal barrier sterile technique was utilized including caps, mask, sterile gowns, sterile gloves, sterile drape, hand hygiene and skin antiseptic. After time-out, moderate sedation was initiated. After the skin was infiltrated with lidocaine, the left posterior tibial vein was accessed under ultrasound guidance with a 21 gauge micropuncture set at the proximal calf level. Venography was performed. The micropuncture dilator was exchanged for a 8 [place] vascular sheath, through which a 5 French angled angiographic catheter was advanced for left lower extremity venography and inferior vena cavography. In similar fashion, on the right,the posterior tibial vein was accessed under ultrasound guidance with a 21 gauge  micropuncture set at the proximal calf level. Venography was performed. The micropuncture dilator was exchanged for a 8 [place] vascular sheath,  through which a 5 French angled angiographic catheter was advanced for right lower extremity venography and advanced into the IVC. The angiographic catheters were exchanged for Rosen wires, both advanced up into the SVC. The AngioJetT ZelanteDVTT Thrombectomy Catheter was advanced through the right sheath into the IVC, and activated for thrombectomy of the caval and iliac DVT. In similar fashion, the catheter was advanced through the left sheath into the IVC over the guidewire. Patient complained of chest pain, and cardiac arrhythmias were identified. Blood pressure and O2 sats remained stable. The catheter was activated for thrombectomy of the caval and iliac DVT during withdrawal of the catheter. Thrombectomy was terminated. No further venography was performed. Bilateral infusion catheters were placed, 135 cm on the left and 95 cm on the right, both with 50 cm infusion lengths, placed to treat both iliac venous systems in the IVC through the thrombosed segments. Catheters and sheaths were secured externally with 0 silk suture and Steri-Strips, and covered with a sterile dressing. Patient was transferred to the ICU and bilateral tPA infusion initiated per protocol. COMPLICATIONS: Chest pain and cardiac arrhythmia after partial mechanical thrombectomy. SIR level B: Nominal therapy (including overnight admission for observation), no consequence. FINDINGS: Limited bilateral lower extremity venography demonstrated incompletely occlusive DVT. Central venography demonstrated occlusive DVT in bilateral iliac veins. There is occlusive thrombus through the IVC from the iliac confluence to the level of renal vein inflow was, extending through the IVC filter. Partial mechanical thrombectomy was accomplished, abandon because of patient symptomatology. Plan is for overnight catheter  directed thrombolysis with follow-up venography 01/08/2015. IMPRESSION: 1. Extensive occlusive acute thrombus through bilateral iliac venous systems and IVC, extending above the filter to the level of renal vein inflows. 2. Incompletely occlusive lower extremity DVT bilaterally. 3. Technically successful partial mechanical thrombolysis of iliocaval DVT as above. 4. Initiation of catheter directed tPA infusion through bilateral lower extremity infusion catheters. Patient will return 12/22 for follow-up venography and intervention as indicated. Electronically Signed   By: Corlis Leak M.D.   On: 01/08/2015 08:54   Ir Thrombect Veno Mech Mod Sed  01/08/2015  CLINICAL DATA:  Bilateral lower extremity swelling and pain. 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 IVC filter in place. Doppler describes bilateral extensive lower extremity DVT. EXAM: IR INFUSION THROMBOL VENOUS INITIAL (MS); BILATERAL EXTREMITY VENOGRAPHY; THROMBOECTOMY MECHANICAL VENOUS; IR ULTRASOUND GUIDANCE VASC ACCESS LEFT; IR ULTRASOUND GUIDANCE VASC ACCESS RIGHT; INFERIOR VENA CAVOGRAM ANESTHESIA/SEDATION: Intravenous Fentanyl and Versed were administered as conscious sedation during continuous cardiorespiratory monitoring by the radiology RN, with a total moderate sedation time of 45 minutes. MEDICATIONS: Lidocaine 1% subcutaneous CONTRAST:  40mL OMNIPAQUE IOHEXOL 300 MG/ML  SOLN PROCEDURE: The procedure, risks (including but not limited to bleeding, infection, organ damage ), benefits, and alternatives were explained to the patient. Questions regarding the procedure were encouraged and answered. The patient understands and consents to the procedure. Patient placed prone on the procedure table. Bilateral lower extremities prepped and draped in usual sterile fashion. Maximal barrier sterile technique was utilized including caps, mask, sterile gowns, sterile gloves, sterile drape, hand hygiene and skin antiseptic. After time-out, moderate sedation was  initiated. After the skin was infiltrated with lidocaine, the left posterior tibial vein was accessed under ultrasound guidance with a 21 gauge micropuncture set at the proximal calf level. Venography was performed. The micropuncture dilator was exchanged for a 8 [place] vascular sheath, through which a 5 French angled angiographic catheter was advanced for left lower extremity venography and inferior vena cavography. In similar  fashion, on the right,the posterior tibial vein was accessed under ultrasound guidance with a 21 gauge micropuncture set at the proximal calf level. Venography was performed. The micropuncture dilator was exchanged for a 8 [place] vascular sheath, through which a 5 French angled angiographic catheter was advanced for right lower extremity venography and advanced into the IVC. The angiographic catheters were exchanged for Rosen wires, both advanced up into the SVC. The AngioJetT ZelanteDVTT Thrombectomy Catheter was advanced through the right sheath into the IVC, and activated for thrombectomy of the caval and iliac DVT. In similar fashion, the catheter was advanced through the left sheath into the IVC over the guidewire. Patient complained of chest pain, and cardiac arrhythmias were identified. Blood pressure and O2 sats remained stable. The catheter was activated for thrombectomy of the caval and iliac DVT during withdrawal of the catheter. Thrombectomy was terminated. No further venography was performed. Bilateral infusion catheters were placed, 135 cm on the left and 95 cm on the right, both with 50 cm infusion lengths, placed to treat both iliac venous systems in the IVC through the thrombosed segments. Catheters and sheaths were secured externally with 0 silk suture and Steri-Strips, and covered with a sterile dressing. Patient was transferred to the ICU and bilateral tPA infusion initiated per protocol. COMPLICATIONS: Chest pain and cardiac arrhythmia after partial mechanical  thrombectomy. SIR level B: Nominal therapy (including overnight admission for observation), no consequence. FINDINGS: Limited bilateral lower extremity venography demonstrated incompletely occlusive DVT. Central venography demonstrated occlusive DVT in bilateral iliac veins. There is occlusive thrombus through the IVC from the iliac confluence to the level of renal vein inflow was, extending through the IVC filter. Partial mechanical thrombectomy was accomplished, abandon because of patient symptomatology. Plan is for overnight catheter directed thrombolysis with follow-up venography 01/08/2015. IMPRESSION: 1. Extensive occlusive acute thrombus through bilateral iliac venous systems and IVC, extending above the filter to the level of renal vein inflows. 2. Incompletely occlusive lower extremity DVT bilaterally. 3. Technically successful partial mechanical thrombolysis of iliocaval DVT as above. 4. Initiation of catheter directed tPA infusion through bilateral lower extremity infusion catheters. Patient will return 12/22 for follow-up venography and intervention as indicated. Electronically Signed   By: Corlis Leak M.D.   On: 01/08/2015 08:54   Ir US Guide Vasc Access Left  01/08/2015  CLINICAL DATA:  Bilateral lower extremity swelling and pain. 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 IVC filter in place. Doppler describes bilateral extensive lower extremity DVT. EXAM: IR INFUSION THROMBOL VENOUS INITIAL (MS); BILATERAL EXTREMITY VENOGRAPHY; THROMBOECTOMY MECHANICAL VENOUS; IR ULTRASOUND GUIDANCE VASC ACCESS LEFT; IR ULTRASOUND GUIDANCE VASC ACCESS RIGHT; INFERIOR VENA CAVOGRAM ANESTHESIA/SEDATION: Intravenous Fentanyl and Versed were administered as conscious sedation during continuous cardiorespiratory monitoring by the radiology RN, with a total moderate sedation time of 45 minutes. MEDICATIONS: Lidocaine 1% subcutaneous CONTRAST:  40mL OMNIPAQUE IOHEXOL 300 MG/ML  SOLN PROCEDURE: The procedure, risks (including  but not limited to bleeding, infection, organ damage ), benefits, and alternatives were explained to the patient. Questions regarding the procedure were encouraged and answered. The patient understands and consents to the procedure. Patient placed prone on the procedure table. Bilateral lower extremities prepped and draped in usual sterile fashion. Maximal barrier sterile technique was utilized including caps, mask, sterile gowns, sterile gloves, sterile drape, hand hygiene and skin antiseptic. After time-out, moderate sedation was initiated. After the skin was infiltrated with lidocaine, the left posterior tibial vein was accessed under ultrasound guidance with a 21 gauge micropuncture set at the proximal  calf level. Venography was performed. The micropuncture dilator was exchanged for a 8 [place] vascular sheath, through which a 5 French angled angiographic catheter was advanced for left lower extremity venography and inferior vena cavography. In similar fashion, on the right,the posterior tibial vein was accessed under ultrasound guidance with a 21 gauge micropuncture set at the proximal calf level. Venography was performed. The micropuncture dilator was exchanged for a 8 [place] vascular sheath, through which a 5 French angled angiographic catheter was advanced for right lower extremity venography and advanced into the IVC. The angiographic catheters were exchanged for Rosen wires, both advanced up into the SVC. The AngioJetT ZelanteDVTT Thrombectomy Catheter was advanced through the right sheath into the IVC, and activated for thrombectomy of the caval and iliac DVT. In similar fashion, the catheter was advanced through the left sheath into the IVC over the guidewire. Patient complained of chest pain, and cardiac arrhythmias were identified. Blood pressure and O2 sats remained stable. The catheter was activated for thrombectomy of the caval and iliac DVT during withdrawal of the catheter. Thrombectomy was  terminated. No further venography was performed. Bilateral infusion catheters were placed, 135 cm on the left and 95 cm on the right, both with 50 cm infusion lengths, placed to treat both iliac venous systems in the IVC through the thrombosed segments. Catheters and sheaths were secured externally with 0 silk suture and Steri-Strips, and covered with a sterile dressing. Patient was transferred to the ICU and bilateral tPA infusion initiated per protocol. COMPLICATIONS: Chest pain and cardiac arrhythmia after partial mechanical thrombectomy. SIR level B: Nominal therapy (including overnight admission for observation), no consequence. FINDINGS: Limited bilateral lower extremity venography demonstrated incompletely occlusive DVT. Central venography demonstrated occlusive DVT in bilateral iliac veins. There is occlusive thrombus through the IVC from the iliac confluence to the level of renal vein inflow was, extending through the IVC filter. Partial mechanical thrombectomy was accomplished, abandon because of patient symptomatology. Plan is for overnight catheter directed thrombolysis with follow-up venography 01/08/2015. IMPRESSION: 1. Extensive occlusive acute thrombus through bilateral iliac venous systems and IVC, extending above the filter to the level of renal vein inflows. 2. Incompletely occlusive lower extremity DVT bilaterally. 3. Technically successful partial mechanical thrombolysis of iliocaval DVT as above. 4. Initiation of catheter directed tPA infusion through bilateral lower extremity infusion catheters. Patient will return 12/22 for follow-up venography and intervention as indicated. Electronically Signed   By: Corlis Leak M.D.   On: 01/08/2015 08:54   Ir US Guide Vasc Access Right  01/08/2015  CLINICAL DATA:  Bilateral lower extremity swelling and pain. 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 IVC filter in place. Doppler describes bilateral extensive lower extremity DVT. EXAM: IR INFUSION THROMBOL  VENOUS INITIAL (MS); BILATERAL EXTREMITY VENOGRAPHY; THROMBOECTOMY MECHANICAL VENOUS; IR ULTRASOUND GUIDANCE VASC ACCESS LEFT; IR ULTRASOUND GUIDANCE VASC ACCESS RIGHT; INFERIOR VENA CAVOGRAM ANESTHESIA/SEDATION: Intravenous Fentanyl and Versed were administered as conscious sedation during continuous cardiorespiratory monitoring by the radiology RN, with a total moderate sedation time of 45 minutes. MEDICATIONS: Lidocaine 1% subcutaneous CONTRAST:  40mL OMNIPAQUE IOHEXOL 300 MG/ML  SOLN PROCEDURE: The procedure, risks (including but not limited to bleeding, infection, organ damage ), benefits, and alternatives were explained to the patient. Questions regarding the procedure were encouraged and answered. The patient understands and consents to the procedure. Patient placed prone on the procedure table. Bilateral lower extremities prepped and draped in usual sterile fashion. Maximal barrier sterile technique was utilized including caps, mask, sterile gowns, sterile gloves, sterile drape,  hand hygiene and skin antiseptic. After time-out, moderate sedation was initiated. After the skin was infiltrated with lidocaine, the left posterior tibial vein was accessed under ultrasound guidance with a 21 gauge micropuncture set at the proximal calf level. Venography was performed. The micropuncture dilator was exchanged for a 8 [place] vascular sheath, through which a 5 French angled angiographic catheter was advanced for left lower extremity venography and inferior vena cavography. In similar fashion, on the right,the posterior tibial vein was accessed under ultrasound guidance with a 21 gauge micropuncture set at the proximal calf level. Venography was performed. The micropuncture dilator was exchanged for a 8 [place] vascular sheath, through which a 5 French angled angiographic catheter was advanced for right lower extremity venography and advanced into the IVC. The angiographic catheters were exchanged for Rosen wires, both  advanced up into the SVC. The AngioJetT ZelanteDVTT Thrombectomy Catheter was advanced through the right sheath into the IVC, and activated for thrombectomy of the caval and iliac DVT. In similar fashion, the catheter was advanced through the left sheath into the IVC over the guidewire. Patient complained of chest pain, and cardiac arrhythmias were identified. Blood pressure and O2 sats remained stable. The catheter was activated for thrombectomy of the caval and iliac DVT during withdrawal of the catheter. Thrombectomy was terminated. No further venography was performed. Bilateral infusion catheters were placed, 135 cm on the left and 95 cm on the right, both with 50 cm infusion lengths, placed to treat both iliac venous systems in the IVC through the thrombosed segments. Catheters and sheaths were secured externally with 0 silk suture and Steri-Strips, and covered with a sterile dressing. Patient was transferred to the ICU and bilateral tPA infusion initiated per protocol. COMPLICATIONS: Chest pain and cardiac arrhythmia after partial mechanical thrombectomy. SIR level B: Nominal therapy (including overnight admission for observation), no consequence. FINDINGS: Limited bilateral lower extremity venography demonstrated incompletely occlusive DVT. Central venography demonstrated occlusive DVT in bilateral iliac veins. There is occlusive thrombus through the IVC from the iliac confluence to the level of renal vein inflow was, extending through the IVC filter. Partial mechanical thrombectomy was accomplished, abandon because of patient symptomatology. Plan is for overnight catheter directed thrombolysis with follow-up venography 01/08/2015. IMPRESSION: 1. Extensive occlusive acute thrombus through bilateral iliac venous systems and IVC, extending above the filter to the level of renal vein inflows. 2. Incompletely occlusive lower extremity DVT bilaterally. 3. Technically successful partial mechanical thrombolysis of  iliocaval DVT as above. 4. Initiation of catheter directed tPA infusion through bilateral lower extremity infusion catheters. Patient will return 12/22 for follow-up venography and intervention as indicated. Electronically Signed   By: Corlis Leak M.D.   On: 01/08/2015 08:54   Ir Infusion Thrombol Venous Initial (ms)  01/08/2015  CLINICAL DATA:  Bilateral lower extremity swelling and pain. 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 IVC filter in place. Doppler describes bilateral extensive lower extremity DVT. EXAM: IR INFUSION THROMBOL VENOUS INITIAL (MS); BILATERAL EXTREMITY VENOGRAPHY; THROMBOECTOMY MECHANICAL VENOUS; IR ULTRASOUND GUIDANCE VASC ACCESS LEFT; IR ULTRASOUND GUIDANCE VASC ACCESS RIGHT; INFERIOR VENA CAVOGRAM ANESTHESIA/SEDATION: Intravenous Fentanyl and Versed were administered as conscious sedation during continuous cardiorespiratory monitoring by the radiology RN, with a total moderate sedation time of 45 minutes. MEDICATIONS: Lidocaine 1% subcutaneous CONTRAST:  40mL OMNIPAQUE IOHEXOL 300 MG/ML  SOLN PROCEDURE: The procedure, risks (including but not limited to bleeding, infection, organ damage ), benefits, and alternatives were explained to the patient. Questions regarding the procedure were encouraged and answered. The patient understands  and consents to the procedure. Patient placed prone on the procedure table. Bilateral lower extremities prepped and draped in usual sterile fashion. Maximal barrier sterile technique was utilized including caps, mask, sterile gowns, sterile gloves, sterile drape, hand hygiene and skin antiseptic. After time-out, moderate sedation was initiated. After the skin was infiltrated with lidocaine, the left posterior tibial vein was accessed under ultrasound guidance with a 21 gauge micropuncture set at the proximal calf level. Venography was performed. The micropuncture dilator was exchanged for a 8 [place] vascular sheath, through which a 5 French angled angiographic  catheter was advanced for left lower extremity venography and inferior vena cavography. In similar fashion, on the right,the posterior tibial vein was accessed under ultrasound guidance with a 21 gauge micropuncture set at the proximal calf level. Venography was performed. The micropuncture dilator was exchanged for a 8 [place] vascular sheath, through which a 5 French angled angiographic catheter was advanced for right lower extremity venography and advanced into the IVC. The angiographic catheters were exchanged for Rosen wires, both advanced up into the SVC. The AngioJetT ZelanteDVTT Thrombectomy Catheter was advanced through the right sheath into the IVC, and activated for thrombectomy of the caval and iliac DVT. In similar fashion, the catheter was advanced through the left sheath into the IVC over the guidewire. Patient complained of chest pain, and cardiac arrhythmias were identified. Blood pressure and O2 sats remained stable. The catheter was activated for thrombectomy of the caval and iliac DVT during withdrawal of the catheter. Thrombectomy was terminated. No further venography was performed. Bilateral infusion catheters were placed, 135 cm on the left and 95 cm on the right, both with 50 cm infusion lengths, placed to treat both iliac venous systems in the IVC through the thrombosed segments. Catheters and sheaths were secured externally with 0 silk suture and Steri-Strips, and covered with a sterile dressing. Patient was transferred to the ICU and bilateral tPA infusion initiated per protocol. COMPLICATIONS: Chest pain and cardiac arrhythmia after partial mechanical thrombectomy. SIR level B: Nominal therapy (including overnight admission for observation), no consequence. FINDINGS: Limited bilateral lower extremity venography demonstrated incompletely occlusive DVT. Central venography demonstrated occlusive DVT in bilateral iliac veins. There is occlusive thrombus through the IVC from the iliac  confluence to the level of renal vein inflow was, extending through the IVC filter. Partial mechanical thrombectomy was accomplished, abandon because of patient symptomatology. Plan is for overnight catheter directed thrombolysis with follow-up venography 01/08/2015. IMPRESSION: 1. Extensive occlusive acute thrombus through bilateral iliac venous systems and IVC, extending above the filter to the level of renal vein inflows. 2. Incompletely occlusive lower extremity DVT bilaterally. 3. Technically successful partial mechanical thrombolysis of iliocaval DVT as above. 4. Initiation of catheter directed tPA infusion through bilateral lower extremity infusion catheters. Patient will return 12/22 for follow-up venography and intervention as indicated. Electronically Signed   By: Corlis Leak  Hassell M.D.   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 The patient has received pharmacologic thrombolysis overnight via directed catheters. He returns for lysis check. EXAM: CONTINUATION OF THROMBOLYSIS WITH BILATERAL VENOGRAM LOWER EXTREMITIES AND CAVAGRAM FLUOROSCOPY TIME:  30 seconds MEDICATIONS AND MEDICAL HISTORY: None ANESTHESIA/SEDATION: 1.0 mg Versed, 50 mcg fentanyl CONTRAST:  150mL OMNIPAQUE IOHEXOL 300 MG/ML  SOLN COMPLICATIONS: None PROCEDURE: Informed consent was obtained from the patient following explanation of the procedure, risks, benefits and alternatives.  The patient understands, agrees and consents for the procedure. All questions were addressed. A time out was performed. The patient is position prone position on the IR table, and the bilateral popliteal region were prepped and draped in the usual sterile fashion including the indwelling catheters. Obturator wires were removed from the bilateral catheters, and pullback venogram was performed of the left and right lower  extremity, including a cavagram. All sheaths wires and catheters were then removed. Sterile bandages were placed at the popliteal insertion site bilaterally. Patient tolerated the procedure well and remained hemodynamically stable throughout. No complications were encountered and no significant blood loss encountered. FINDINGS: Initial image demonstrates bilateral thrombolysis catheters from the lower extremities unchanged in position above the level of the IVC filter. Venogram bilateral lower extremities and the cavagram demonstrates no residual thrombus within the bilateral iliac systems, with free flowing contrast through the cava. Small volume of lucent filling defects associated with the filter, potentially nonocclusive thrombus or chronic synechiae. Bilateral femoral popliteal venous system patent. IMPRESSION: Status post thrombolysis completion, with pullback venogram of the bilateral iliofemoral venous system and the vena cava with resolution of ileal caval thrombus and free-flowing venous system confirm. Minimal volume lucent filling defects associated with the IVC filter (appears to be a Trapease filter), potentially nonocclusive thrombus or chronic synechiae. Signed, Yvone Neu. Loreta Ave DO [facility] PLAN: Agree with continued anti coagulation. Bilateral compression dressing at the popliteal insertion sites overnight. Follow up visit with VIR in 2-4 weeks for discussion regarding possible treatment options regarding the filter. Electronically Signed   By: Gilmer Mor D.O.   On: 01/09/2015 08:19   Personally viewed.   Telemetry: No adverse rhythms Personally viewed.   EKG:  NSR, no ST changes Personally viewed.  Cardiac Studies:  ECHO normal EF. Normal RV  Meds: Scheduled Meds: . pantoprazole (PROTONIX) IV  40 mg Intravenous Q24H  . sodium chloride  3 mL Intravenous Q12H  . sodium chloride  3 mL Intravenous Q12H   Continuous  Infusions: . sodium chloride 125 mL/hr at 01/09/15 0800  . 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 ?renal stone playing a role  - recommend coumadin (has been on in the past several years ago)  - watch with thrombocytopenia.   - on heparin IV  PVD  - prior right iliac stent 2007  Thrombocytopenia  -improved today  -has seen hematology in the past he states  Zhanna Melin 01/09/2015, 10:44 AM

## 2015-01-10 LAB — CBC
HEMATOCRIT: 40.5 % (ref 39.0–52.0)
HEMOGLOBIN: 13.9 g/dL (ref 13.0–17.0)
MCH: 36.6 pg — ABNORMAL HIGH (ref 26.0–34.0)
MCHC: 34.3 g/dL (ref 30.0–36.0)
MCV: 106.6 fL — ABNORMAL HIGH (ref 78.0–100.0)
Platelets: 71 10*3/uL — ABNORMAL LOW (ref 150–400)
RBC: 3.8 MIL/uL — ABNORMAL LOW (ref 4.22–5.81)
RDW: 13.3 % (ref 11.5–15.5)
WBC: 5 10*3/uL (ref 4.0–10.5)

## 2015-01-10 LAB — PROTIME-INR
INR: 1.01 (ref 0.00–1.49)
PROTHROMBIN TIME: 13.5 s (ref 11.6–15.2)

## 2015-01-10 LAB — BASIC METABOLIC PANEL
Anion gap: 6 (ref 5–15)
BUN: 7 mg/dL (ref 6–20)
CHLORIDE: 107 mmol/L (ref 101–111)
CO2: 29 mmol/L (ref 22–32)
CREATININE: 1.15 mg/dL (ref 0.61–1.24)
Calcium: 8.6 mg/dL — ABNORMAL LOW (ref 8.9–10.3)
Glucose, Bld: 103 mg/dL — ABNORMAL HIGH (ref 65–99)
POTASSIUM: 3.8 mmol/L (ref 3.5–5.1)
SODIUM: 142 mmol/L (ref 135–145)

## 2015-01-10 LAB — HEPARIN LEVEL (UNFRACTIONATED): HEPARIN UNFRACTIONATED: 0.32 [IU]/mL (ref 0.30–0.70)

## 2015-01-10 MED ORDER — PNEUMOCOCCAL VAC POLYVALENT 25 MCG/0.5ML IJ INJ: 0.5000 mL | INJECTION | INTRAMUSCULAR | Status: DC

## 2015-01-10 MED ORDER — WARFARIN SODIUM 7.5 MG PO TABS
7.5000 mg | ORAL_TABLET | Freq: Once | ORAL | Status: AC
  Administered 2015-01-10: 7.5 mg via ORAL
  Filled 2015-01-10: qty 1

## 2015-01-10 NOTE — Progress Notes (Addendum)
  [facility] Progress Note  Subjective  - No complaints today. Minimal pain. Denies CP.    Objective Filed Vitals:   01/09/15 1947 01/10/15 0502  BP: 136/81 136/77  Pulse: 73 51  Temp: 98.3 F (36.8 C) 98.2 F (36.8 C)  Resp: 18 18    Intake/Output Summary (Last 24 hours) at 01/10/15 0753 Last data filed at 01/09/15 1630  Gross per 24 hour  Intake    924 ml  Output    700 ml  Net    224 ml   Bilateral lower extremities without cyanosis and edema.  Palpable DP pulses b/l B/l popliteal sheath sites without hematoma.   Assessment/Planning: 49 y.o. male with phlegmasia cerulea dolens b/l lower extremities s/p thrombolysis  -Legs back at baseline. Palpable pulses bilaterally.  -Recurrent DVT: heparin to coumadin. INR 1.01 today.  -UTI: asymptomatic. Cultures negative. No leukocytosis. Will not treat given lack of symptoms and negative cultures. Also due to concerns with antibiotic interactions with coumadin.  -Appreciate cardiology assistance. -Mobilize. -Home once INR >2   Raymond GurneyKimberly A Trinh 01/10/2015 7:53 AM --  Laboratory CBC    Component Value Date/Time   WBC 5.0 01/10/2015 0319   HGB 13.9 01/10/2015 0319   HCT 40.5 01/10/2015 0319   PLT 71* 01/10/2015 0319    BMET    Component Value Date/Time   NA 142 01/10/2015 0319   K 3.8 01/10/2015 0319   CL 107 01/10/2015 0319   CO2 29 01/10/2015 0319   GLUCOSE 103* 01/10/2015 0319   BUN 7 01/10/2015 0319   CREATININE 1.15 01/10/2015 0319   CALCIUM 8.6* 01/10/2015 0319   GFRNONAA >60 01/10/2015 0319   GFRAA >60 01/10/2015 0319    COAG Lab Results  Component Value Date   INR 1.01 01/10/2015   INR 1.19 01/07/2015   INR 0.96 02/19/2009   No results found for: PTT  Antibiotics Anti-infectives    None       Maris BergerKimberly Trinh, PA-C [facility] Office: [phone] Pager: [phone] 01/10/2015 7:53 AM     I have examined the patient, reviewed and agree with  above. No swelling in his lower extremities. Again discussed the critical importance of lifelong anticoagulation. Has had extensive workup for his hematuria with no cause identified. Home when therapeutic on Coumadin  Gretta BeganEarly, Brayah Urquilla, MD 01/10/2015 10:12 AM

## 2015-01-10 NOTE — Progress Notes (Signed)
Subjective:  Feeling much better on IV hep--->> Coumadin AC  Objective:  Temp:  [98.2 F (36.8 C)-98.3 F (36.8 C)] 98.2 F (36.8 C) (12/24 0502) Pulse Rate:  [51-77] 51 (12/24 0502) Resp:  [6-18] 18 (12/24 0502) BP: (111-147)/(69-111) 136/77 mmHg (12/24 0502) SpO2:  [94 %-100 %] 98 % (12/24 0502) Weight change:   Intake/Output from previous day: 12/23 0701 - 12/24 0700 In: 1284 [P.O.:720; I.V.:564] Out: 700 [Urine:700]  Intake/Output from this shift:    Physical Exam: General appearance: alert and no distress Neck: no adenopathy, no carotid bruit, no JVD, supple, symmetrical, trachea midline and thyroid not enlarged, symmetric, no tenderness/mass/nodules Lungs: clear to auscultation bilaterally Heart: regular rate and rhythm, S1, S2 normal, no murmur, click, rub or gallop Extremities: extremities normal, atraumatic, no cyanosis or edema and 2+ PP bilaterally  Lab Results: Results for orders placed or performed during the hospital encounter of 01/07/15 (from the past 48 hour(s))  Urine culture     Status: None   Collection Time: 01/08/15  8:25 AM  Result Value Ref Range   Specimen Description URINE, CATHETERIZED    Special Requests NONE    Culture NO GROWTH 1 DAY    Report Status 01/09/2015 FINAL   CBC every 6 hours x 4 post-procedure     Status: Abnormal   Collection Time: 01/08/15 12:14 PM  Result Value Ref Range   WBC 6.7 4.0 - 10.5 K/uL   RBC 3.88 (L) 4.22 - 5.81 MIL/uL   Hemoglobin 14.0 13.0 - 17.0 g/dL   HCT 42.1 39.0 - 52.0 %   MCV 108.5 (H) 78.0 - 100.0 fL   MCH 36.1 (H) 26.0 - 34.0 pg   MCHC 33.3 30.0 - 36.0 g/dL   RDW 14.0 11.5 - 15.5 %   Platelets 44 (L) 150 - 400 K/uL    Comment: CONSISTENT WITH PREVIOUS RESULT  Fibrinogen every 6 hours x 4 post-procedure     Status: None   Collection Time: 01/08/15 12:14 PM  Result Value Ref Range   Fibrinogen 305 204 - 475 mg/dL  Heparin level (unfractionated) every 6 hours x 4 post-procedure     Status:  Abnormal   Collection Time: 01/08/15 12:14 PM  Result Value Ref Range   Heparin Unfractionated 0.27 (L) 0.30 - 0.70 IU/mL    Comment:        IF HEPARIN RESULTS ARE BELOW EXPECTED VALUES, AND PATIENT DOSAGE HAS BEEN CONFIRMED, SUGGEST FOLLOW UP TESTING OF ANTITHROMBIN III LEVELS.   Heparin level (unfractionated)     Status: None   Collection Time: 01/08/15 12:14 PM  Result Value Ref Range   Heparin Unfractionated 0.30 0.30 - 0.70 IU/mL    Comment:        IF HEPARIN RESULTS ARE BELOW EXPECTED VALUES, AND PATIENT DOSAGE HAS BEEN CONFIRMED, SUGGEST FOLLOW UP TESTING OF ANTITHROMBIN III LEVELS.   Heparin level (unfractionated)     Status: None   Collection Time: 01/08/15  9:21 PM  Result Value Ref Range   Heparin Unfractionated 0.31 0.30 - 0.70 IU/mL    Comment:        IF HEPARIN RESULTS ARE BELOW EXPECTED VALUES, AND PATIENT DOSAGE HAS BEEN CONFIRMED, SUGGEST FOLLOW UP TESTING OF ANTITHROMBIN III LEVELS.   CBC     Status: Abnormal   Collection Time: 01/09/15  5:20 AM  Result Value Ref Range   WBC 6.2 4.0 - 10.5 K/uL   RBC 3.82 (L) 4.22 - 5.81 MIL/uL  Hemoglobin 13.9 13.0 - 17.0 g/dL    Comment: CONSISTENT WITH PREVIOUS RESULT   HCT 41.3 39.0 - 52.0 %   MCV 108.1 (H) 78.0 - 100.0 fL   MCH 36.4 (H) 26.0 - 34.0 pg   MCHC 33.7 30.0 - 36.0 g/dL   RDW 13.8 11.5 - 15.5 %   Platelets 55 (L) 150 - 400 K/uL    Comment: CONSISTENT WITH PREVIOUS RESULT  Heparin level (unfractionated)     Status: None   Collection Time: 01/09/15  5:20 AM  Result Value Ref Range   Heparin Unfractionated 0.32 0.30 - 0.70 IU/mL    Comment:        IF HEPARIN RESULTS ARE BELOW EXPECTED VALUES, AND PATIENT DOSAGE HAS BEEN CONFIRMED, SUGGEST FOLLOW UP TESTING OF ANTITHROMBIN III LEVELS.   Basic metabolic panel     Status: Abnormal   Collection Time: 01/09/15 10:15 AM  Result Value Ref Range   Sodium 142 135 - 145 mmol/L   Potassium 3.9 3.5 - 5.1 mmol/L   Chloride 109 101 - 111 mmol/L   CO2 27  22 - 32 mmol/L   Glucose, Bld 137 (H) 65 - 99 mg/dL   BUN 7 6 - 20 mg/dL   Creatinine, Ser 1.18 0.61 - 1.24 mg/dL   Calcium 8.2 (L) 8.9 - 10.3 mg/dL   GFR calc non Af Amer >60 >60 mL/min   GFR calc Af Amer >60 >60 mL/min    Comment: (NOTE) The eGFR has been calculated using the CKD EPI equation. This calculation has not been validated in all clinical situations. eGFR's persistently <60 mL/min signify possible Chronic Kidney Disease.    Anion gap 6 5 - 15  CBC     Status: Abnormal   Collection Time: 01/10/15  3:19 AM  Result Value Ref Range   WBC 5.0 4.0 - 10.5 K/uL   RBC 3.80 (L) 4.22 - 5.81 MIL/uL   Hemoglobin 13.9 13.0 - 17.0 g/dL   HCT 40.5 39.0 - 52.0 %   MCV 106.6 (H) 78.0 - 100.0 fL   MCH 36.6 (H) 26.0 - 34.0 pg   MCHC 34.3 30.0 - 36.0 g/dL   RDW 13.3 11.5 - 15.5 %   Platelets 71 (L) 150 - 400 K/uL    Comment: CONSISTENT WITH PREVIOUS RESULT  Heparin level (unfractionated)     Status: None   Collection Time: 01/10/15  3:19 AM  Result Value Ref Range   Heparin Unfractionated 0.32 0.30 - 0.70 IU/mL    Comment:        IF HEPARIN RESULTS ARE BELOW EXPECTED VALUES, AND PATIENT DOSAGE HAS BEEN CONFIRMED, SUGGEST FOLLOW UP TESTING OF ANTITHROMBIN III LEVELS.   Basic metabolic panel     Status: Abnormal   Collection Time: 01/10/15  3:19 AM  Result Value Ref Range   Sodium 142 135 - 145 mmol/L   Potassium 3.8 3.5 - 5.1 mmol/L   Chloride 107 101 - 111 mmol/L   CO2 29 22 - 32 mmol/L   Glucose, Bld 103 (H) 65 - 99 mg/dL   BUN 7 6 - 20 mg/dL   Creatinine, Ser 1.15 0.61 - 1.24 mg/dL   Calcium 8.6 (L) 8.9 - 10.3 mg/dL   GFR calc non Af Amer >60 >60 mL/min   GFR calc Af Amer >60 >60 mL/min    Comment: (NOTE) The eGFR has been calculated using the CKD EPI equation. This calculation has not been validated in all clinical situations. eGFR's persistently <60 mL/min  signify possible Chronic Kidney Disease.    Anion gap 6 5 - 15  Protime-INR     Status: None   Collection  Time: 01/10/15  3:19 AM  Result Value Ref Range   Prothrombin Time 13.5 11.6 - 15.2 seconds   INR 1.01 0.00 - 1.49    Imaging: Imaging results have been reviewed  Assessment/Plan:   1. Principal Problem: 2.   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 Active Problems: 4.   Thrombocytopenia (Aaron Cunningham) 5.   PVD (peripheral vascular disease) (Aaron Cunningham) 6.   Wide-complex tachycardia (Aaron Cunningham) 7.   Demand ischemia (Aaron Cunningham) 8.   Time Spent Directly with Patient:  15 minutes  Length of Stay:  LOS: 3 days   S/P lysis of thrombosed IVC filter secondary to stopping Eliquis because of hematuria. Now on IV hep--->> coumadin AC. INR 1.1 (Goal > 2.0). Lower extremities clinically improved. Plts increasing 44--->> 71K. 2D nl. Will S/O. Call if we can be of further assistance.   Aaron Cunningham 01/10/2015, 7:37 AM

## 2015-01-10 NOTE — Progress Notes (Signed)
ANTICOAGULATION CONSULT NOTE - Follow Up Consult  Pharmacy Consult for heparin + warfarin Indication: acute thrombosis w/ EKOS   Labs:  Recent Labs  01/07/15 1319  01/07/15 1918 01/08/15 0017 01/08/15 0321 01/08/15 0542 01/08/15 1214 01/08/15 2121 01/09/15 0520 01/09/15 1015 01/10/15 0319  HGB 19.8*  < > 17.5* 15.9  --  15.2 14.0  --  13.9  --  13.9  HCT 56.3*  < > 49.8 47.6  --  45.4 42.1  --  41.3  --  40.5  PLT 72*  --  50* 46*  --  48* 44*  --  55*  --  71*  APTT 165*  --   --   --   --   --   --   --   --   --   --   LABPROT 15.3*  --   --   --   --   --   --   --   --   --  13.5  INR 1.19  --   --   --   --   --   --   --   --   --  1.01  HEPARINUNFRC 1.06*  --   --  0.18*  --  0.28* 0.30  0.27* 0.31 0.32  --  0.32  CREATININE 1.60*  < > 1.79*  --  1.74*  --   --   --   --  1.18 1.15  TROPONINI  --   --  0.07* 0.52*  --  0.64*  --   --   --   --   --   < > = values in this interval not displayed.  Assessment: 49yo male continues on IV heparin and warfarin s/p EKOS- overlap day #2 of 5. Platelets are low but up to 71 and have been stable the last few lab draws. Heparin level remains therapeutic at 0.32. INR 1.01. No new bleeding noted.    Goal of Therapy:  Heparin level 0.3-0.7 units/ml   Plan:  - Continue heparin gtt 1600 units/hr - Daily heparin level and CBC - Warfarin 7.5mg  PO x 1 tonight - Daily INR  Link SnufferJessica Kiarah Eckstein, PharmD, BCPS Clinical Pharmacist [phone] 01/10/2015 7:12 AM

## 2015-01-11 LAB — PROTIME-INR
INR: 1.04 (ref 0.00–1.49)
PROTHROMBIN TIME: 13.8 s (ref 11.6–15.2)

## 2015-01-11 LAB — HEPARIN LEVEL (UNFRACTIONATED)
HEPARIN UNFRACTIONATED: 0.24 [IU]/mL — AB (ref 0.30–0.70)
Heparin Unfractionated: 0.32 IU/mL (ref 0.30–0.70)

## 2015-01-11 MED ORDER — WARFARIN SODIUM 10 MG PO TABS
10.0000 mg | ORAL_TABLET | Freq: Once | ORAL | Status: AC
  Administered 2015-01-11: 10 mg via ORAL
  Filled 2015-01-11: qty 1

## 2015-01-11 NOTE — Progress Notes (Signed)
ANTICOAGULATION CONSULT NOTE - Pharmacy Consult for heparin + warfarin Indication: acute thrombosis w/ EKOS   Labs:  Recent Labs  01/08/15 0542 01/08/15 1214  01/09/15 0520 01/09/15 1015 01/10/15 0319 01/11/15 0403  HGB 15.2 14.0  --  13.9  --  13.9  --   HCT 45.4 42.1  --  41.3  --  40.5  --   PLT 48* 44*  --  55*  --  71*  --   LABPROT  --   --   --   --   --  13.5 13.8  INR  --   --   --   --   --  1.01 1.04  HEPARINUNFRC 0.28* 0.30  0.27*  < > 0.32  --  0.32 0.24*  CREATININE  --   --   --   --  1.18 1.15  --   TROPONINI 0.64*  --   --   --   --   --   --   < > = values in this interval not displayed.  Assessment: 49 yo male with h/o DVT s/p lysis of IVC filter occlusion, for anticoagulation  Goal of Therapy:  INR 2-3 Heparin level 0.3-0.7 units/ml   Plan:  Increase Heparin 1750 units/hr Check heparin level in 8 hours.  Coumadin 10 mg today  Geannie RisenGreg Yaritzel Stange, PharmD, BCPS  01/11/2015 5:14 AM

## 2015-01-11 NOTE — Progress Notes (Signed)
ANTICOAGULATION CONSULT NOTE - Pharmacy Consult for heparin + warfarin Indication: acute thrombosis w/ EKOS   Labs:  Recent Labs  01/09/15 0520 01/09/15 1015 01/10/15 0319 01/11/15 0403 01/11/15 1351  HGB 13.9  --  13.9  --   --   HCT 41.3  --  40.5  --   --   PLT 55*  --  71*  --   --   LABPROT  --   --  13.5 13.8  --   INR  --   --  1.01 1.04  --   HEPARINUNFRC 0.32  --  0.32 0.24* 0.32  CREATININE  --  1.18 1.15  --   --     Assessment: 49 yo male with h/o DVT s/p lysis of IVC filter occlusion, for anticoagulation HL therapeutic this afternoon  Goal of Therapy:  INR 2-3 Heparin level 0.3-0.7 units/ml   Plan:  Continue heparin at 1750 units / hr Follow up AM labs  Thank you Okey RegalLisa Waymond Meador, PharmD [phone]  01/11/2015 2:41 PM

## 2015-01-11 NOTE — Progress Notes (Signed)
Subjective: Interval History: none.. No new pain or swelling in his lower extremities.  Objective: Vital signs in last 24 hours: Temp:  [97.6 F (36.4 C)-97.8 F (36.6 C)] 97.8 F (36.6 C) (12/25 0440) Pulse Rate:  [52-57] 52 (12/25 0440) Resp:  [16-20] 16 (12/25 0440) BP: (142-176)/(77-91) 142/77 mmHg (12/25 0440) SpO2:  [92 %-100 %] 92 % (12/25 0440)  Intake/Output from previous day: 12/24 0701 - 12/25 0700 In: 1080 [P.O.:1080] Out: 400 [Urine:400] Intake/Output this shift: Total I/O In: 360 [P.O.:360] Out: 650 [Urine:650]  Lower extremities without pain or swelling  Lab Results:  Recent Labs  01/09/15 0520 01/10/15 0319  WBC 6.2 5.0  HGB 13.9 13.9  HCT 41.3 40.5  PLT 55* 71*   BMET  Recent Labs  01/09/15 1015 01/10/15 0319  NA 142 142  K 3.9 3.8  CL 109 107  CO2 27 29  GLUCOSE 137* 103*  BUN 7 7  CREATININE 1.18 1.15  CALCIUM 8.2* 8.6*    Studies/Results: Ir Veno/ext/bi  January 22, 2015  CLINICAL DATA:  Bilateral lower extremity swelling and pain. 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 IVC filter in place. Doppler describes bilateral extensive lower extremity DVT. EXAM: IR INFUSION THROMBOL VENOUS INITIAL (MS); BILATERAL EXTREMITY VENOGRAPHY; THROMBOECTOMY MECHANICAL VENOUS; IR ULTRASOUND GUIDANCE VASC ACCESS LEFT; IR ULTRASOUND GUIDANCE VASC ACCESS RIGHT; INFERIOR VENA CAVOGRAM ANESTHESIA/SEDATION: Intravenous Fentanyl and Versed were administered as conscious sedation during continuous cardiorespiratory monitoring by the radiology RN, with a total moderate sedation time of 45 minutes. MEDICATIONS: Lidocaine 1% subcutaneous CONTRAST:  40mL OMNIPAQUE IOHEXOL 300 MG/ML  SOLN PROCEDURE: The procedure, risks (including but not limited to bleeding, infection, organ damage ), benefits, and alternatives were explained to the patient. Questions regarding the procedure were encouraged and answered. The patient understands and consents to the procedure. Patient placed prone  on the procedure table. Bilateral lower extremities prepped and draped in usual sterile fashion. Maximal barrier sterile technique was utilized including caps, mask, sterile gowns, sterile gloves, sterile drape, hand hygiene and skin antiseptic. After time-out, moderate sedation was initiated. After the skin was infiltrated with lidocaine, the left posterior tibial vein was accessed under ultrasound guidance with a 21 gauge micropuncture set at the proximal calf level. Venography was performed. The micropuncture dilator was exchanged for a 8 [place] vascular sheath, through which a 5 French angled angiographic catheter was advanced for left lower extremity venography and inferior vena cavography. In similar fashion, on the right,the posterior tibial vein was accessed under ultrasound guidance with a 21 gauge micropuncture set at the proximal calf level. Venography was performed. The micropuncture dilator was exchanged for a 8 [place] vascular sheath, through which a 5 French angled angiographic catheter was advanced for right lower extremity venography and advanced into the IVC. The angiographic catheters were exchanged for Rosen wires, both advanced up into the SVC. The AngioJetT ZelanteDVTT Thrombectomy Catheter was advanced through the right sheath into the IVC, and activated for thrombectomy of the caval and iliac DVT. In similar fashion, the catheter was advanced through the left sheath into the IVC over the guidewire. Patient complained of chest pain, and cardiac arrhythmias were identified. Blood pressure and O2 sats remained stable. The catheter was activated for thrombectomy of the caval and iliac DVT during withdrawal of the catheter. Thrombectomy was terminated. No further venography was performed. Bilateral infusion catheters were placed, 135 cm on the left and 95 cm on the right, both with 50 cm infusion lengths, placed to treat both iliac venous systems in the  IVC through the thrombosed segments.  Catheters and sheaths were secured externally with 0 silk suture and Steri-Strips, and covered with a sterile dressing. Patient was transferred to the ICU and bilateral tPA infusion initiated per protocol. COMPLICATIONS: Chest pain and cardiac arrhythmia after partial mechanical thrombectomy. SIR level B: Nominal therapy (including overnight admission for observation), no consequence. FINDINGS: Limited bilateral lower extremity venography demonstrated incompletely occlusive DVT. Central venography demonstrated occlusive DVT in bilateral iliac veins. There is occlusive thrombus through the IVC from the iliac confluence to the level of renal vein inflow was, extending through the IVC filter. Partial mechanical thrombectomy was accomplished, abandon because of patient symptomatology. Plan is for overnight catheter directed thrombolysis with follow-up venography 01/08/2015. IMPRESSION: 1. Extensive occlusive acute thrombus through bilateral iliac venous systems and IVC, extending above the filter to the level of renal vein inflows. 2. Incompletely occlusive lower extremity DVT bilaterally. 3. Technically successful partial mechanical thrombolysis of iliocaval DVT as above. 4. Initiation of catheter directed tPA infusion through bilateral lower extremity infusion catheters. Patient will return 12/22 for follow-up venography and intervention as indicated. Electronically Signed   By: Corlis Leak M.D.   On: 01/08/2015 08:54   Ir Caffie Damme Ivc  01/08/2015  CLINICAL DATA:  Bilateral lower extremity swelling and pain. 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 IVC filter in place. Doppler describes bilateral extensive lower extremity DVT. EXAM: IR INFUSION THROMBOL VENOUS INITIAL (MS); BILATERAL EXTREMITY VENOGRAPHY; THROMBOECTOMY MECHANICAL VENOUS; IR ULTRASOUND GUIDANCE VASC ACCESS LEFT; IR ULTRASOUND GUIDANCE VASC ACCESS RIGHT; INFERIOR VENA CAVOGRAM ANESTHESIA/SEDATION: Intravenous Fentanyl and Versed were administered as  conscious sedation during continuous cardiorespiratory monitoring by the radiology RN, with a total moderate sedation time of 45 minutes. MEDICATIONS: Lidocaine 1% subcutaneous CONTRAST:  40mL OMNIPAQUE IOHEXOL 300 MG/ML  SOLN PROCEDURE: The procedure, risks (including but not limited to bleeding, infection, organ damage ), benefits, and alternatives were explained to the patient. Questions regarding the procedure were encouraged and answered. The patient understands and consents to the procedure. Patient placed prone on the procedure table. Bilateral lower extremities prepped and draped in usual sterile fashion. Maximal barrier sterile technique was utilized including caps, mask, sterile gowns, sterile gloves, sterile drape, hand hygiene and skin antiseptic. After time-out, moderate sedation was initiated. After the skin was infiltrated with lidocaine, the left posterior tibial vein was accessed under ultrasound guidance with a 21 gauge micropuncture set at the proximal calf level. Venography was performed. The micropuncture dilator was exchanged for a 8 [place] vascular sheath, through which a 5 French angled angiographic catheter was advanced for left lower extremity venography and inferior vena cavography. In similar fashion, on the right,the posterior tibial vein was accessed under ultrasound guidance with a 21 gauge micropuncture set at the proximal calf level. Venography was performed. The micropuncture dilator was exchanged for a 8 [place] vascular sheath, through which a 5 French angled angiographic catheter was advanced for right lower extremity venography and advanced into the IVC. The angiographic catheters were exchanged for Rosen wires, both advanced up into the SVC. The AngioJetT ZelanteDVTT Thrombectomy Catheter was advanced through the right sheath into the IVC, and activated for thrombectomy of the caval and iliac DVT. In similar fashion, the catheter was advanced through the left sheath into the IVC  over the guidewire. Patient complained of chest pain, and cardiac arrhythmias were identified. Blood pressure and O2 sats remained stable. The catheter was activated for thrombectomy of the caval and iliac DVT during withdrawal of the catheter. Thrombectomy was terminated. No  further venography was performed. Bilateral infusion catheters were placed, 135 cm on the left and 95 cm on the right, both with 50 cm infusion lengths, placed to treat both iliac venous systems in the IVC through the thrombosed segments. Catheters and sheaths were secured externally with 0 silk suture and Steri-Strips, and covered with a sterile dressing. Patient was transferred to the ICU and bilateral tPA infusion initiated per protocol. COMPLICATIONS: Chest pain and cardiac arrhythmia after partial mechanical thrombectomy. SIR level B: Nominal therapy (including overnight admission for observation), no consequence. FINDINGS: Limited bilateral lower extremity venography demonstrated incompletely occlusive DVT. Central venography demonstrated occlusive DVT in bilateral iliac veins. There is occlusive thrombus through the IVC from the iliac confluence to the level of renal vein inflow was, extending through the IVC filter. Partial mechanical thrombectomy was accomplished, abandon because of patient symptomatology. Plan is for overnight catheter directed thrombolysis with follow-up venography 01/08/2015. IMPRESSION: 1. Extensive occlusive acute thrombus through bilateral iliac venous systems and IVC, extending above the filter to the level of renal vein inflows. 2. Incompletely occlusive lower extremity DVT bilaterally. 3. Technically successful partial mechanical thrombolysis of iliocaval DVT as above. 4. Initiation of catheter directed tPA infusion through bilateral lower extremity infusion catheters. Patient will return 12/22 for follow-up venography and intervention as indicated. Electronically Signed   By: Corlis Leak M.D.   On: 01/08/2015  08:54   Ir Thrombect Veno Mech Mod Sed  01/08/2015  CLINICAL DATA:  Bilateral lower extremity swelling and pain. 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 IVC filter in place. Doppler describes bilateral extensive lower extremity DVT. EXAM: IR INFUSION THROMBOL VENOUS INITIAL (MS); BILATERAL EXTREMITY VENOGRAPHY; THROMBOECTOMY MECHANICAL VENOUS; IR ULTRASOUND GUIDANCE VASC ACCESS LEFT; IR ULTRASOUND GUIDANCE VASC ACCESS RIGHT; INFERIOR VENA CAVOGRAM ANESTHESIA/SEDATION: Intravenous Fentanyl and Versed were administered as conscious sedation during continuous cardiorespiratory monitoring by the radiology RN, with a total moderate sedation time of 45 minutes. MEDICATIONS: Lidocaine 1% subcutaneous CONTRAST:  40mL OMNIPAQUE IOHEXOL 300 MG/ML  SOLN PROCEDURE: The procedure, risks (including but not limited to bleeding, infection, organ damage ), benefits, and alternatives were explained to the patient. Questions regarding the procedure were encouraged and answered. The patient understands and consents to the procedure. Patient placed prone on the procedure table. Bilateral lower extremities prepped and draped in usual sterile fashion. Maximal barrier sterile technique was utilized including caps, mask, sterile gowns, sterile gloves, sterile drape, hand hygiene and skin antiseptic. After time-out, moderate sedation was initiated. After the skin was infiltrated with lidocaine, the left posterior tibial vein was accessed under ultrasound guidance with a 21 gauge micropuncture set at the proximal calf level. Venography was performed. The micropuncture dilator was exchanged for a 8 [place] vascular sheath, through which a 5 French angled angiographic catheter was advanced for left lower extremity venography and inferior vena cavography. In similar fashion, on the right,the posterior tibial vein was accessed under ultrasound guidance with a 21 gauge micropuncture set at the proximal calf level. Venography was performed. The  micropuncture dilator was exchanged for a 8 [place] vascular sheath, through which a 5 French angled angiographic catheter was advanced for right lower extremity venography and advanced into the IVC. The angiographic catheters were exchanged for Rosen wires, both advanced up into the SVC. The AngioJetT ZelanteDVTT Thrombectomy Catheter was advanced through the right sheath into the IVC, and activated for thrombectomy of the caval and iliac DVT. In similar fashion, the catheter was advanced through the left sheath into the IVC over the guidewire. Patient  complained of chest pain, and cardiac arrhythmias were identified. Blood pressure and O2 sats remained stable. The catheter was activated for thrombectomy of the caval and iliac DVT during withdrawal of the catheter. Thrombectomy was terminated. No further venography was performed. Bilateral infusion catheters were placed, 135 cm on the left and 95 cm on the right, both with 50 cm infusion lengths, placed to treat both iliac venous systems in the IVC through the thrombosed segments. Catheters and sheaths were secured externally with 0 silk suture and Steri-Strips, and covered with a sterile dressing. Patient was transferred to the ICU and bilateral tPA infusion initiated per protocol. COMPLICATIONS: Chest pain and cardiac arrhythmia after partial mechanical thrombectomy. SIR level B: Nominal therapy (including overnight admission for observation), no consequence. FINDINGS: Limited bilateral lower extremity venography demonstrated incompletely occlusive DVT. Central venography demonstrated occlusive DVT in bilateral iliac veins. There is occlusive thrombus through the IVC from the iliac confluence to the level of renal vein inflow was, extending through the IVC filter. Partial mechanical thrombectomy was accomplished, abandon because of patient symptomatology. Plan is for overnight catheter directed thrombolysis with follow-up venography 01/08/2015. IMPRESSION: 1.  Extensive occlusive acute thrombus through bilateral iliac venous systems and IVC, extending above the filter to the level of renal vein inflows. 2. Incompletely occlusive lower extremity DVT bilaterally. 3. Technically successful partial mechanical thrombolysis of iliocaval DVT as above. 4. Initiation of catheter directed tPA infusion through bilateral lower extremity infusion catheters. Patient will return 12/22 for follow-up venography and intervention as indicated. Electronically Signed   By: Corlis Leak M.D.   On: 01/08/2015 08:54   Ir US Guide Vasc Access Left  01/08/2015  CLINICAL DATA:  Bilateral lower extremity swelling and pain. 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 IVC filter in place. Doppler describes bilateral extensive lower extremity DVT. EXAM: IR INFUSION THROMBOL VENOUS INITIAL (MS); BILATERAL EXTREMITY VENOGRAPHY; THROMBOECTOMY MECHANICAL VENOUS; IR ULTRASOUND GUIDANCE VASC ACCESS LEFT; IR ULTRASOUND GUIDANCE VASC ACCESS RIGHT; INFERIOR VENA CAVOGRAM ANESTHESIA/SEDATION: Intravenous Fentanyl and Versed were administered as conscious sedation during continuous cardiorespiratory monitoring by the radiology RN, with a total moderate sedation time of 45 minutes. MEDICATIONS: Lidocaine 1% subcutaneous CONTRAST:  40mL OMNIPAQUE IOHEXOL 300 MG/ML  SOLN PROCEDURE: The procedure, risks (including but not limited to bleeding, infection, organ damage ), benefits, and alternatives were explained to the patient. Questions regarding the procedure were encouraged and answered. The patient understands and consents to the procedure. Patient placed prone on the procedure table. Bilateral lower extremities prepped and draped in usual sterile fashion. Maximal barrier sterile technique was utilized including caps, mask, sterile gowns, sterile gloves, sterile drape, hand hygiene and skin antiseptic. After time-out, moderate sedation was initiated. After the skin was infiltrated with lidocaine, the left posterior  tibial vein was accessed under ultrasound guidance with a 21 gauge micropuncture set at the proximal calf level. Venography was performed. The micropuncture dilator was exchanged for a 8 [place] vascular sheath, through which a 5 French angled angiographic catheter was advanced for left lower extremity venography and inferior vena cavography. In similar fashion, on the right,the posterior tibial vein was accessed under ultrasound guidance with a 21 gauge micropuncture set at the proximal calf level. Venography was performed. The micropuncture dilator was exchanged for a 8 [place] vascular sheath, through which a 5 French angled angiographic catheter was advanced for right lower extremity venography and advanced into the IVC. The angiographic catheters were exchanged for Rosen wires, both advanced up into the SVC. The AngioJetT ZelanteDVTT Thrombectomy Catheter  was advanced through the right sheath into the IVC, and activated for thrombectomy of the caval and iliac DVT. In similar fashion, the catheter was advanced through the left sheath into the IVC over the guidewire. Patient complained of chest pain, and cardiac arrhythmias were identified. Blood pressure and O2 sats remained stable. The catheter was activated for thrombectomy of the caval and iliac DVT during withdrawal of the catheter. Thrombectomy was terminated. No further venography was performed. Bilateral infusion catheters were placed, 135 cm on the left and 95 cm on the right, both with 50 cm infusion lengths, placed to treat both iliac venous systems in the IVC through the thrombosed segments. Catheters and sheaths were secured externally with 0 silk suture and Steri-Strips, and covered with a sterile dressing. Patient was transferred to the ICU and bilateral tPA infusion initiated per protocol. COMPLICATIONS: Chest pain and cardiac arrhythmia after partial mechanical thrombectomy. SIR level B: Nominal therapy (including overnight admission for  observation), no consequence. FINDINGS: Limited bilateral lower extremity venography demonstrated incompletely occlusive DVT. Central venography demonstrated occlusive DVT in bilateral iliac veins. There is occlusive thrombus through the IVC from the iliac confluence to the level of renal vein inflow was, extending through the IVC filter. Partial mechanical thrombectomy was accomplished, abandon because of patient symptomatology. Plan is for overnight catheter directed thrombolysis with follow-up venography 01/08/2015. IMPRESSION: 1. Extensive occlusive acute thrombus through bilateral iliac venous systems and IVC, extending above the filter to the level of renal vein inflows. 2. Incompletely occlusive lower extremity DVT bilaterally. 3. Technically successful partial mechanical thrombolysis of iliocaval DVT as above. 4. Initiation of catheter directed tPA infusion through bilateral lower extremity infusion catheters. Patient will return 12/22 for follow-up venography and intervention as indicated. Electronically Signed   By: Corlis Leak M.D.   On: 01/08/2015 08:54   Ir US Guide Vasc Access Right  01/08/2015  CLINICAL DATA:  Bilateral lower extremity swelling and pain. 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 IVC filter in place. Doppler describes bilateral extensive lower extremity DVT. EXAM: IR INFUSION THROMBOL VENOUS INITIAL (MS); BILATERAL EXTREMITY VENOGRAPHY; THROMBOECTOMY MECHANICAL VENOUS; IR ULTRASOUND GUIDANCE VASC ACCESS LEFT; IR ULTRASOUND GUIDANCE VASC ACCESS RIGHT; INFERIOR VENA CAVOGRAM ANESTHESIA/SEDATION: Intravenous Fentanyl and Versed were administered as conscious sedation during continuous cardiorespiratory monitoring by the radiology RN, with a total moderate sedation time of 45 minutes. MEDICATIONS: Lidocaine 1% subcutaneous CONTRAST:  40mL OMNIPAQUE IOHEXOL 300 MG/ML  SOLN PROCEDURE: The procedure, risks (including but not limited to bleeding, infection, organ damage ), benefits, and  alternatives were explained to the patient. Questions regarding the procedure were encouraged and answered. The patient understands and consents to the procedure. Patient placed prone on the procedure table. Bilateral lower extremities prepped and draped in usual sterile fashion. Maximal barrier sterile technique was utilized including caps, mask, sterile gowns, sterile gloves, sterile drape, hand hygiene and skin antiseptic. After time-out, moderate sedation was initiated. After the skin was infiltrated with lidocaine, the left posterior tibial vein was accessed under ultrasound guidance with a 21 gauge micropuncture set at the proximal calf level. Venography was performed. The micropuncture dilator was exchanged for a 8 [place] vascular sheath, through which a 5 French angled angiographic catheter was advanced for left lower extremity venography and inferior vena cavography. In similar fashion, on the right,the posterior tibial vein was accessed under ultrasound guidance with a 21 gauge micropuncture set at the proximal calf level. Venography was performed. The micropuncture dilator was exchanged for a 8 [place] vascular sheath, through which  a 5 French angled angiographic catheter was advanced for right lower extremity venography and advanced into the IVC. The angiographic catheters were exchanged for Rosen wires, both advanced up into the SVC. The AngioJetT ZelanteDVTT Thrombectomy Catheter was advanced through the right sheath into the IVC, and activated for thrombectomy of the caval and iliac DVT. In similar fashion, the catheter was advanced through the left sheath into the IVC over the guidewire. Patient complained of chest pain, and cardiac arrhythmias were identified. Blood pressure and O2 sats remained stable. The catheter was activated for thrombectomy of the caval and iliac DVT during withdrawal of the catheter. Thrombectomy was terminated. No further venography was performed. Bilateral infusion catheters  were placed, 135 cm on the left and 95 cm on the right, both with 50 cm infusion lengths, placed to treat both iliac venous systems in the IVC through the thrombosed segments. Catheters and sheaths were secured externally with 0 silk suture and Steri-Strips, and covered with a sterile dressing. Patient was transferred to the ICU and bilateral tPA infusion initiated per protocol. COMPLICATIONS: Chest pain and cardiac arrhythmia after partial mechanical thrombectomy. SIR level B: Nominal therapy (including overnight admission for observation), no consequence. FINDINGS: Limited bilateral lower extremity venography demonstrated incompletely occlusive DVT. Central venography demonstrated occlusive DVT in bilateral iliac veins. There is occlusive thrombus through the IVC from the iliac confluence to the level of renal vein inflow was, extending through the IVC filter. Partial mechanical thrombectomy was accomplished, abandon because of patient symptomatology. Plan is for overnight catheter directed thrombolysis with follow-up venography 01/08/2015. IMPRESSION: 1. Extensive occlusive acute thrombus through bilateral iliac venous systems and IVC, extending above the filter to the level of renal vein inflows. 2. Incompletely occlusive lower extremity DVT bilaterally. 3. Technically successful partial mechanical thrombolysis of iliocaval DVT as above. 4. Initiation of catheter directed tPA infusion through bilateral lower extremity infusion catheters. Patient will return 12/22 for follow-up venography and intervention as indicated. Electronically Signed   By: Corlis Leak  Hassell M.D.   On: 01/08/2015 08:54   Ir Infusion Thrombol Venous Initial (ms)  01/08/2015  CLINICAL DATA:  Bilateral lower extremity swelling and pain. 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 IVC filter in place. Doppler describes bilateral extensive lower extremity DVT. EXAM: IR INFUSION THROMBOL VENOUS INITIAL (MS); BILATERAL EXTREMITY VENOGRAPHY; THROMBOECTOMY  MECHANICAL VENOUS; IR ULTRASOUND GUIDANCE VASC ACCESS LEFT; IR ULTRASOUND GUIDANCE VASC ACCESS RIGHT; INFERIOR VENA CAVOGRAM ANESTHESIA/SEDATION: Intravenous Fentanyl and Versed were administered as conscious sedation during continuous cardiorespiratory monitoring by the radiology RN, with a total moderate sedation time of 45 minutes. MEDICATIONS: Lidocaine 1% subcutaneous CONTRAST:  40mL OMNIPAQUE IOHEXOL 300 MG/ML  SOLN PROCEDURE: The procedure, risks (including but not limited to bleeding, infection, organ damage ), benefits, and alternatives were explained to the patient. Questions regarding the procedure were encouraged and answered. The patient understands and consents to the procedure. Patient placed prone on the procedure table. Bilateral lower extremities prepped and draped in usual sterile fashion. Maximal barrier sterile technique was utilized including caps, mask, sterile gowns, sterile gloves, sterile drape, hand hygiene and skin antiseptic. After time-out, moderate sedation was initiated. After the skin was infiltrated with lidocaine, the left posterior tibial vein was accessed under ultrasound guidance with a 21 gauge micropuncture set at the proximal calf level. Venography was performed. The micropuncture dilator was exchanged for a 8 [place] vascular sheath, through which a 5 French angled angiographic catheter was advanced for left lower extremity venography and inferior vena cavography. In similar fashion, on  the right,the posterior tibial vein was accessed under ultrasound guidance with a 21 gauge micropuncture set at the proximal calf level. Venography was performed. The micropuncture dilator was exchanged for a 8 [place] vascular sheath, through which a 5 French angled angiographic catheter was advanced for right lower extremity venography and advanced into the IVC. The angiographic catheters were exchanged for Rosen wires, both advanced up into the SVC. The AngioJetT ZelanteDVTT Thrombectomy  Catheter was advanced through the right sheath into the IVC, and activated for thrombectomy of the caval and iliac DVT. In similar fashion, the catheter was advanced through the left sheath into the IVC over the guidewire. Patient complained of chest pain, and cardiac arrhythmias were identified. Blood pressure and O2 sats remained stable. The catheter was activated for thrombectomy of the caval and iliac DVT during withdrawal of the catheter. Thrombectomy was terminated. No further venography was performed. Bilateral infusion catheters were placed, 135 cm on the left and 95 cm on the right, both with 50 cm infusion lengths, placed to treat both iliac venous systems in the IVC through the thrombosed segments. Catheters and sheaths were secured externally with 0 silk suture and Steri-Strips, and covered with a sterile dressing. Patient was transferred to the ICU and bilateral tPA infusion initiated per protocol. COMPLICATIONS: Chest pain and cardiac arrhythmia after partial mechanical thrombectomy. SIR level B: Nominal therapy (including overnight admission for observation), no consequence. FINDINGS: Limited bilateral lower extremity venography demonstrated incompletely occlusive DVT. Central venography demonstrated occlusive DVT in bilateral iliac veins. There is occlusive thrombus through the IVC from the iliac confluence to the level of renal vein inflow was, extending through the IVC filter. Partial mechanical thrombectomy was accomplished, abandon because of patient symptomatology. Plan is for overnight catheter directed thrombolysis with follow-up venography 01/08/2015. IMPRESSION: 1. Extensive occlusive acute thrombus through bilateral iliac venous systems and IVC, extending above the filter to the level of renal vein inflows. 2. Incompletely occlusive lower extremity DVT bilaterally. 3. Technically successful partial mechanical thrombolysis of iliocaval DVT as above. 4. Initiation of catheter directed tPA  infusion through bilateral lower extremity infusion catheters. Patient will return 12/22 for follow-up venography and intervention as indicated. Electronically Signed   By: Corlis Leak M.D.   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 The patient has received pharmacologic thrombolysis overnight via directed catheters. He returns for lysis check. EXAM: CONTINUATION OF THROMBOLYSIS WITH BILATERAL VENOGRAM LOWER EXTREMITIES AND CAVAGRAM FLUOROSCOPY TIME:  30 seconds MEDICATIONS AND MEDICAL HISTORY: None ANESTHESIA/SEDATION: 1.0 mg Versed, 50 mcg fentanyl CONTRAST:  OMNIPAQUE IOHEXOL 300 MG/ML  SOLN COMPLICATIONS: None PROCEDURE: Informed consent was obtained from the patient following explanation of the procedure, risks, benefits and alternatives. The patient understands, agrees and consents for the procedure. All questions were addressed. A time out was performed. The patient is position prone position on the IR table, and the bilateral popliteal region were prepped and draped in the usual sterile fashion including the indwelling catheters. Obturator wires were removed from the bilateral catheters, and pullback venogram was performed of the left and right lower extremity, including a cavagram. All sheaths wires and catheters were then removed. Sterile bandages were placed at the popliteal insertion site bilaterally. Patient tolerated the procedure well and remained hemodynamically stable throughout. No complications were encountered and no significant blood loss encountered. FINDINGS: Initial image demonstrates bilateral thrombolysis  catheters from the lower extremities unchanged in position above the level of the IVC filter. Venogram bilateral lower extremities and the cavagram demonstrates no residual thrombus within the bilateral iliac systems, with free flowing  contrast through the cava. Small volume of lucent filling defects associated with the filter, potentially nonocclusive thrombus or chronic synechiae. Bilateral femoral popliteal venous system patent. IMPRESSION: Status post thrombolysis completion, with pullback venogram of the bilateral iliofemoral venous system and the vena cava with resolution of ileal caval thrombus and free-flowing venous system confirm. Minimal volume lucent filling defects associated with the IVC filter (appears to be a Trapease filter), potentially nonocclusive thrombus or chronic synechiae. Signed, Yvone Neu. Loreta Ave DO [facility] PLAN: Agree with continued anti coagulation. Bilateral compression dressing at the popliteal insertion sites overnight. Follow up visit with [facility] in 2-4 weeks for discussion regarding possible treatment options regarding the filter. Electronically Signed   By: Gilmer Mor D.O.   On: 01/09/2015 08:19   Anti-infectives: Anti-infectives    None      Assessment/Plan: s/p * No surgery found * Continued stable. Continue heparin and Coumadin. INR is 1.0 today. Discussed that this will take him a more days to become therapeutic. Long discussion with the patient regarding standard treatment of DVT in his complication with hematuria and caval thrombus. Will need hematology evaluation prior to discharge home.   LOS: 4 days   Gretta Began 01/11/2015, 11:38 AM

## 2015-01-12 LAB — HEPARIN LEVEL (UNFRACTIONATED)
Heparin Unfractionated: 0.64 IU/mL (ref 0.30–0.70)
Heparin Unfractionated: 0.36 IU/mL (ref 0.30–0.70)

## 2015-01-12 LAB — PROTIME-INR
INR: 1.17 (ref 0.00–1.49)
PROTHROMBIN TIME: 15.1 s (ref 11.6–15.2)

## 2015-01-12 MED ORDER — WARFARIN SODIUM 10 MG PO TABS
10.0000 mg | ORAL_TABLET | Freq: Once | ORAL | Status: AC
  Administered 2015-01-12: 10 mg via ORAL
  Filled 2015-01-12: qty 1

## 2015-01-12 MED ORDER — HEPARIN (PORCINE) IN NACL 100-0.45 UNIT/ML-% IJ SOLN
1650.0000 [IU]/h | INTRAMUSCULAR | Status: DC
  Administered 2015-01-12 – 2015-01-13 (×2): 1650 [IU]/h via INTRAVENOUS
  Filled 2015-01-12 (×2): qty 250

## 2015-01-12 NOTE — Progress Notes (Addendum)
ANTICOAGULATION CONSULT NOTE - Follow Up Consult  Pharmacy Consult for heparin + warfarin Indication: acute thrombosis w/ EKOS   Labs:  Recent Labs  01/09/15 1015  01/10/15 0319 01/11/15 0403 01/11/15 1351 01/12/15 0533  HGB  --   --  13.9  --   --   --   HCT  --   --  40.5  --   --   --   PLT  --   --  71*  --   --   --   LABPROT  --   --  13.5 13.8  --  15.1  INR  --   --  1.01 1.04  --  1.17  HEPARINUNFRC  --   < > 0.32 0.24* 0.32 0.64  CREATININE 1.18  --  1.15  --   --   --   < > = values in this interval not displayed.   Dosing weight: 78.3 kg  Assessment: [id number] male continues on IV heparin and warfarin s/p EKOS- overlap day #3 of 5. Platelets are low but up to 71 as of 12/24 and have been stable the last few lab draws. Heparin level remains therapeutic today at 0.64 but has trended up significantly since rate increase on 12/25 to 1750 units/hr. INR remains SUBtherapeutic at 1.17 since initiation on 12/23. No new bleeding noted.    Goal of Therapy:  Heparin level 0.3-0.7 units/ml  INR 2-3   Plan:  - Decrease heparin gtt to 1650 units/hr in an attempt to maintain in therapeutic range - HL in 6 hours - Daily heparin level and CBC - Warfarin 10 mg PO x 1 tonight - Daily INR  Adlai Sinning K. Bonnye FavaNicolsen, PharmD, BCPS, CPP Clinical Pharmacist Pager: [phone] Phone: [phone] 01/12/2015 8:02 AM  FOLLOW-UP: - HL remains therapeutic at 0.36 - Continue heparin gtt at 1650 units/hr - Follow up am HL  Keyshon Stein K. Bonnye FavaNicolsen, PharmD, BCPS, CPP Clinical Pharmacist Pager: [phone] Phone: [phone] 01/12/2015 2:20 PM

## 2015-01-12 NOTE — Progress Notes (Signed)
Subjective: Interval History: none. Comfortable. No new chest pain..   Objective: Vital signs in last 24 hours: Temp:  [97.4 F (36.3 C)-98 F (36.7 C)] 98 F (36.7 C) (12/26 0446) Pulse Rate:  [49-57] 49 (12/26 0446) Resp:  [18] 18 (12/26 0446) BP: (129-137)/(78-87) 137/87 mmHg (12/26 0446) SpO2:  [94 %-97 %] 97 % (12/26 0446)  Intake/Output from previous day: 12/25 0701 - 12/26 0700 In: 960 [P.O.:960] Out: 1701 [Urine:1700; Stool:1] Intake/Output this shift: Total I/O In: 360 [P.O.:360] Out: 650 [Urine:650]  Palpable dorsalis pedis pulses bilaterally. No swelling in his lower extremities  Lab Results:  Recent Labs  01/10/15 0319  WBC 5.0  HGB 13.9  HCT 40.5  PLT 71*   BMET  Recent Labs  01/09/15 1015 01/10/15 0319  NA 142 142  K 3.9 3.8  CL 109 107  CO2 27 29  GLUCOSE 137* 103*  BUN 7 7  CREATININE 1.18 1.15  CALCIUM 8.2* 8.6*    Studies/Results: Ir Veno/ext/bi  02/05/15  CLINICAL DATA:  Bilateral lower extremity swelling and pain. 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 IVC filter in place. Doppler describes bilateral extensive lower extremity DVT. EXAM: IR INFUSION THROMBOL VENOUS INITIAL (MS); BILATERAL EXTREMITY VENOGRAPHY; THROMBOECTOMY MECHANICAL VENOUS; IR ULTRASOUND GUIDANCE VASC ACCESS LEFT; IR ULTRASOUND GUIDANCE VASC ACCESS RIGHT; INFERIOR VENA CAVOGRAM ANESTHESIA/SEDATION: Intravenous Fentanyl and Versed were administered as conscious sedation during continuous cardiorespiratory monitoring by the radiology RN, with a total moderate sedation time of 45 minutes. MEDICATIONS: Lidocaine 1% subcutaneous CONTRAST:  40mL OMNIPAQUE IOHEXOL 300 MG/ML  SOLN PROCEDURE: The procedure, risks (including but not limited to bleeding, infection, organ damage ), benefits, and alternatives were explained to the patient. Questions regarding the procedure were encouraged and answered. The patient understands and consents to the procedure. Patient placed prone on the  procedure table. Bilateral lower extremities prepped and draped in usual sterile fashion. Maximal barrier sterile technique was utilized including caps, mask, sterile gowns, sterile gloves, sterile drape, hand hygiene and skin antiseptic. After time-out, moderate sedation was initiated. After the skin was infiltrated with lidocaine, the left posterior tibial vein was accessed under ultrasound guidance with a 21 gauge micropuncture set at the proximal calf level. Venography was performed. The micropuncture dilator was exchanged for a 8 [place] vascular sheath, through which a 5 French angled angiographic catheter was advanced for left lower extremity venography and inferior vena cavography. In similar fashion, on the right,the posterior tibial vein was accessed under ultrasound guidance with a 21 gauge micropuncture set at the proximal calf level. Venography was performed. The micropuncture dilator was exchanged for a 8 [place] vascular sheath, through which a 5 French angled angiographic catheter was advanced for right lower extremity venography and advanced into the IVC. The angiographic catheters were exchanged for Rosen wires, both advanced up into the SVC. The AngioJetT ZelanteDVTT Thrombectomy Catheter was advanced through the right sheath into the IVC, and activated for thrombectomy of the caval and iliac DVT. In similar fashion, the catheter was advanced through the left sheath into the IVC over the guidewire. Patient complained of chest pain, and cardiac arrhythmias were identified. Blood pressure and O2 sats remained stable. The catheter was activated for thrombectomy of the caval and iliac DVT during withdrawal of the catheter. Thrombectomy was terminated. No further venography was performed. Bilateral infusion catheters were placed, 135 cm on the left and 95 cm on the right, both with 50 cm infusion lengths, placed to treat both iliac venous systems in the IVC through the  thrombosed segments. Catheters and  sheaths were secured externally with 0 silk suture and Steri-Strips, and covered with a sterile dressing. Patient was transferred to the ICU and bilateral tPA infusion initiated per protocol. COMPLICATIONS: Chest pain and cardiac arrhythmia after partial mechanical thrombectomy. SIR level B: Nominal therapy (including overnight admission for observation), no consequence. FINDINGS: Limited bilateral lower extremity venography demonstrated incompletely occlusive DVT. Central venography demonstrated occlusive DVT in bilateral iliac veins. There is occlusive thrombus through the IVC from the iliac confluence to the level of renal vein inflow was, extending through the IVC filter. Partial mechanical thrombectomy was accomplished, abandon because of patient symptomatology. Plan is for overnight catheter directed thrombolysis with follow-up venography 01/08/2015. IMPRESSION: 1. Extensive occlusive acute thrombus through bilateral iliac venous systems and IVC, extending above the filter to the level of renal vein inflows. 2. Incompletely occlusive lower extremity DVT bilaterally. 3. Technically successful partial mechanical thrombolysis of iliocaval DVT as above. 4. Initiation of catheter directed tPA infusion through bilateral lower extremity infusion catheters. Patient will return 12/22 for follow-up venography and intervention as indicated. Electronically Signed   By: Corlis Leak M.D.   On: 01/08/2015 08:54   Ir Caffie Damme Ivc  01/08/2015  CLINICAL DATA:  Bilateral lower extremity swelling and pain. 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 IVC filter in place. Doppler describes bilateral extensive lower extremity DVT. EXAM: IR INFUSION THROMBOL VENOUS INITIAL (MS); BILATERAL EXTREMITY VENOGRAPHY; THROMBOECTOMY MECHANICAL VENOUS; IR ULTRASOUND GUIDANCE VASC ACCESS LEFT; IR ULTRASOUND GUIDANCE VASC ACCESS RIGHT; INFERIOR VENA CAVOGRAM ANESTHESIA/SEDATION: Intravenous Fentanyl and Versed were administered as conscious  sedation during continuous cardiorespiratory monitoring by the radiology RN, with a total moderate sedation time of 45 minutes. MEDICATIONS: Lidocaine 1% subcutaneous CONTRAST:  40mL OMNIPAQUE IOHEXOL 300 MG/ML  SOLN PROCEDURE: The procedure, risks (including but not limited to bleeding, infection, organ damage ), benefits, and alternatives were explained to the patient. Questions regarding the procedure were encouraged and answered. The patient understands and consents to the procedure. Patient placed prone on the procedure table. Bilateral lower extremities prepped and draped in usual sterile fashion. Maximal barrier sterile technique was utilized including caps, mask, sterile gowns, sterile gloves, sterile drape, hand hygiene and skin antiseptic. After time-out, moderate sedation was initiated. After the skin was infiltrated with lidocaine, the left posterior tibial vein was accessed under ultrasound guidance with a 21 gauge micropuncture set at the proximal calf level. Venography was performed. The micropuncture dilator was exchanged for a 8 [place] vascular sheath, through which a 5 French angled angiographic catheter was advanced for left lower extremity venography and inferior vena cavography. In similar fashion, on the right,the posterior tibial vein was accessed under ultrasound guidance with a 21 gauge micropuncture set at the proximal calf level. Venography was performed. The micropuncture dilator was exchanged for a 8 [place] vascular sheath, through which a 5 French angled angiographic catheter was advanced for right lower extremity venography and advanced into the IVC. The angiographic catheters were exchanged for Rosen wires, both advanced up into the SVC. The AngioJetT ZelanteDVTT Thrombectomy Catheter was advanced through the right sheath into the IVC, and activated for thrombectomy of the caval and iliac DVT. In similar fashion, the catheter was advanced through the left sheath into the IVC over the  guidewire. Patient complained of chest pain, and cardiac arrhythmias were identified. Blood pressure and O2 sats remained stable. The catheter was activated for thrombectomy of the caval and iliac DVT during withdrawal of the catheter. Thrombectomy was terminated. No further venography was  performed. Bilateral infusion catheters were placed, 135 cm on the left and 95 cm on the right, both with 50 cm infusion lengths, placed to treat both iliac venous systems in the IVC through the thrombosed segments. Catheters and sheaths were secured externally with 0 silk suture and Steri-Strips, and covered with a sterile dressing. Patient was transferred to the ICU and bilateral tPA infusion initiated per protocol. COMPLICATIONS: Chest pain and cardiac arrhythmia after partial mechanical thrombectomy. SIR level B: Nominal therapy (including overnight admission for observation), no consequence. FINDINGS: Limited bilateral lower extremity venography demonstrated incompletely occlusive DVT. Central venography demonstrated occlusive DVT in bilateral iliac veins. There is occlusive thrombus through the IVC from the iliac confluence to the level of renal vein inflow was, extending through the IVC filter. Partial mechanical thrombectomy was accomplished, abandon because of patient symptomatology. Plan is for overnight catheter directed thrombolysis with follow-up venography 01/08/2015. IMPRESSION: 1. Extensive occlusive acute thrombus through bilateral iliac venous systems and IVC, extending above the filter to the level of renal vein inflows. 2. Incompletely occlusive lower extremity DVT bilaterally. 3. Technically successful partial mechanical thrombolysis of iliocaval DVT as above. 4. Initiation of catheter directed tPA infusion through bilateral lower extremity infusion catheters. Patient will return 12/22 for follow-up venography and intervention as indicated. Electronically Signed   By: Corlis Leak M.D.   On: 01/08/2015 08:54    Ir Thrombect Veno Mech Mod Sed  01/08/2015  CLINICAL DATA:  Bilateral lower extremity swelling and pain. 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 IVC filter in place. Doppler describes bilateral extensive lower extremity DVT. EXAM: IR INFUSION THROMBOL VENOUS INITIAL (MS); BILATERAL EXTREMITY VENOGRAPHY; THROMBOECTOMY MECHANICAL VENOUS; IR ULTRASOUND GUIDANCE VASC ACCESS LEFT; IR ULTRASOUND GUIDANCE VASC ACCESS RIGHT; INFERIOR VENA CAVOGRAM ANESTHESIA/SEDATION: Intravenous Fentanyl and Versed were administered as conscious sedation during continuous cardiorespiratory monitoring by the radiology RN, with a total moderate sedation time of 45 minutes. MEDICATIONS: Lidocaine 1% subcutaneous CONTRAST:  40mL OMNIPAQUE IOHEXOL 300 MG/ML  SOLN PROCEDURE: The procedure, risks (including but not limited to bleeding, infection, organ damage ), benefits, and alternatives were explained to the patient. Questions regarding the procedure were encouraged and answered. The patient understands and consents to the procedure. Patient placed prone on the procedure table. Bilateral lower extremities prepped and draped in usual sterile fashion. Maximal barrier sterile technique was utilized including caps, mask, sterile gowns, sterile gloves, sterile drape, hand hygiene and skin antiseptic. After time-out, moderate sedation was initiated. After the skin was infiltrated with lidocaine, the left posterior tibial vein was accessed under ultrasound guidance with a 21 gauge micropuncture set at the proximal calf level. Venography was performed. The micropuncture dilator was exchanged for a 8 [place] vascular sheath, through which a 5 French angled angiographic catheter was advanced for left lower extremity venography and inferior vena cavography. In similar fashion, on the right,the posterior tibial vein was accessed under ultrasound guidance with a 21 gauge micropuncture set at the proximal calf level. Venography was performed. The  micropuncture dilator was exchanged for a 8 [place] vascular sheath, through which a 5 French angled angiographic catheter was advanced for right lower extremity venography and advanced into the IVC. The angiographic catheters were exchanged for Rosen wires, both advanced up into the SVC. The AngioJetT ZelanteDVTT Thrombectomy Catheter was advanced through the right sheath into the IVC, and activated for thrombectomy of the caval and iliac DVT. In similar fashion, the catheter was advanced through the left sheath into the IVC over the guidewire. Patient complained of chest  pain, and cardiac arrhythmias were identified. Blood pressure and O2 sats remained stable. The catheter was activated for thrombectomy of the caval and iliac DVT during withdrawal of the catheter. Thrombectomy was terminated. No further venography was performed. Bilateral infusion catheters were placed, 135 cm on the left and 95 cm on the right, both with 50 cm infusion lengths, placed to treat both iliac venous systems in the IVC through the thrombosed segments. Catheters and sheaths were secured externally with 0 silk suture and Steri-Strips, and covered with a sterile dressing. Patient was transferred to the ICU and bilateral tPA infusion initiated per protocol. COMPLICATIONS: Chest pain and cardiac arrhythmia after partial mechanical thrombectomy. SIR level B: Nominal therapy (including overnight admission for observation), no consequence. FINDINGS: Limited bilateral lower extremity venography demonstrated incompletely occlusive DVT. Central venography demonstrated occlusive DVT in bilateral iliac veins. There is occlusive thrombus through the IVC from the iliac confluence to the level of renal vein inflow was, extending through the IVC filter. Partial mechanical thrombectomy was accomplished, abandon because of patient symptomatology. Plan is for overnight catheter directed thrombolysis with follow-up venography 01/08/2015. IMPRESSION: 1.  Extensive occlusive acute thrombus through bilateral iliac venous systems and IVC, extending above the filter to the level of renal vein inflows. 2. Incompletely occlusive lower extremity DVT bilaterally. 3. Technically successful partial mechanical thrombolysis of iliocaval DVT as above. 4. Initiation of catheter directed tPA infusion through bilateral lower extremity infusion catheters. Patient will return 12/22 for follow-up venography and intervention as indicated. Electronically Signed   By: Corlis Leak M.D.   On: 01/08/2015 08:54   Ir US Guide Vasc Access Left  01/08/2015  CLINICAL DATA:  Bilateral lower extremity swelling and pain. 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 IVC filter in place. Doppler describes bilateral extensive lower extremity DVT. EXAM: IR INFUSION THROMBOL VENOUS INITIAL (MS); BILATERAL EXTREMITY VENOGRAPHY; THROMBOECTOMY MECHANICAL VENOUS; IR ULTRASOUND GUIDANCE VASC ACCESS LEFT; IR ULTRASOUND GUIDANCE VASC ACCESS RIGHT; INFERIOR VENA CAVOGRAM ANESTHESIA/SEDATION: Intravenous Fentanyl and Versed were administered as conscious sedation during continuous cardiorespiratory monitoring by the radiology RN, with a total moderate sedation time of 45 minutes. MEDICATIONS: Lidocaine 1% subcutaneous CONTRAST:  40mL OMNIPAQUE IOHEXOL 300 MG/ML  SOLN PROCEDURE: The procedure, risks (including but not limited to bleeding, infection, organ damage ), benefits, and alternatives were explained to the patient. Questions regarding the procedure were encouraged and answered. The patient understands and consents to the procedure. Patient placed prone on the procedure table. Bilateral lower extremities prepped and draped in usual sterile fashion. Maximal barrier sterile technique was utilized including caps, mask, sterile gowns, sterile gloves, sterile drape, hand hygiene and skin antiseptic. After time-out, moderate sedation was initiated. After the skin was infiltrated with lidocaine, the left posterior  tibial vein was accessed under ultrasound guidance with a 21 gauge micropuncture set at the proximal calf level. Venography was performed. The micropuncture dilator was exchanged for a 8 [place] vascular sheath, through which a 5 French angled angiographic catheter was advanced for left lower extremity venography and inferior vena cavography. In similar fashion, on the right,the posterior tibial vein was accessed under ultrasound guidance with a 21 gauge micropuncture set at the proximal calf level. Venography was performed. The micropuncture dilator was exchanged for a 8 [place] vascular sheath, through which a 5 French angled angiographic catheter was advanced for right lower extremity venography and advanced into the IVC. The angiographic catheters were exchanged for Rosen wires, both advanced up into the SVC. The AngioJetT ZelanteDVTT Thrombectomy Catheter was advanced through  the right sheath into the IVC, and activated for thrombectomy of the caval and iliac DVT. In similar fashion, the catheter was advanced through the left sheath into the IVC over the guidewire. Patient complained of chest pain, and cardiac arrhythmias were identified. Blood pressure and O2 sats remained stable. The catheter was activated for thrombectomy of the caval and iliac DVT during withdrawal of the catheter. Thrombectomy was terminated. No further venography was performed. Bilateral infusion catheters were placed, 135 cm on the left and 95 cm on the right, both with 50 cm infusion lengths, placed to treat both iliac venous systems in the IVC through the thrombosed segments. Catheters and sheaths were secured externally with 0 silk suture and Steri-Strips, and covered with a sterile dressing. Patient was transferred to the ICU and bilateral tPA infusion initiated per protocol. COMPLICATIONS: Chest pain and cardiac arrhythmia after partial mechanical thrombectomy. SIR level B: Nominal therapy (including overnight admission for  observation), no consequence. FINDINGS: Limited bilateral lower extremity venography demonstrated incompletely occlusive DVT. Central venography demonstrated occlusive DVT in bilateral iliac veins. There is occlusive thrombus through the IVC from the iliac confluence to the level of renal vein inflow was, extending through the IVC filter. Partial mechanical thrombectomy was accomplished, abandon because of patient symptomatology. Plan is for overnight catheter directed thrombolysis with follow-up venography 01/08/2015. IMPRESSION: 1. Extensive occlusive acute thrombus through bilateral iliac venous systems and IVC, extending above the filter to the level of renal vein inflows. 2. Incompletely occlusive lower extremity DVT bilaterally. 3. Technically successful partial mechanical thrombolysis of iliocaval DVT as above. 4. Initiation of catheter directed tPA infusion through bilateral lower extremity infusion catheters. Patient will return 12/22 for follow-up venography and intervention as indicated. Electronically Signed   By: Corlis Leak M.D.   On: 01/08/2015 08:54   Ir US Guide Vasc Access Right  01/08/2015  CLINICAL DATA:  Bilateral lower extremity swelling and pain. 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 IVC filter in place. Doppler describes bilateral extensive lower extremity DVT. EXAM: IR INFUSION THROMBOL VENOUS INITIAL (MS); BILATERAL EXTREMITY VENOGRAPHY; THROMBOECTOMY MECHANICAL VENOUS; IR ULTRASOUND GUIDANCE VASC ACCESS LEFT; IR ULTRASOUND GUIDANCE VASC ACCESS RIGHT; INFERIOR VENA CAVOGRAM ANESTHESIA/SEDATION: Intravenous Fentanyl and Versed were administered as conscious sedation during continuous cardiorespiratory monitoring by the radiology RN, with a total moderate sedation time of 45 minutes. MEDICATIONS: Lidocaine 1% subcutaneous CONTRAST:  40mL OMNIPAQUE IOHEXOL 300 MG/ML  SOLN PROCEDURE: The procedure, risks (including but not limited to bleeding, infection, organ damage ), benefits, and  alternatives were explained to the patient. Questions regarding the procedure were encouraged and answered. The patient understands and consents to the procedure. Patient placed prone on the procedure table. Bilateral lower extremities prepped and draped in usual sterile fashion. Maximal barrier sterile technique was utilized including caps, mask, sterile gowns, sterile gloves, sterile drape, hand hygiene and skin antiseptic. After time-out, moderate sedation was initiated. After the skin was infiltrated with lidocaine, the left posterior tibial vein was accessed under ultrasound guidance with a 21 gauge micropuncture set at the proximal calf level. Venography was performed. The micropuncture dilator was exchanged for a 8 [place] vascular sheath, through which a 5 French angled angiographic catheter was advanced for left lower extremity venography and inferior vena cavography. In similar fashion, on the right,the posterior tibial vein was accessed under ultrasound guidance with a 21 gauge micropuncture set at the proximal calf level. Venography was performed. The micropuncture dilator was exchanged for a 8 [place] vascular sheath, through which a 5 [place]  angled angiographic catheter was advanced for right lower extremity venography and advanced into the IVC. The angiographic catheters were exchanged for Rosen wires, both advanced up into the SVC. The AngioJetT ZelanteDVTT Thrombectomy Catheter was advanced through the right sheath into the IVC, and activated for thrombectomy of the caval and iliac DVT. In similar fashion, the catheter was advanced through the left sheath into the IVC over the guidewire. Patient complained of chest pain, and cardiac arrhythmias were identified. Blood pressure and O2 sats remained stable. The catheter was activated for thrombectomy of the caval and iliac DVT during withdrawal of the catheter. Thrombectomy was terminated. No further venography was performed. Bilateral infusion catheters  were placed, 135 cm on the left and 95 cm on the right, both with 50 cm infusion lengths, placed to treat both iliac venous systems in the IVC through the thrombosed segments. Catheters and sheaths were secured externally with 0 silk suture and Steri-Strips, and covered with a sterile dressing. Patient was transferred to the ICU and bilateral tPA infusion initiated per protocol. COMPLICATIONS: Chest pain and cardiac arrhythmia after partial mechanical thrombectomy. SIR level B: Nominal therapy (including overnight admission for observation), no consequence. FINDINGS: Limited bilateral lower extremity venography demonstrated incompletely occlusive DVT. Central venography demonstrated occlusive DVT in bilateral iliac veins. There is occlusive thrombus through the IVC from the iliac confluence to the level of renal vein inflow was, extending through the IVC filter. Partial mechanical thrombectomy was accomplished, abandon because of patient symptomatology. Plan is for overnight catheter directed thrombolysis with follow-up venography 01/08/2015. IMPRESSION: 1. Extensive occlusive acute thrombus through bilateral iliac venous systems and IVC, extending above the filter to the level of renal vein inflows. 2. Incompletely occlusive lower extremity DVT bilaterally. 3. Technically successful partial mechanical thrombolysis of iliocaval DVT as above. 4. Initiation of catheter directed tPA infusion through bilateral lower extremity infusion catheters. Patient will return 12/22 for follow-up venography and intervention as indicated. Electronically Signed   By: Corlis Leak M.D.   On: 01/08/2015 08:54   Ir Infusion Thrombol Venous Initial (ms)  01/08/2015  CLINICAL DATA:  Bilateral lower extremity swelling and pain. 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 IVC filter in place. Doppler describes bilateral extensive lower extremity DVT. EXAM: IR INFUSION THROMBOL VENOUS INITIAL (MS); BILATERAL EXTREMITY VENOGRAPHY; THROMBOECTOMY  MECHANICAL VENOUS; IR ULTRASOUND GUIDANCE VASC ACCESS LEFT; IR ULTRASOUND GUIDANCE VASC ACCESS RIGHT; INFERIOR VENA CAVOGRAM ANESTHESIA/SEDATION: Intravenous Fentanyl and Versed were administered as conscious sedation during continuous cardiorespiratory monitoring by the radiology RN, with a total moderate sedation time of 45 minutes. MEDICATIONS: Lidocaine 1% subcutaneous CONTRAST:  40mL OMNIPAQUE IOHEXOL 300 MG/ML  SOLN PROCEDURE: The procedure, risks (including but not limited to bleeding, infection, organ damage ), benefits, and alternatives were explained to the patient. Questions regarding the procedure were encouraged and answered. The patient understands and consents to the procedure. Patient placed prone on the procedure table. Bilateral lower extremities prepped and draped in usual sterile fashion. Maximal barrier sterile technique was utilized including caps, mask, sterile gowns, sterile gloves, sterile drape, hand hygiene and skin antiseptic. After time-out, moderate sedation was initiated. After the skin was infiltrated with lidocaine, the left posterior tibial vein was accessed under ultrasound guidance with a 21 gauge micropuncture set at the proximal calf level. Venography was performed. The micropuncture dilator was exchanged for a 8 [place] vascular sheath, through which a 5 French angled angiographic catheter was advanced for left lower extremity venography and inferior vena cavography. In similar fashion, on the right,the posterior  tibial vein was accessed under ultrasound guidance with a 21 gauge micropuncture set at the proximal calf level. Venography was performed. The micropuncture dilator was exchanged for a 8 [place] vascular sheath, through which a 5 French angled angiographic catheter was advanced for right lower extremity venography and advanced into the IVC. The angiographic catheters were exchanged for Rosen wires, both advanced up into the SVC. The AngioJetT ZelanteDVTT Thrombectomy  Catheter was advanced through the right sheath into the IVC, and activated for thrombectomy of the caval and iliac DVT. In similar fashion, the catheter was advanced through the left sheath into the IVC over the guidewire. Patient complained of chest pain, and cardiac arrhythmias were identified. Blood pressure and O2 sats remained stable. The catheter was activated for thrombectomy of the caval and iliac DVT during withdrawal of the catheter. Thrombectomy was terminated. No further venography was performed. Bilateral infusion catheters were placed, 135 cm on the left and 95 cm on the right, both with 50 cm infusion lengths, placed to treat both iliac venous systems in the IVC through the thrombosed segments. Catheters and sheaths were secured externally with 0 silk suture and Steri-Strips, and covered with a sterile dressing. Patient was transferred to the ICU and bilateral tPA infusion initiated per protocol. COMPLICATIONS: Chest pain and cardiac arrhythmia after partial mechanical thrombectomy. SIR level B: Nominal therapy (including overnight admission for observation), no consequence. FINDINGS: Limited bilateral lower extremity venography demonstrated incompletely occlusive DVT. Central venography demonstrated occlusive DVT in bilateral iliac veins. There is occlusive thrombus through the IVC from the iliac confluence to the level of renal vein inflow was, extending through the IVC filter. Partial mechanical thrombectomy was accomplished, abandon because of patient symptomatology. Plan is for overnight catheter directed thrombolysis with follow-up venography 01/08/2015. IMPRESSION: 1. Extensive occlusive acute thrombus through bilateral iliac venous systems and IVC, extending above the filter to the level of renal vein inflows. 2. Incompletely occlusive lower extremity DVT bilaterally. 3. Technically successful partial mechanical thrombolysis of iliocaval DVT as above. 4. Initiation of catheter directed tPA  infusion through bilateral lower extremity infusion catheters. Patient will return 12/22 for follow-up venography and intervention as indicated. Electronically Signed   By: Corlis Leak M.D.   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 The patient has received pharmacologic thrombolysis overnight via directed catheters. He returns for lysis check. EXAM: CONTINUATION OF THROMBOLYSIS WITH BILATERAL VENOGRAM LOWER EXTREMITIES AND CAVAGRAM FLUOROSCOPY TIME:  30 seconds MEDICATIONS AND MEDICAL HISTORY: None ANESTHESIA/SEDATION: 1.0 mg Versed, 50 mcg fentanyl CONTRAST:  OMNIPAQUE IOHEXOL 300 MG/ML  SOLN COMPLICATIONS: None PROCEDURE: Informed consent was obtained from the patient following explanation of the procedure, risks, benefits and alternatives. The patient understands, agrees and consents for the procedure. All questions were addressed. A time out was performed. The patient is position prone position on the IR table, and the bilateral popliteal region were prepped and draped in the usual sterile fashion including the indwelling catheters. Obturator wires were removed from the bilateral catheters, and pullback venogram was performed of the left and right lower extremity, including a cavagram. All sheaths wires and catheters were then removed. Sterile bandages were placed at the popliteal insertion site bilaterally. Patient tolerated the procedure well and remained hemodynamically stable throughout. No complications were encountered and no significant blood loss encountered. FINDINGS: Initial image demonstrates bilateral thrombolysis catheters from the  lower extremities unchanged in position above the level of the IVC filter. Venogram bilateral lower extremities and the cavagram demonstrates no residual thrombus within the bilateral iliac systems, with free flowing  contrast through the cava. Small volume of lucent filling defects associated with the filter, potentially nonocclusive thrombus or chronic synechiae. Bilateral femoral popliteal venous system patent. IMPRESSION: Status post thrombolysis completion, with pullback venogram of the bilateral iliofemoral venous system and the vena cava with resolution of ileal caval thrombus and free-flowing venous system confirm. Minimal volume lucent filling defects associated with the IVC filter (appears to be a Trapease filter), potentially nonocclusive thrombus or chronic synechiae. Signed, Yvone NeuJaime S. Loreta AveWagner, DO [facility] PLAN: Agree with continued anti coagulation. Bilateral compression dressing at the popliteal insertion sites overnight. Follow up visit with VIR in 2-4 weeks for discussion regarding possible treatment options regarding the filter. Electronically Signed   By: Gilmer MorJaime  Wagner D.O.   On: 01/09/2015 08:19   Anti-infectives: Anti-infectives    None      Assessment/Plan: s/p * No surgery found * Able overall. Continue heparin and conversion to Coumadin. INR today is 1.17. Have consult to Dr.Ennever for hematology evaluation regarding recurrent thrombosis. Plan for discharge after INR therapeutic on Coumadin.   LOS: 5 days   Gretta Beganarly, Bart Ashford 01/12/2015, 9:49 AM

## 2015-01-12 NOTE — Progress Notes (Signed)
UR Completed. Sue Mcalexander, RN, BSN.  [phone] 

## 2015-01-13 ENCOUNTER — Other Ambulatory Visit: Payer: Self-pay | Admitting: Hematology & Oncology

## 2015-01-13 ENCOUNTER — Encounter (HOSPITAL_COMMUNITY): Payer: Self-pay | Admitting: General Practice

## 2015-01-13 ENCOUNTER — Other Ambulatory Visit: Payer: Self-pay

## 2015-01-13 DIAGNOSIS — Z7901 Long term (current) use of anticoagulants: Secondary | ICD-10-CM

## 2015-01-13 DIAGNOSIS — D6859 Other primary thrombophilia: Secondary | ICD-10-CM

## 2015-01-13 DIAGNOSIS — I80203 Phlebitis and thrombophlebitis of unspecified deep vessels of lower extremities, bilateral: Secondary | ICD-10-CM

## 2015-01-13 LAB — CBC
HCT: 42.3 % (ref 39.0–52.0)
Hemoglobin: 14.9 g/dL (ref 13.0–17.0)
MCH: 36.3 pg — AB (ref 26.0–34.0)
MCHC: 35.2 g/dL (ref 30.0–36.0)
MCV: 103.2 fL — AB (ref 78.0–100.0)
PLATELETS: 154 10*3/uL (ref 150–400)
RBC: 4.1 MIL/uL — AB (ref 4.22–5.81)
RDW: 12.8 % (ref 11.5–15.5)
WBC: 4.9 10*3/uL (ref 4.0–10.5)

## 2015-01-13 LAB — PROTIME-INR
INR: 1.45 (ref 0.00–1.49)
PROTHROMBIN TIME: 17.7 s — AB (ref 11.6–15.2)

## 2015-01-13 LAB — HEPARIN LEVEL (UNFRACTIONATED): Heparin Unfractionated: 0.38 IU/mL (ref 0.30–0.70)

## 2015-01-13 MED ORDER — PANTOPRAZOLE SODIUM 40 MG PO TBEC: 40.0000 mg | DELAYED_RELEASE_TABLET | Freq: Every day | ORAL | Status: DC

## 2015-01-13 MED ORDER — FONDAPARINUX SODIUM 7.5 MG/0.6ML ~~LOC~~ SOLN
7.5000 mg | Freq: Every day | SUBCUTANEOUS | Status: DC
  Administered 2015-01-13: 7.5 mg via SUBCUTANEOUS
  Filled 2015-01-13 (×2): qty 0.6

## 2015-01-13 MED ORDER — FONDAPARINUX SODIUM 7.5 MG/0.6ML ~~LOC~~ SOLN: 7.5000 mg | SUBCUTANEOUS | Status: DC

## 2015-01-13 NOTE — Care Management Note (Addendum)
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  Pt will likley discharge home on Arixtra.  CM submitted benefit check   Expected Discharge Date:                  Expected Discharge Plan:  Home/Self Care  In-House Referral:     Discharge planning Services  CM Consult, Medication Assistance  Post Acute Care Choice:    Choice offered to:     DME Arranged:    DME Agency:     HH Arranged:    HH Agency:     Status of Service:  In process, will continue to follow  Medicare Important Message Given:    Date Medicare IM Given:    Medicare IM give by:    Date Additional Medicare IM Given:    Additional Medicare Important Message give by:     If discussed at Long Length of Stay Meetings, dates discussed:    Additional Comments: 13:55 CM contacted oncology MD Cindee LamePete and spoke with RN Lupita Leashonna, requested prescription to be faxed to pharmacy ASAP.  CM received return call; prescription has been faxed to pharmacy by oncology office with verification of receipt.  CM instructed [facility] [address] to verify they had the prescription; if not, pt was instructed to contact Dr Cindee LamePete with oncology pt stated he had the MD card in his wallet.  Arixtra Benefit Check:  S/W ANDREW @ [employer] [facility] # [phone] [id number]   ARIXTRA 7.5 MG DAILY FOR 30 DAY SUPPLY   COVER- YES  CO-PAY $ 100.00  PRIOR APPROVAL - NO  PHARMACY:  [facility]  [place] [facility]       1235: CM informed pt of copay cost; pt stated he could afford to pay the copay.  CM contacted multiple pharmacies in the [place] area and was told they would not be able to provide drug.  [facility] [place], CM contacted pharmacy; pharmacy does not  currently have inventory to fill script however; will be able to order today and have it delivered tomorrow am as long as script is faxed to pharmacy no later than 5pm today, MD made aware.  [facility] [address] in [place].  Pt has first scheduled dose for 1400 today. Cherylann ParrClaxton, Brooke Steinhilber S, RN 01/13/2015, 12:20 PM

## 2015-01-13 NOTE — Progress Notes (Addendum)
  [facility] Progress Note  Subjective  - No complaints   Objective Filed Vitals:   01/12/15 2055 01/13/15 0650  BP: 171/85 135/74  Pulse: 55 53  Temp: 97.6 F (36.4 C) 98.1 F (36.7 C)  Resp: 18 18    Intake/Output Summary (Last 24 hours) at 01/13/15 16100728 Last data filed at 01/12/15 1500  Gross per 24 hour  Intake    720 ml  Output   1451 ml  Net   -731 ml    2+ DP pulses b/l No swelling b/l lower extremities  Assessment/Planning: 49 y.o. male with phlegmasia cerulea dolens bilateral lower extremities s/p thrombolysis   Stable.  INR 1.45 today. Continue heparin to coumadin Hematology consult.  Mobilize. Home when INR >2.    Ranae PlumberKimberly A Trinh 01/13/2015 7:28 AM --  Laboratory CBC    Component Value Date/Time   WBC 4.9 01/13/2015 0458   HGB 14.9 01/13/2015 0458   HCT 42.3 01/13/2015 0458   PLT 154 01/13/2015 0458    BMET    Component Value Date/Time   NA 142 01/10/2015 0319   K 3.8 01/10/2015 0319   CL 107 01/10/2015 0319   CO2 29 01/10/2015 0319   GLUCOSE 103* 01/10/2015 0319   BUN 7 01/10/2015 0319   CREATININE 1.15 01/10/2015 0319   CALCIUM 8.6* 01/10/2015 0319   GFRNONAA >60 01/10/2015 0319   GFRAA >60 01/10/2015 0319    COAG Lab Results  Component Value Date   INR 1.45 01/13/2015   INR 1.17 01/12/2015   INR 1.04 01/11/2015   No results found for: PTT  Antibiotics Anti-infectives    None       Maris BergerKimberly Trinh, PA-C [facility] Office: [phone] Pager: [phone] 01/13/2015 7:28 AM    Addendum  I agree with the physician assistant's findings.  Home in the next 2-3 days once coumadin therapeutic at INR > 2  Leonides SakeBrian Chen, MD [facility] Office: [phone] Pager: [phone]  01/13/2015, 1:57 PM

## 2015-01-13 NOTE — Consult Note (Signed)
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 : I had another blood clot in my legss.  HPI: [name] is a very nice 49 year old white male. His history of thromboembolic disease dates back to me. He had a blood clot in his leg and lungs. He initially underwent thrombo-lytic therapy. He was then placed on ELIQUIS. He had bleeding from [facility]. He was evaluated for the bleeding but no source was ever found. Because of this, he had a filter placed.  He then began to have pain in his legs. The pain gets so severe that he had to go to the emergency room. He then began to have compromised circulation in his legs because of thromboembolic disease. He is found to have extensive thrombi all the way up to his filter.  He was admitted. Interventional radiology performed thrombolytic therapy on him. This worked very nicely. His legs improved markedly.  He is on heparin and is being converted over to Coumadin.  He has been evaluated by one of the hematologist down in [place]. I'm sure that he has had hypercoagulable studies done. There is no family history of blood clots. He does smoke a little bit. He's had no recent travel. He does not use testosterone supplements. He exercises. He is in pretty good shape.  He's had no problems with bowels or bladder.  He's not noted any rashes.  There is no risk factors for HIV or hepatitis.  He did have an echocardiogram done on December 22. This showed a good ejection fraction of 55-60%. He had a good wall motion. The cardiac valves looked okay.  He's had no fever. He is not a vegetarian. He's had no weight loss or weight gain.  Overall, his performance status is ECOG 1.             Past Medical History  Diagnosis Date  . Peripheral vascular disease (HCC)   . Bronchiolitis Feb. 2016  . Pneumonia Feb. 2016  . Anxiety   . Hyperlipidemia   . DVT (deep venous thrombosis) (HCC)   . PVD  (peripheral vascular disease) (HCC) 01/09/2015    Right iliac stent 2007  :  Past Surgical History  Procedure Laterality Date  . Thrombolysis Right Nov. 9, 2010    Lower Extrim.  . Hand surgery    . Rotator cuff repair    . Tonsillectomy    :   Current facility-administered medications:  .  0.9 %  sodium chloride infusion, , Intravenous, Continuous, Sherren Kerns, MD, Last Rate: 10 mL/hr at 01/09/15 1513, 10 mL/hr at 01/09/15 1513 .  acetaminophen (TYLENOL) tablet 325-650 mg, 325-650 mg, Oral, Q4H PRN, 650 mg at 01/11/15 2358 **OR** acetaminophen (TYLENOL) suppository 325-650 mg, 325-650 mg, Rectal, Q4H PRN, Samantha J Rhyne, PA-C .  alum & mag hydroxide-simeth (MAALOX/MYLANTA) 200-200-20 MG/5ML suspension 15-30 mL, 15-30 mL, Oral, Q2H PRN, Samantha J Rhyne, PA-C .  bisacodyl (DULCOLAX) suppository 10 mg, 10 mg, Rectal, Daily PRN, Samantha J Rhyne, PA-C .  guaiFENesin-dextromethorphan (ROBITUSSIN DM) 100-10 MG/5ML syrup 15 mL, 15 mL, Oral, Q4H PRN, Samantha J Rhyne, PA-C .  heparin ADULT infusion 100 units/mL (25000 units/250 mL), 1,650 Units/hr, Intravenous, Continuous, Elvin So, RPH, Last Rate: 16.5 mL/hr at 01/13/15 0559, 1,650 Units/hr at 01/13/15 0559 .  hydrALAZINE (APRESOLINE) injection 5 mg, 5 mg, Intravenous, Q20 Min PRN, Samantha J Rhyne, PA-C .  magnesium hydroxide (MILK OF MAGNESIA) suspension 30 mL, 30 mL, Oral, Daily PRN,  Samantha J Rhyne, PA-C, 30 mL at 01/10/15 1016 .  metoprolol (LOPRESSOR) injection 2-5 mg, 2-5 mg, Intravenous, Q2H PRN, Samantha J Rhyne, PA-C .  morphine 2 MG/ML injection 2-4 mg, 2-4 mg, Intravenous, Q1H PRN, Ames CoupeSamantha J Rhyne, PA-C, 2 mg at 01/12/15 1950 .  ondansetron (ZOFRAN) injection 4 mg, 4 mg, Intravenous, Q6H PRN, Samantha J Rhyne, PA-C .  oxyCODONE (Oxy IR/ROXICODONE) immediate release tablet 5-10 mg, 5-10 mg, Oral, Q4H PRN, Ames CoupeSamantha J Rhyne, PA-C, 10 mg at 01/12/15 2218 .  pantoprazole (PROTONIX) injection 40 mg, 40 mg, Intravenous,  Q24H, Samantha J Rhyne, PA-C, 40 mg at 01/12/15 1430 .  phenol (CHLORASEPTIC) mouth spray 1 spray, 1 spray, Mouth/Throat, PRN, Samantha J Rhyne, PA-C .  pneumococcal 23 valent vaccine (PNU-IMMUNE) injection 0.5 mL, 0.5 mL, Intramuscular, Tomorrow-1000, Sherren Kernsharles E Fields, MD, 0.5 mL at 01/11/15 1000 .  warfarin (COUMADIN) video, , Does not apply, Once, Rosaland LaoRachel L Rumbarger, RPH .  Warfarin - Pharmacist Dosing Inpatient, , Does not apply, q1800, Faye Ramsayachel L Rumbarger, RPH:  . pantoprazole (PROTONIX) IV  40 mg Intravenous Q24H  . pneumococcal 23 valent vaccine  0.5 mL Intramuscular Tomorrow-1000  . warfarin   Does not apply Once  . Warfarin - Pharmacist Dosing Inpatient   Does not apply q1800  :  No Known Allergies:  Family History  Problem Relation Age of Onset  . Deep vein thrombosis Father   . Heart disease Father [id number]    Before age 49  . Hyperlipidemia Father   . Heart attack Father   . Stroke Father   :  Social History   Social History  . Marital Status: Married    Spouse Name: N/A  . Number of Children: N/A  . Years of Education: N/A   Occupational History  . Not on file.   Social History Main Topics  . Smoking status: Light Tobacco Smoker  . Smokeless tobacco: Never Used  . Alcohol Use: 0.6 oz/week    1 Glasses of wine per week  . Drug Use: No  . Sexual Activity: Not on file   Other Topics Concern  . Not on file   Social History Narrative  :  Pertinent items are noted in HPI.  Exam: Patient Vitals for the past 24 hrs:  BP Temp Temp src Pulse Resp SpO2  01/13/15 0650 135/74 mmHg 98.1 F (36.7 C) Oral (!) 53 18 93 %  01/12/15 2055 (!) 171/85 mmHg 97.6 F (36.4 C) Oral (!) 55 18 98 %  01/12/15 1507 (!) 143/83 mmHg 98.6 F (37 C) Oral (!) 55 18 98 %    well-developed and well-nourished white male in no obvious distress. Head and neck exam shows no ocular or oral lesions. He has no palpable cervical or supraclavicular lymph nodes. Lungs are clear. Cardiac exam  regular rate and rhythm with no murmurs, rubs or bruits. Abdomen is soft. He has good bowel sounds. There is no fluid wave. There is no palpable liver or spleen tip. Back exam shows no tenderness over the spine, ribs or hips. Extremities shows no clubbing, cyanosis or edema. He has some slight nonpitting edema of his lower legs. No obvious venous cord is noted in his legs. Skin exam shows no rashes, ecchymosis or petechia. Neurological exam shows no focal neurological deficits.    Recent Labs  01/13/15 0458  WBC 4.9  HGB 14.9  HCT 42.3  PLT 154   No results for input(s): NA, K, CL, CO2, GLUCOSE, BUN, CREATININE, CALCIUM in  the last 72 hours.  Blood smear review:  None  Pathology: None     Assessment and Plan:  Ms. Chamberlin is a 49 year old white male. He clearly is hypercoagulable. The question is why he would have these issues. I'll have to try to get records from [place] to see STUDIES have been done.  It is probable that we will not find an actual thrombophilic condition. It certainly would not surprise me.  I believe that he needs lifelong anticoagulation.  I understand the reasoning behind the filter but I think the filter served as a focus for clot formation.  For now, I think Coumadin is very reasonable. It still has a place in therapy. However, Mr. Ende does not like the idea of having to check the INR weekly. That's a co-pay that he would much not have to pate.  I think a good option for him would be Arixtra. This is an injectable low molecular weight heparin product. The nice thing about Arixtra is that 1 dose basically covers most patients. The pharmacokinetics work the same in everybody.  Ultimately, we might want to try to get him back on oral anticoagulation with one of the other new specific anticoagulants. I think that this would really be ideal. Not sure why he had the bleeding with ELIQUIS but that does not mean he would bleed with one of the other agents as they  are of a different chemical structure.  He is very nice. He is quite intelligent. He just wants to try to have a normal life and not have to keep going to see doctors.  He actually works maybe 10 minutes from my office. I can easily follow him up as an outpatient.  I appreciate the opportunity of seeing him.  Pete E.

## 2015-01-13 NOTE — Progress Notes (Signed)
  [facility] Progress Note  Dr. Myna HidalgoEnnever with heme/onc recommending Arixtra in lieu of coumadin. Patient to be given first dose today. Patient was instructed on how to administer medication. Discharge home today. He will follow up with Dr. Myna HidalgoEnnever and Dr. Darrick PennaFields in the next 1-2 weeks.   Maris BergerKimberly Trinh, PA-C

## 2015-01-14 ENCOUNTER — Telehealth: Payer: Self-pay | Admitting: Vascular Surgery

## 2015-01-14 ENCOUNTER — Encounter: Payer: Self-pay | Admitting: *Deleted

## 2015-01-14 NOTE — Telephone Encounter (Addendum)
sched appt 01/29/15  Spoke to pt to inform him of appt.  ----- Message from Sharee PimpleMarilyn K McChesney, RN sent at 01/13/2015  5:14 PM EST ----- Regarding: schedule   ----- Message -----    From: Raymond GurneyKimberly A Trinh, PA-C    Sent: 01/13/2015   2:32 PM      To: [facility] Charge Pool  Phlegmasia bilateral  F/u with Dr. Darrick PennaFields in 2 weeks  Thanks Selena BattenKim

## 2015-01-15 ENCOUNTER — Other Ambulatory Visit: Payer: Self-pay

## 2015-01-15 ENCOUNTER — Telehealth: Payer: Self-pay

## 2015-01-15 MED ORDER — SULFAMETHOXAZOLE-TRIMETHOPRIM 800-160 MG PO TABS
1.0000 | ORAL_TABLET | Freq: Two times a day (BID) | ORAL | Status: DC
Start: 2015-01-15 — End: 2015-03-20

## 2015-01-15 NOTE — Telephone Encounter (Signed)
Contacted pt to ensure he received Arixtra. Pt confirms that he has.   Pt reports difficulty urinating with the feeling of retention. States he noted bright red blood in urine. Denies malodorous urine. Had indwelling cath in hospital. Per Dr Myna HidalgoEnnever, antibiotic called in. Pt aware to notify office should bleeding continue. dph

## 2015-01-15 NOTE — Telephone Encounter (Signed)
Received call from pt reporting he has had gross hematuria all day that seems to be worsening. Per Dr Myna HidalgoEnnever hold Arixtra until he is seen in the office tomorrow.   Pt reports he did see a urologist this summer and "they ran the light up there." Patient does not know urologist's name but thinks [facility] was [facility]. Message left on VM at this clinic in an attempt to get records. dph

## 2015-01-16 ENCOUNTER — Ambulatory Visit (HOSPITAL_BASED_OUTPATIENT_CLINIC_OR_DEPARTMENT_OTHER): Payer: BC Managed Care – PPO | Admitting: Nurse Practitioner

## 2015-01-16 ENCOUNTER — Ambulatory Visit (HOSPITAL_BASED_OUTPATIENT_CLINIC_OR_DEPARTMENT_OTHER): Payer: BC Managed Care – PPO | Admitting: Family

## 2015-01-16 ENCOUNTER — Other Ambulatory Visit: Payer: Self-pay | Admitting: Family

## 2015-01-16 DIAGNOSIS — I82409 Acute embolism and thrombosis of unspecified deep veins of unspecified lower extremity: Secondary | ICD-10-CM

## 2015-01-16 DIAGNOSIS — R339 Retention of urine, unspecified: Secondary | ICD-10-CM

## 2015-01-16 LAB — CBC WITH DIFFERENTIAL (CANCER CENTER ONLY)
BASO#: 0 10*3/uL (ref 0.0–0.2)
BASO%: 0.2 % (ref 0.0–2.0)
EOS%: 2.8 % (ref 0.0–7.0)
Eosinophils Absolute: 0.2 10*3/uL (ref 0.0–0.5)
HEMATOCRIT: 45 % (ref 38.7–49.9)
HEMOGLOBIN: 15.7 g/dL (ref 13.0–17.1)
LYMPH#: 1.4 10*3/uL (ref 0.9–3.3)
LYMPH%: 23 % (ref 14.0–48.0)
MCH: 36 pg — ABNORMAL HIGH (ref 28.0–33.4)
MCHC: 34.9 g/dL (ref 32.0–35.9)
MCV: 103 fL — ABNORMAL HIGH (ref 82–98)
MONO#: 0.8 10*3/uL (ref 0.1–0.9)
MONO%: 12.9 % (ref 0.0–13.0)
NEUT%: 61.1 % (ref 40.0–80.0)
NEUTROS ABS: 3.8 10*3/uL (ref 1.5–6.5)
Platelets: 263 10*3/uL (ref 145–400)
RBC: 4.36 10*6/uL (ref 4.20–5.70)
RDW: 12.5 % (ref 11.1–15.7)
WBC: 6.1 10*3/uL (ref 4.0–10.0)

## 2015-01-16 LAB — COMPREHENSIVE METABOLIC PANEL
ALK PHOS: 81 U/L (ref 40–150)
ALT: 61 U/L — ABNORMAL HIGH (ref 0–55)
AST: 33 U/L (ref 5–34)
Albumin: 4.2 g/dL (ref 3.5–5.0)
Anion Gap: 10 mEq/L (ref 3–11)
BUN: 17.6 mg/dL (ref 7.0–26.0)
CHLORIDE: 106 meq/L (ref 98–109)
CO2: 27 mEq/L (ref 22–29)
Calcium: 9.3 mg/dL (ref 8.4–10.4)
Creatinine: 1.3 mg/dL (ref 0.7–1.3)
EGFR: 66 mL/min/{1.73_m2} — AB (ref >90)
GLUCOSE: 110 mg/dL (ref 70–140)
POTASSIUM: 4 meq/L (ref 3.5–5.1)
SODIUM: 143 meq/L (ref 136–145)
Total Bilirubin: 0.97 mg/dL (ref 0.20–1.20)
Total Protein: 7 g/dL (ref 6.4–8.3)

## 2015-01-16 LAB — APTT: APTT: 33 s (ref 24–37)

## 2015-01-16 LAB — PROTIME-INR (CHCC SATELLITE)
INR: 1.5 — AB (ref 2.0–3.5)
PROTIME: 18 s — AB (ref 10.6–13.4)

## 2015-01-16 MED ORDER — TAMSULOSIN HCL 0.4 MG PO CAPS
0.4000 mg | ORAL_CAPSULE | Freq: Every day | ORAL | Status: DC
Start: 2015-01-16 — End: 2015-03-20

## 2015-01-16 NOTE — Progress Notes (Signed)
Hematology/Oncology Consultation   Name: Aaron Cunningham      MRN: [id number]    Location: Room/bed info not found  Date: 01/16/2015 Time:1:46 PM   REFERRING PHYSICIAN: Seen in the Hospital by Dr. Myna Hidalgo on 12/27  REASON FOR CONSULT: Hospital follow-up   DIAGNOSIS:  1. 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 History of DVT right lower extremity and PE  TREATMENT:  1. S/p Thrombolysis through bilateral lower extremity infusion catheters 2. Arixtra 7.5 mg SQ daily *Lifelong anticoagulation*  HISTORY OF PRESENT ILLNESS: Aaron Cunningham is a very pleasant 49 yo white male with a history of DVT in the right lower extremity and PE diagnosed in early summer this year. He was treated with Eliquis but developed a bleed. The source of the bleed was not found. He then had an IVC filter placed.  On 12/21 He went to [facility] ED with back pain and n/v. Her was found to have a 5x4 mm calculus in the the distal right ureter with surrounding inflammation. He states that they discussed his needed a lithotripsy and that he may not be able to pass the stone. This was not addressed during his stay as he developed leg swelling and was found to have extensive bilateral thrombus of the ileac veins and IVC extending above the filter to the level of the renal vein inflows.  He then had catheter directed tPA infusion through bilateral lower extremity infusion catheters on 12/22.   12/23 thrombolysis eval showed no residual thrombus within the bilateral iliac systems.  He is now on Arixtra and t He swelling and pain in his legs has improved. He is having mild blood in his urine. His Hgb is stable at 15.7 and an MCV of 101.  He wears compression stockings daily. No numbness or tingling in his extremities at this time.  He has seen urology in the past and was on Flomax at one time. He has not followed up with urology and would like a referral  to a urologist in [place].  He is still having some right flank pain at times.  Unfortunately, his wife passed away in 2022-10-23 from cancer. He is still has two children at home and one in college.  No family history of blood clots. He currently works in [employer] and does a lot of walking with his job.  He smokes 6 cigarettes a day and is actively weaning himself off completely. He has an occasional beer in the evenings.  He has had no fever, chills, n/v, cough, rash, dizziness, SOB, chest pain, palpitations, abdominal pain or changes in bowel habits.  He has a healthy appetite and is staying well hydrated. He denies any significant weight loss or gain.   ROS: All other 10 point review of systems is negative.   PAST MEDICAL HISTORY:   Past Medical History  Diagnosis Date  . Peripheral vascular disease (HCC)   . Bronchiolitis Feb. 2016  . Pneumonia Feb. 2016  . Anxiety   . Hyperlipidemia   . DVT (deep venous thrombosis) (HCC)   . PVD (peripheral vascular disease) (HCC) 01/09/2015    Right iliac stent 2007  . GERD (gastroesophageal reflux disease)     ALLERGIES: No Known Allergies    MEDICATIONS:  Current Outpatient Prescriptions on File Prior to Visit  Medication Sig Dispense Refill  . fondaparinux (ARIXTRA) 7.5 MG/0.6ML SOLN injection Inject 0.6 mLs (7.5 mg total) into the skin  daily. 18 mL 0  . omeprazole (PRILOSEC) 20 MG capsule Take 20 mg by mouth daily.    Marland Kitchen. sulfamethoxazole-trimethoprim (BACTRIM DS,SEPTRA DS) 800-160 MG tablet Take 1 tablet by mouth 2 (two) times daily. 6 tablet 0  . VENTOLIN HFA 108 (90 BASE) MCG/ACT inhaler as needed.  0   No current facility-administered medications on file prior to visit.     PAST SURGICAL HISTORY Past Surgical History  Procedure Laterality Date  . Thrombolysis Right Nov. 9, 2010    Lower Extrim.  . Hand surgery    . Rotator cuff repair    . Tonsillectomy    . Ivc filter placement ([facility] hx)  2016     FAMILY  HISTORY: Family History  Problem Relation Age of Onset  . Deep vein thrombosis Father   . Heart disease Father [id number]    Before age 49  . Hyperlipidemia Father   . Heart attack Father   . Stroke Father     SOCIAL HISTORY:  reports that he quit smoking 9 days ago. His smoking use included Cigarettes. He has a 30 pack-year smoking history. He has never used smokeless tobacco. He reports that he drinks about 0.6 oz of alcohol per week. He reports that he does not use illicit drugs.  PERFORMANCE STATUS: The patient's performance status is 1 - Symptomatic but completely ambulatory  PHYSICAL EXAM: Most Recent Vital Signs: There were no vitals taken for this visit. There were no vitals taken for this visit.  General Appearance:    Alert, cooperative, no distress, appears stated age  Head:    Normocephalic, without obvious abnormality, atraumatic  Eyes:    PERRL, conjunctiva/corneas clear, EOM's intact, fundi    benign, both eyes             Throat:   Lips, mucosa, and tongue normal; teeth and gums normal  Neck:   Supple, symmetrical, trachea midline, no adenopathy;       thyroid:  No enlargement/tenderness/nodules; no carotid   bruit or JVD  Back:     Symmetric, no curvature, ROM normal, no CVA tenderness  Lungs:     Clear to auscultation bilaterally, respirations unlabored  Chest wall:    No tenderness or deformity  Heart:    Regular rate and rhythm, S1 and S2 normal, no murmur, rub   or gallop  Abdomen:     Soft, non-tender, bowel sounds active all four quadrants,    no masses, no organomegaly        Extremities:   Extremities normal, atraumatic, no cyanosis or edema  Pulses:   2+ and symmetric all extremities  Skin:   Skin color, texture, turgor normal, no rashes or lesions  Lymph nodes:   Cervical, supraclavicular, and axillary nodes normal  Neurologic:   CNII-XII intact. Normal strength, sensation and reflexes      throughout   LABORATORY DATA:  Results for orders placed or  performed in visit on 01/16/15 (from the past 48 hour(s))  CBC w/Diff     Status: Abnormal   Collection Time: 01/16/15  1:26 PM  Result Value Ref Range   WBC 6.1 4.0 - 10.0 10e3/uL   RBC 4.36 4.20 - 5.70 10e6/uL   HGB 15.7 13.0 - 17.1 g/dL   HCT 16.145.0 09.638.7 - 04.549.9 %   MCV 103 (H) 82 - 98 fL   MCH 36.0 (H) 28.0 - 33.4 pg   MCHC 34.9 32.0 - 35.9 g/dL   RDW 40.912.5 81.111.1 - 91.415.7 %  Platelets 263 145 - 400 10e3/uL   NEUT# 3.8 1.5 - 6.5 10e3/uL   LYMPH# 1.4 0.9 - 3.3 10e3/uL   MONO# 0.8 0.1 - 0.9 10e3/uL   Eosinophils Absolute 0.2 0.0 - 0.5 10e3/uL   BASO# 0.0 0.0 - 0.2 10e3/uL   NEUT% 61.1 40.0 - 80.0 %   LYMPH% 23.0 14.0 - 48.0 %   MONO% 12.9 0.0 - 13.0 %   EOS% 2.8 0.0 - 7.0 %   BASO% 0.2 0.0 - 2.0 %  PROTIME-INR ([facility])     Status: Abnormal   Collection Time: 01/16/15  1:26 PM  Result Value Ref Range   Protime 18.0 (H) 10.6 - 13.4 Seconds   INR 1.5 (L) 2.0 - 3.5    Comment: INR is useful only to assess adequacy of anticoagulation with coumadin when comparing results from different labs. It should not be used to estimate bleeding risk or presence/abscense of coagulopathy in patients not on coumadin. Expected INR ranges for  nontherapeutic patients is 0.88 - 1.12.    Lovenox No       RADIOGRAPHY: No results found.     PATHOLOGY: None  ASSESSMENT/PLAN: Mr. Cartmell is a very pleasant 49 yo white male with a history of DVT in the right lower extremity and PE diagnosed in early summer this year. He was treated with Eliquis but developed a bleed. Source of the bleed was not found. He then had an IVC filter placed.  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 He is on Arixtra and has had some blood in his urine. His Hgb is stable at 15.7 and an MCV of 103. We will continue him on his same dose of Arixtra.  We will refer him to Dr. Retta Diones with Urology and go ahead and restart him on Flomax.  We will plan to see him back in 3 weeks for follow-up and labs.  We will  plan to repeat doppler studies of his legs in 2-3 months.  All questions were answered. He will contact us with any questions or concerns. We can certainly see him much sooner if necessary.  He was discussed with and also seen by Dr. Myna Hidalgo and he is in agreement with the aforementioned.   [facility]     Addendum:  I saw and examined patient with Sarah. I saw him in the hospital. He will need lifelong anticoagulation. The question is whether not wheezing get him on one of the oral medications in the future.  For now, he will stay on Arixtra. This seems to be helping him.  He has had a tough go of it lately. His wife passed away.  We will plan to get him back in about 6-8 weeks. We probably will get a Doppler on him at that time.  Hewitt Shorts

## 2015-01-20 NOTE — Discharge Summary (Signed)
[facility] Discharge Summary  Doyel Mulkern Mcelroy 01/08/1966 50 y.o. 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 male who presented for evaluation of sudden onset cool bluish colored legs around 11 am today. Pt has a sense of fullness in both thighs. Over that last 2 hours has developed some numbness and tingling in toes but no pain. 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 He has been on Eliquis but stopped this several days ago due to hematuria. He had been on a regimen where he was on Eliquis for 5 days followed by 2 off days due to this hematuria which has been present for months. He apparently had a full urologic eval which showed no cause for hematuria. The patient was seen by his primary MD yesterday for back pain and a right ureteral stone without hydro was seen on CT. Pt also has a remote history of right iliac stent in 2007. His ABIs 9 months ago were normal. He denies claudication symptoms. Other medical problems include hyperlipidemia and anxiety which have been stable.  Hospital Course:  The patient was admitted to the hospital on 01/07/2015 with the diagnosis of occlusion of IVC filter with early phlegmasia cerulea dolens. Arterial etiology was unlikely since he had palpable femoral pulses and a PT Doppler signal. His case was discussed with interventional radiology for thrombolysis. He underwent thrombolysis on 1221/2016. During his procedure the patient was noted to have chest pain and was nauseous. He was diaphoretic and his cardiac monitor showed ventricular tachycardia. His troponins were mildly elevated. Cardiology was consulted.  Hospital day 2: The patient denied any further chest  pain. His follow-up venogram showed resolution of ileal caval thrombus. His popliteal sheaths were discontinued. His legs were essentially back at baseline. He had palpable pedal pulses bilaterally Cardiology recommended Coumadin as he has had repeated hematuria on Eliquis. He was started on Coumadin today. His echo was reassuring.  The remainder of his hospitalization included anticoagulation with heparin and Coumadin. The patient did not want to be on chronic Coumadin therapy, therefore hematology was consulted for alternative recommendations and for evaluation of his recurrent thromboembolic disease. Arixtra was recommended. He was discharged home on hospital day 6 in good condition.   CBC    Component Value Date/Time   WBC 6.1 01/16/2015 1326   WBC 4.9 01/13/2015 0458   RBC 4.36 01/16/2015 1326   RBC 4.10* 01/13/2015 0458   HGB 15.7 01/16/2015 1326   HGB 14.9 01/13/2015 0458   HCT 45.0 01/16/2015 1326   HCT 42.3 01/13/2015 0458   PLT 263 01/16/2015 1326   PLT 154 01/13/2015 0458   MCV 103* 01/16/2015 1326   MCV 103.2* 01/13/2015 0458   MCH 36.0* 01/16/2015 1326   MCH 36.3* 01/13/2015 0458   MCHC 34.9 01/16/2015 1326   MCHC 35.2 01/13/2015 0458   RDW 12.5 01/16/2015 1326   RDW 12.8 01/13/2015 0458   LYMPHSABS 1.4 01/16/2015 1326   LYMPHSABS 1.1 01/07/2015 1319   MONOABS 0.9 01/07/2015 1319   EOSABS 0.2 01/16/2015 1326   EOSABS 0.0 01/07/2015 1319   BASOSABS 0.0 01/16/2015 1326   BASOSABS 0.0 01/07/2015 1319    BMET    Component Value Date/Time   NA 143 01/16/2015 1325  NA 142 01/10/2015 0319   K 4.0 01/16/2015 1325   K 3.8 01/10/2015 0319   CL 107 01/10/2015 0319   CO2 27 01/16/2015 1325   CO2 29 01/10/2015 0319   GLUCOSE 110 01/16/2015 1325   GLUCOSE 103* 01/10/2015 0319   BUN 17.6 01/16/2015 1325   BUN 7 01/10/2015 0319   CREATININE 1.3 01/16/2015 1325   CREATININE 1.15 01/10/2015 0319   CALCIUM 9.3 01/16/2015 1325   CALCIUM 8.6* 01/10/2015 0319   GFRNONAA  >60 01/10/2015 0319   GFRAA >60 01/10/2015 0319     Discharge Instructions:   The patient is discharged to home with extensive instructions on wound care and progressive ambulation.  They are instructed not to drive or perform any heavy lifting until returning to see the physician in his office.  Discharge Instructions    Call MD for:  redness, tenderness, or signs of infection (pain, swelling, bleeding, redness, odor or green/yellow discharge around incision site)    Complete by:  As directed      Call MD for:  severe or increased pain, loss or decreased feeling  in affected limb(s)    Complete by:  As directed      Call MD for:  temperature >100.5    Complete by:  As directed      Driving Restrictions    Complete by:  As directed   No driving restrictions     Increase activity slowly    Complete by:  As directed   Walk with assistance use walker or cane as needed     Lifting restrictions    Complete by:  As directed   No lifting restrictions     Resume previous diet    Complete by:  As directed            Discharge Diagnosis:  DVT (deep venous thrombosis) (HCC) [I82.409] 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 Thrombocytopenia (HCC) 01/09/2015  . PVD (peripheral vascular disease) (HCC) 01/09/2015  . Wide-complex tachycardia (HCC) 01/09/2015  . Demand ischemia (HCC) 01/09/2015  . 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 DVT, lower extremity (HCC) 10/14/2014  . Pain of left lower leg 03/18/2014   Past Medical History  Diagnosis Date  . Peripheral vascular disease (HCC)   . Bronchiolitis Feb. 2016  . Pneumonia Feb. 2016  . Anxiety   . Hyperlipidemia   . DVT (deep venous thrombosis) (HCC)   . PVD (peripheral vascular disease) (HCC) 01/09/2015    Right iliac stent 2007  . GERD (gastroesophageal reflux disease)          Medication List    STOP taking these medications        ELIQUIS 5 MG Tabs tablet  Generic drug:  apixaban      TAKE these medications        fondaparinux 7.5 MG/0.6ML Soln injection  Commonly known as:  ARIXTRA  Inject 0.6 mLs (7.5 mg total) into the skin daily.     omeprazole 20 MG capsule  Commonly known as:  PRILOSEC  Take 20 mg by mouth daily.     VENTOLIN HFA 108 (90 Base) MCG/ACT inhaler  Generic drug:  albuterol  as needed.        Disposition: Home  Patient's condition: is Good  Follow up: 1. Dr. Darrick PennaFields in 2 weeks   Maris BergerKimberly Evian Salguero, PA-C [facility] [phone] 01/20/2015  1:50 PM

## 2015-01-22 ENCOUNTER — Other Ambulatory Visit: Payer: Self-pay | Admitting: *Deleted

## 2015-01-22 DIAGNOSIS — I82409 Acute embolism and thrombosis of unspecified deep veins of unspecified lower extremity: Secondary | ICD-10-CM

## 2015-01-22 MED ORDER — FONDAPARINUX SODIUM 7.5 MG/0.6ML ~~LOC~~ SOLN: 7.5000 mg | SUBCUTANEOUS | Status: DC

## 2015-01-23 ENCOUNTER — Encounter: Payer: Self-pay | Admitting: Vascular Surgery

## 2015-01-26 ENCOUNTER — Other Ambulatory Visit: Payer: Self-pay | Admitting: Radiology

## 2015-01-26 DIAGNOSIS — I82409 Acute embolism and thrombosis of unspecified deep veins of unspecified lower extremity: Secondary | ICD-10-CM

## 2015-01-26 DIAGNOSIS — I80203 Phlebitis and thrombophlebitis of unspecified deep vessels of lower extremities, bilateral: Secondary | ICD-10-CM

## 2015-01-29 ENCOUNTER — Ambulatory Visit: Payer: BC Managed Care – PPO | Admitting: Vascular Surgery

## 2015-02-13 ENCOUNTER — Ambulatory Visit (HOSPITAL_BASED_OUTPATIENT_CLINIC_OR_DEPARTMENT_OTHER): Payer: BC Managed Care – PPO

## 2015-02-13 ENCOUNTER — Ambulatory Visit (HOSPITAL_BASED_OUTPATIENT_CLINIC_OR_DEPARTMENT_OTHER): Payer: BC Managed Care – PPO | Admitting: Family

## 2015-02-13 ENCOUNTER — Ambulatory Visit (HOSPITAL_BASED_OUTPATIENT_CLINIC_OR_DEPARTMENT_OTHER)
Admission: RE | Admit: 2015-02-13 | Discharge: 2015-02-13 | Disposition: A | Discharge status: Home / Self Care | Payer: BC Managed Care – PPO | Source: Ambulatory Visit | Attending: Family | Admitting: Family

## 2015-02-13 ENCOUNTER — Other Ambulatory Visit: Payer: Self-pay | Admitting: Family

## 2015-02-13 ENCOUNTER — Other Ambulatory Visit (HOSPITAL_BASED_OUTPATIENT_CLINIC_OR_DEPARTMENT_OTHER): Payer: BC Managed Care – PPO

## 2015-02-13 VITALS — BP 145/93 | HR 88 | Resp 20

## 2015-02-13 DIAGNOSIS — I82431 Acute embolism and thrombosis of right popliteal vein: Secondary | ICD-10-CM | POA: Insufficient documentation

## 2015-02-13 DIAGNOSIS — R519 Headache, unspecified: Secondary | ICD-10-CM

## 2015-02-13 DIAGNOSIS — R51 Headache: Secondary | ICD-10-CM | POA: Diagnosis not present

## 2015-02-13 DIAGNOSIS — M79604 Pain in right leg: Secondary | ICD-10-CM | POA: Diagnosis not present

## 2015-02-13 DIAGNOSIS — Z86718 Personal history of other venous thrombosis and embolism: Secondary | ICD-10-CM

## 2015-02-13 DIAGNOSIS — D582 Other hemoglobinopathies: Secondary | ICD-10-CM

## 2015-02-13 DIAGNOSIS — I10 Essential (primary) hypertension: Secondary | ICD-10-CM | POA: Diagnosis not present

## 2015-02-13 DIAGNOSIS — I159 Secondary hypertension, unspecified: Secondary | ICD-10-CM

## 2015-02-13 DIAGNOSIS — I82411 Acute embolism and thrombosis of right femoral vein: Secondary | ICD-10-CM | POA: Diagnosis not present

## 2015-02-13 DIAGNOSIS — I82890 Acute embolism and thrombosis of other specified veins: Secondary | ICD-10-CM | POA: Diagnosis not present

## 2015-02-13 DIAGNOSIS — R339 Retention of urine, unspecified: Secondary | ICD-10-CM

## 2015-02-13 DIAGNOSIS — I82401 Acute embolism and thrombosis of unspecified deep veins of right lower extremity: Secondary | ICD-10-CM

## 2015-02-13 DIAGNOSIS — I82409 Acute embolism and thrombosis of unspecified deep veins of unspecified lower extremity: Secondary | ICD-10-CM | POA: Diagnosis not present

## 2015-02-13 DIAGNOSIS — M2550 Pain in unspecified joint: Secondary | ICD-10-CM

## 2015-02-13 DIAGNOSIS — M7989 Other specified soft tissue disorders: Secondary | ICD-10-CM | POA: Diagnosis not present

## 2015-02-13 DIAGNOSIS — M79605 Pain in left leg: Secondary | ICD-10-CM | POA: Diagnosis not present

## 2015-02-13 LAB — CBC WITH DIFFERENTIAL (CANCER CENTER ONLY)
BASO#: 0 10*3/uL (ref 0.0–0.2)
BASO%: 0.2 % (ref 0.0–2.0)
EOS ABS: 0.1 10*3/uL (ref 0.0–0.5)
EOS%: 1.6 % (ref 0.0–7.0)
HCT: 51.1 % — ABNORMAL HIGH (ref 38.7–49.9)
HGB: 17.9 g/dL — ABNORMAL HIGH (ref 13.0–17.1)
LYMPH#: 1.4 10*3/uL (ref 0.9–3.3)
LYMPH%: 22.2 % (ref 14.0–48.0)
MCH: 36.4 pg — AB (ref 28.0–33.4)
MCHC: 35 g/dL (ref 32.0–35.9)
MCV: 104 fL — ABNORMAL HIGH (ref 82–98)
MONO#: 0.4 10*3/uL (ref 0.1–0.9)
MONO%: 6.9 % (ref 0.0–13.0)
NEUT#: 4.2 10*3/uL (ref 1.5–6.5)
NEUT%: 69.1 % (ref 40.0–80.0)
PLATELETS: 160 10*3/uL (ref 145–400)
RBC: 4.92 10*6/uL (ref 4.20–5.70)
RDW: 13.8 % (ref 11.1–15.7)
WBC: 6.1 10*3/uL (ref 4.0–10.0)

## 2015-02-13 LAB — COMPREHENSIVE METABOLIC PANEL
ALT: 22 U/L (ref 0–55)
AST: 27 U/L (ref 5–34)
Albumin: 4.2 g/dL (ref 3.5–5.0)
Alkaline Phosphatase: 71 U/L (ref 40–150)
Anion Gap: 11 mEq/L (ref 3–11)
BUN: 10.1 mg/dL (ref 7.0–26.0)
CHLORIDE: 105 meq/L (ref 98–109)
CO2: 30 meq/L — AB (ref 22–29)
Calcium: 9.4 mg/dL (ref 8.4–10.4)
Creatinine: 1.1 mg/dL (ref 0.7–1.3)
EGFR: 78 mL/min/{1.73_m2} — AB (ref >90)
GLUCOSE: 109 mg/dL (ref 70–140)
POTASSIUM: 4.6 meq/L (ref 3.5–5.1)
SODIUM: 146 meq/L — AB (ref 136–145)
TOTAL PROTEIN: 6.9 g/dL (ref 6.4–8.3)
Total Bilirubin: 1.16 mg/dL (ref 0.20–1.20)

## 2015-02-13 LAB — PROTIME-INR (CHCC SATELLITE)
INR: 1 — ABNORMAL LOW (ref 2.0–3.5)
Protime: 12 Seconds (ref 10.6–13.4)

## 2015-02-13 MED ORDER — SODIUM CHLORIDE 0.9 % IV SOLN
Freq: Once | INTRAVENOUS | Status: AC
  Administered 2015-02-13: 15:00:00 via INTRAVENOUS

## 2015-02-13 NOTE — Progress Notes (Signed)
Hematology and Oncology Follow Up Visit  Yurem Viner [id number] 1965-10-13 50 y.o. 02/13/2015   Principle Diagnosis:  1. 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 History of DVT right lower extremity and PE  Current Therapy:   1. S/p Thrombolysis through bilateral lower extremity infusion catheters 2. Arixtra 7.5 mg SQ daily *Lifelong anticoagulation* 3. IVC filter in place    Interim History:  Mr. Keadle is here today for a follow-up. He states that he is feeling fatigued, has a headache and has pain in his joints. His complexion is quite ruddy and his BP is elevated at 148/102. His Hgb today is 17.9 with a Hct of 51.1.  He states that his calves are tender at time and that they feel tight. We rechecked a doppler study on both lower extremities which showed no evidence DVT in the left leg and a partially occlusive thrombus in the distal right femoral vein, popliteal vein extending to the tibioperoneal trunk.  He continues to wear his compression stockings daily.  No swelling or tenderness in is extremities. No numbness or tingling.  He continues on his Arixtra and has had no other episodes of bleeding.  He is still smoking and plans to quit on Valentine's day.  No fever, chills, n/v, cough, rash, dizziness, changes in vision, SOB, chest pain, palpitations, abdominal pain or changes in bowel or bladder habits. He has a good appetite and is staying well hydrated. His weight is stable.  He would like a new PCP so I suggested he try making an appointment with on of the [facility] providers downstairs. He plans to stop on his way out today and make an appointment.   Medications:    Medication List       This list is accurate as of: 02/13/15  2:11 PM.  Always use your most recent med list.               fondaparinux 7.5 MG/0.6ML Soln injection  Commonly known as:  ARIXTRA  Inject 0.6 mLs (7.5 mg total)  into the skin daily.     omeprazole 20 MG capsule  Commonly known as:  PRILOSEC  Take 20 mg by mouth daily.     sulfamethoxazole-trimethoprim 800-160 MG tablet  Commonly known as:  BACTRIM DS,SEPTRA DS  Take 1 tablet by mouth 2 (two) times daily.     tamsulosin 0.4 MG Caps capsule  Commonly known as:  FLOMAX  Take 1 capsule (0.4 mg total) by mouth daily.     VENTOLIN HFA 108 (90 Base) MCG/ACT inhaler  Generic drug:  albuterol  as needed.        Allergies: No Known Allergies  Past Medical History, Surgical history, Social history, and Family History were reviewed and updated.  Review of Systems: All other 10 point review of systems is negative.   Physical Exam:  vitals were not taken for this visit.  Wt Readings from Last 3 Encounters:  01/16/15 175 lb (79.379 kg)  01/09/15 172 lb 9.9 oz (78.3 kg)  10/14/14 166 lb 4.8 oz (75.433 kg)    Ocular: Sclerae unicteric, pupils equal, round and reactive to light Ear-nose-throat: Oropharynx clear, dentition fair Lymphatic: No cervical supraclavicular or axillary adenopathy Lungs no rales or rhonchi, good excursion bilaterally Heart regular rate and rhythm, no murmur appreciated Abd soft, nontender, positive bowel sounds, no liver or spleen tip palpated on exam  MSK no focal spinal tenderness, no  joint edema Neuro: non-focal, well-oriented, appropriate affect Breasts: Deferred  Lab Results  Component Value Date   WBC 6.1 02/13/2015   HGB 17.9* 02/13/2015   HCT 51.1* 02/13/2015   MCV 104* 02/13/2015   PLT 160 02/13/2015   No results found for: FERRITIN, IRON, TIBC, UIBC, IRONPCTSAT Lab Results  Component Value Date   RBC 4.92 02/13/2015   No results found for: KPAFRELGTCHN, LAMBDASER, KAPLAMBRATIO No results found for: Loel Lofty, IGMSERUM No results found for: Marda Stalker, SPEI   Chemistry      Component Value Date/Time   NA 143 01/16/2015 1325   NA 142  01/10/2015 0319   K 4.0 01/16/2015 1325   K 3.8 01/10/2015 0319   CL 107 01/10/2015 0319   CO2 27 01/16/2015 1325   CO2 29 01/10/2015 0319   BUN 17.6 01/16/2015 1325   BUN 7 01/10/2015 0319   CREATININE 1.3 01/16/2015 1325   CREATININE 1.15 01/10/2015 0319      Component Value Date/Time   CALCIUM 9.3 01/16/2015 1325   CALCIUM 8.6* 01/10/2015 0319   ALKPHOS 81 01/16/2015 1325   ALKPHOS 91 01/07/2015 1319   AST 33 01/16/2015 1325   AST 25 01/07/2015 1319   ALT 61* 01/16/2015 1325   ALT 26 01/07/2015 1319   BILITOT 0.97 01/16/2015 1325   BILITOT 2.2* 01/07/2015 1319     Impression and Plan: Mr. Tippin is a very pleasant 50 yo white male with recurrent bilateral lower extremity DVTs.  He currently has an IVC filter in place. Doppler study today showed no evidence of DVT in the left leg and partially occlusive thrombus in the distal right femoral vein, popliteal vein extending to the tibioperoneal trunk. He has done well on Arixtra and will continue on his same dose.  He is symptomatic today with HTN, headache, ruddy complexion, joint aches and elevated Hgb.  I added a JAK2 to his labs to assess for polycythemia. I also spoke with Dr. Myna Hidalgo and it was decided to go ahead and phlebotomize him today  He knows to stay well hydrated.  We will plan to see him back in 1 month for labs and follow-up.  He will contact us with any questions or concerns. We can certainly see him sooner if need be.    Verdie Mosher, NP 1/27/20172:11 PM

## 2015-02-13 NOTE — Patient Instructions (Addendum)

## 2015-02-13 NOTE — Progress Notes (Signed)
Aaron Cunningham presents today for phlebotomy per MD orders. Phlebotomy procedure started at 1435 and ended at 1455. 500 ml  removed. Patient observed for 30 minutes after procedure without any incident. Patient tolerated procedure well. IV needle removed intact.

## 2015-02-14 LAB — APTT: aPTT: 24 s (ref 24–33)

## 2015-02-26 ENCOUNTER — Other Ambulatory Visit: Payer: Self-pay | Admitting: *Deleted

## 2015-02-26 DIAGNOSIS — I82409 Acute embolism and thrombosis of unspecified deep veins of unspecified lower extremity: Secondary | ICD-10-CM

## 2015-02-26 MED ORDER — FONDAPARINUX SODIUM 7.5 MG/0.6ML ~~LOC~~ SOLN: 7.5000 mg | SUBCUTANEOUS | Status: DC

## 2015-03-11 ENCOUNTER — Ambulatory Visit
Admission: RE | Admit: 2015-03-11 | Discharge: 2015-03-11 | Disposition: A | Discharge status: Home / Self Care | Payer: BC Managed Care – PPO | Source: Ambulatory Visit | Attending: Radiology | Admitting: Radiology

## 2015-03-11 DIAGNOSIS — I2692 Saddle embolus of pulmonary artery without acute cor pulmonale: Secondary | ICD-10-CM

## 2015-03-11 DIAGNOSIS — K6289 Other specified diseases of anus and rectum: Secondary | ICD-10-CM | POA: Insufficient documentation

## 2015-03-11 DIAGNOSIS — I82409 Acute embolism and thrombosis of unspecified deep veins of unspecified lower extremity: Secondary | ICD-10-CM

## 2015-03-11 DIAGNOSIS — I70209 Unspecified atherosclerosis of native arteries of extremities, unspecified extremity: Secondary | ICD-10-CM

## 2015-03-11 DIAGNOSIS — D751 Secondary polycythemia: Secondary | ICD-10-CM | POA: Insufficient documentation

## 2015-03-11 DIAGNOSIS — B0223 Postherpetic polyneuropathy: Secondary | ICD-10-CM | POA: Insufficient documentation

## 2015-03-11 DIAGNOSIS — F419 Anxiety disorder, unspecified: Secondary | ICD-10-CM | POA: Insufficient documentation

## 2015-03-11 DIAGNOSIS — J40 Bronchitis, not specified as acute or chronic: Secondary | ICD-10-CM | POA: Insufficient documentation

## 2015-03-11 DIAGNOSIS — I80203 Phlebitis and thrombophlebitis of unspecified deep vessels of lower extremities, bilateral: Secondary | ICD-10-CM

## 2015-03-11 DIAGNOSIS — R0789 Other chest pain: Secondary | ICD-10-CM | POA: Insufficient documentation

## 2015-03-11 DIAGNOSIS — Z720 Tobacco use: Secondary | ICD-10-CM | POA: Insufficient documentation

## 2015-03-11 DIAGNOSIS — K2100 Gastro-esophageal reflux disease with esophagitis, without bleeding: Secondary | ICD-10-CM | POA: Insufficient documentation

## 2015-03-11 DIAGNOSIS — I824Z2 Acute embolism and thrombosis of unspecified deep veins of left distal lower extremity: Secondary | ICD-10-CM

## 2015-03-11 DIAGNOSIS — R319 Hematuria, unspecified: Secondary | ICD-10-CM | POA: Insufficient documentation

## 2015-03-11 DIAGNOSIS — G4700 Insomnia, unspecified: Secondary | ICD-10-CM | POA: Insufficient documentation

## 2015-03-11 DIAGNOSIS — Z7901 Long term (current) use of anticoagulants: Secondary | ICD-10-CM | POA: Insufficient documentation

## 2015-03-11 DIAGNOSIS — E785 Hyperlipidemia, unspecified: Secondary | ICD-10-CM | POA: Insufficient documentation

## 2015-03-11 HISTORY — DX: Acute embolism and thrombosis of unspecified deep veins of left distal lower extremity: I82.4Z2

## 2015-03-11 HISTORY — DX: Saddle embolus of pulmonary artery without acute cor pulmonale: I26.92

## 2015-03-11 HISTORY — DX: Unspecified atherosclerosis of native arteries of extremities, unspecified extremity: I70.209

## 2015-03-11 NOTE — Consult Note (Signed)
Chief Complaint: Patient was seen in consultation today for  Chief Complaint  Patient presents with  . Advice Only    Consult for possible IVC filter   at the request of Bruning,Kevin  Referring Physician(s): Bruning,Kevin  History of Present Illness: Aaron Cunningham is a 50 y.o. 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 The filter was placed 06/12/2014 by Dr. Logan Bores at [facility]. He had previously been on telemetry request secondary to DVT and PE, had bleeding complications. It is a non-retrievable [employer] TrapEase type. Its low in the infrarenal IVC. The patient had follow-up venous Doppler 02/13/2015 which demonstrated residual partially occlusive thrombus in the distal right femoral vein, popliteal vein, and tibioperoneal trunk. He's currently on Arixtra with plans for lifelong anticoagulation. He is interested in discussing possible filter removal. We had seen him back in 11/23/2008 for arterial thrombolyzes secondary to clot and a right common iliac stent with distal embolization. There is concern of hypercoagulable state.  Past Medical History  Diagnosis Date  . Peripheral vascular disease (HCC)   . Bronchiolitis Feb. 2016  . Pneumonia Feb. 2016  . Anxiety   . Hyperlipidemia   . DVT (deep venous thrombosis) (HCC)   . PVD (peripheral vascular disease) (HCC) 01/09/2015    Right iliac stent 2007  . GERD (gastroesophageal reflux disease)     Past Surgical History  Procedure Laterality Date  . Thrombolysis Right Nov. 9, 2010    Lower Extrim.  . Hand surgery    . Rotator cuff repair    . Tonsillectomy    . Ivc filter placement ([facility] hx)  2016     Allergies: Review of patient's allergies indicates no known allergies.  Medications: Prior to Admission medications   Medication Sig Start Date End Date Taking? Authorizing Provider    fondaparinux (ARIXTRA) 7.5 MG/0.6ML SOLN injection Inject 0.6 mLs (7.5 mg total) into the skin daily. 02/26/15  Yes [name], MD  omeprazole (PRILOSEC) 20 MG capsule Take 20 mg by mouth daily.   Yes Historical Provider, MD  sulfamethoxazole-trimethoprim (BACTRIM DS,SEPTRA DS) 800-160 MG tablet Take 1 tablet by mouth 2 (two) times daily. Patient not taking: Reported on 03/11/2015 01/15/15   [name], MD  tamsulosin (FLOMAX) 0.4 MG CAPS capsule Take 1 capsule (0.4 mg total) by mouth daily. 01/16/15   Verdie Mosher, NP  VENTOLIN HFA 108 (90 BASE) MCG/ACT inhaler as needed. Reported on 03/11/2015 03/07/14   Historical Provider, MD     Family History  Problem Relation Age of Onset  . Deep vein thrombosis Father   . Heart disease Father 58    Before age 27  . Hyperlipidemia Father   . Heart attack Father   . Stroke Father     Social History   Social History  . Marital Status: Married    Spouse Name: N/A  . Number of Children: N/A  . Years of Education: N/A   Social History Main Topics  . Smoking status: Former Smoker -- 1.00 packs/day for 30 years    Types: Cigarettes    Quit date: 01/07/2015  . Smokeless tobacco: Never Used  . Alcohol Use: 0.6 oz/week    1 Glasses of wine per week  . Drug Use: No  . Sexual Activity: Not on file   Other Topics Concern  . Not on file   Social History  Narrative    ECOG Status: 0 - Asymptomatic  Review of Systems: A 12 point ROS discussed and pertinent positives are indicated in the HPI above.  All other systems are negative.  Review of Systems  Vital Signs: BP 153/98 mmHg  Pulse 87  Temp(Src) 98.8 F (37.1 C) (Oral)  Resp 14  SpO2 99%  Physical Exam  Mallampati Score:     Imaging: US Venous Img Lower Bilateral  02/13/2015  CLINICAL DATA:  History of DVT. Bilateral lower extremity pain and swelling. EXAM: BILATERAL LOWER EXTREMITY VENOUS DOPPLER ULTRASOUND TECHNIQUE: Gray-scale sonography with graded  compression, as well as color Doppler and duplex ultrasound were performed to evaluate the lower extremity deep venous systems from the level of the common femoral vein and including the common femoral, femoral, profunda femoral, popliteal and calf veins including the posterior tibial, peroneal and gastrocnemius veins when visible. The superficial great saphenous vein was also interrogated. Spectral Doppler was utilized to evaluate flow at rest and with distal augmentation maneuvers in the common femoral, femoral and popliteal veins. COMPARISON:  None. FINDINGS: RIGHT LOWER EXTREMITY Common Femoral Vein: No evidence of thrombus. Normal compressibility, respiratory phasicity and response to augmentation. Saphenofemoral Junction: No evidence of thrombus. Normal compressibility and flow on color Doppler imaging. Profunda Femoral Vein: No evidence of thrombus. Normal compressibility and flow on color Doppler imaging. Femoral Vein: Partially occlusive thrombus noted in the distal femoral vein. Popliteal Vein: Partially occlusive thrombus noted. Calf Veins: Partially occlusive thrombus at the tibioperoneal trunk. Superficial Great Saphenous Vein: No evidence of thrombus. Normal compressibility and flow on color Doppler imaging. Venous Reflux:  None. Other Findings:  None. LEFT LOWER EXTREMITY Common Femoral Vein: No evidence of thrombus. Normal compressibility, respiratory phasicity and response to augmentation. Saphenofemoral Junction: No evidence of thrombus. Normal compressibility and flow on color Doppler imaging. Profunda Femoral Vein: No evidence of thrombus. Normal compressibility and flow on color Doppler imaging. Femoral Vein: No evidence of thrombus. Normal compressibility, respiratory phasicity and response to augmentation. Popliteal Vein: No evidence of thrombus. Normal compressibility, respiratory phasicity and response to augmentation. Calf Veins: No evidence of thrombus. Normal compressibility and flow on  color Doppler imaging. Superficial Great Saphenous Vein: No evidence of thrombus. Normal compressibility and flow on color Doppler imaging. Venous Reflux:  None. Other Findings:  None. IMPRESSION: Partially occlusive thrombus in the distal right femoral vein, popliteal vein extending to the tibioperoneal trunk. It is difficult to determine if this is chronic or acute DVT. No evidence of deep venous thrombosis on the left. Attempts are being made at this time to contact Dr. Myna Hidalgo with these results. Electronically Signed   By: Charlett Nose M.D.   On: 02/13/2015 19:48    Labs:  CBC:  Recent Labs  01/10/15 0319 01/13/15 0458 01/16/15 1326 02/13/15 1303  WBC 5.0 4.9 6.1 6.1  HGB 13.9 14.9 15.7 17.9*  HCT 40.5 42.3 45.0 51.1*  PLT 71* 154 263 160    COAGS:  Recent Labs  01/07/15 1319  01/12/15 0533 01/13/15 0458 01/16/15 1326 01/16/15 1326 02/13/15 1303  INR 1.19  < > 1.17 1.45  --  1.5* 1.0*  APTT 165*  --   --   --  33  --   --   < > = values in this interval not displayed.  BMP:  Recent Labs  01/07/15 1918 01/08/15 0321 01/09/15 1015 01/10/15 0319 01/16/15 1325 02/13/15 1304  NA 137 137 142 142 143 146*  K 5.8* 4.5 3.9 3.8  4.0 4.6  CL 103 106 109 107  --   --   CO2 30*  GLUCOSE 123* 119* 137* 103* 110 109  BUN 17.6 10.1  CALCIUM 8.2* 7.8* 8.2* 8.6* 9.3 9.4  CREATININE 1.79* 1.74* 1.18 1.15 1.3 1.1  GFRNONAA 43* 44* >60 >60  --   --   GFRAA 50* 51* >60 >60  --   --     LIVER FUNCTION TESTS:  Recent Labs  01/07/15 1319 01/16/15 1325 02/13/15 1304  BILITOT 2.2* 0.97 1.16  AST 25 33 27  ALT 26 61* 22  ALKPHOS 91 81 71  PROT 7.2 7.0 6.9  ALBUMIN 4.5 4.2 4.2    TUMOR MARKERS: No results for input(s): AFPTM, CEA, CA199, CHROMGRNA in the last 8760 hours.  Assessment and Plan:  My impression is that this patient with a hypercoagulable state is currently tolerating anticoagulation and would classically be a candidate for IVC  filter removal. However, his model of filter is not designed for  removal. This designed with long struts against  the caval wall results in caval wall incorporation, significantly increasing risk of intimal injury with attempted removal. The advantage of this model is its low rates of filter fracture, caval wall penetration, and migration. I think it safe to leave it in place, especially with continuation of anticoagulation. It should not limit his activities. Removal has a chance of making him worse. He seemed to understand. He feels like his filter is protective and was not inclined to go to heroic measures to have it removed in any case. I think this is reasonable. Thank you for this interesting consult.  I greatly enjoyed meeting [employer] and look forward to participating in their care.  A copy of this report was sent to the requesting provider on this date.  Electronically Signed: Everline Mahaffy III, DAYNE Naylani Bradner 03/11/2015, 5:36 PM   I spent a total of    15 Minutes in face to face in clinical consultation, greater than 50% of which was counseling/coordinating care for IVC filter.

## 2015-03-17 ENCOUNTER — Encounter: Payer: Self-pay | Admitting: Family

## 2015-03-20 ENCOUNTER — Encounter: Payer: Self-pay | Admitting: Family

## 2015-03-20 ENCOUNTER — Ambulatory Visit (HOSPITAL_BASED_OUTPATIENT_CLINIC_OR_DEPARTMENT_OTHER): Payer: BC Managed Care – PPO

## 2015-03-20 ENCOUNTER — Ambulatory Visit (HOSPITAL_BASED_OUTPATIENT_CLINIC_OR_DEPARTMENT_OTHER): Payer: BC Managed Care – PPO | Admitting: Family

## 2015-03-20 ENCOUNTER — Other Ambulatory Visit (HOSPITAL_BASED_OUTPATIENT_CLINIC_OR_DEPARTMENT_OTHER): Payer: BC Managed Care – PPO

## 2015-03-20 VITALS — BP 164/94 | HR 68 | Temp 98.3°F | Resp 16 | Ht 69.0 in | Wt 165.0 lb

## 2015-03-20 DIAGNOSIS — I82409 Acute embolism and thrombosis of unspecified deep veins of unspecified lower extremity: Secondary | ICD-10-CM

## 2015-03-20 DIAGNOSIS — D582 Other hemoglobinopathies: Secondary | ICD-10-CM

## 2015-03-20 DIAGNOSIS — R519 Headache, unspecified: Secondary | ICD-10-CM

## 2015-03-20 DIAGNOSIS — I82403 Acute embolism and thrombosis of unspecified deep veins of lower extremity, bilateral: Secondary | ICD-10-CM

## 2015-03-20 DIAGNOSIS — R51 Headache: Secondary | ICD-10-CM

## 2015-03-20 DIAGNOSIS — Z7901 Long term (current) use of anticoagulants: Secondary | ICD-10-CM | POA: Diagnosis not present

## 2015-03-20 DIAGNOSIS — I159 Secondary hypertension, unspecified: Secondary | ICD-10-CM

## 2015-03-20 LAB — CMP (CANCER CENTER ONLY)
ALK PHOS: 44 U/L (ref 26–84)
ALT: 36 U/L (ref 10–47)
AST: 34 U/L (ref 11–38)
Albumin: 3.7 g/dL (ref 3.3–5.5)
BILIRUBIN TOTAL: 0.7 mg/dL (ref 0.20–1.60)
BUN: 10 mg/dL (ref 7–22)
CO2: 29 meq/L (ref 18–33)
Calcium: 8.4 mg/dL (ref 8.0–10.3)
Chloride: 104 mEq/L (ref 98–108)
Creat: 0.7 mg/dl (ref 0.6–1.2)
GLUCOSE: 109 mg/dL (ref 73–118)
Potassium: 3.6 mEq/L (ref 3.3–4.7)
Sodium: 141 mEq/L (ref 128–145)
Total Protein: 6.4 g/dL (ref 6.4–8.1)

## 2015-03-20 LAB — CBC WITH DIFFERENTIAL (CANCER CENTER ONLY)
BASO#: 0 10*3/uL (ref 0.0–0.2)
BASO%: 0.2 % (ref 0.0–2.0)
EOS%: 1.9 % (ref 0.0–7.0)
Eosinophils Absolute: 0.1 10*3/uL (ref 0.0–0.5)
HEMATOCRIT: 48.8 % (ref 38.7–49.9)
HEMOGLOBIN: 17 g/dL (ref 13.0–17.1)
LYMPH#: 1.4 10*3/uL (ref 0.9–3.3)
LYMPH%: 27 % (ref 14.0–48.0)
MCH: 34.8 pg — ABNORMAL HIGH (ref 28.0–33.4)
MCHC: 34.8 g/dL (ref 32.0–35.9)
MCV: 100 fL — ABNORMAL HIGH (ref 82–98)
MONO#: 0.6 10*3/uL (ref 0.1–0.9)
MONO%: 11.6 % (ref 0.0–13.0)
NEUT%: 59.3 % (ref 40.0–80.0)
NEUTROS ABS: 3.2 10*3/uL (ref 1.5–6.5)
Platelets: 171 10*3/uL (ref 145–400)
RBC: 4.88 10*6/uL (ref 4.20–5.70)
RDW: 12.2 % (ref 11.1–15.7)
WBC: 5.3 10*3/uL (ref 4.0–10.0)

## 2015-03-20 LAB — PROTIME-INR (CHCC SATELLITE)
INR: 1 — AB (ref 2.0–3.5)
Protime: 12 Seconds (ref 10.6–13.4)

## 2015-03-20 NOTE — Progress Notes (Signed)
Hematology and Oncology Follow Up Visit  Aaron Cunningham [id number] 1965/10/06 50 y.o. 03/20/2015   Principle Diagnosis:  1. 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 History of DVT right lower extremity and PE  Current Therapy:   1. S/p Thrombolysis through bilateral lower extremity infusion catheters 2. Arixtra 7.5 mg SQ daily *Lifelong anticoagulation* 3. IVC filter in place    Interim History:  [name] is here today for a follow-up. He is still having some joint pain in his hands. His complexion is still quite ruddy and his BP is elevated at 165/74.  He has had a nice response on Arixtra and has had no other episodes of bleeding. His mist recent US showed no evidence of DVT in the left leg and a partially occlusive thrombus in the distal right femoral vein, popliteal vein extending to the tibioperoneal trunk. He has some pain in the left leg that comes and goes. He continues to wear his compression stockings daily which helps.  He is making sure to stay well hydrated. He has maintained a good appetite and is staying well hydrated.  No swelling or tenderness in is extremities. No numbness or tingling.  He is still smoking.  No fever, chills, n/v, cough, rash, dizziness, changes in vision, SOB, chest pain, palpitations, abdominal pain or changes in bowel or bladder habits.  Medications:    Medication List       This list is accurate as of: 03/20/15  3:16 PM.  Always use your most recent med list.               fondaparinux 7.5 MG/0.6ML Soln injection  Commonly known as:  ARIXTRA  Inject 0.6 mLs (7.5 mg total) into the skin daily.     omeprazole 20 MG capsule  Commonly known as:  PRILOSEC  Take 20 mg by mouth daily.     VENTOLIN HFA 108 (90 Base) MCG/ACT inhaler  Generic drug:  albuterol  as needed. Reported on 03/20/2015        Allergies: No Known Allergies  Past Medical History,  Surgical history, Social history, and Family History were reviewed and updated.  Review of Systems: All other 10 point review of systems is negative.   Physical Exam:  vitals were not taken for this visit.  Wt Readings from Last 3 Encounters:  01/16/15 175 lb (79.379 kg)  01/09/15 172 lb 9.9 oz (78.3 kg)  10/14/14 166 lb 4.8 oz (75.433 kg)    Ocular: Sclerae unicteric, pupils equal, round and reactive to light Ear-nose-throat: Oropharynx clear, dentition fair Lymphatic: No cervical supraclavicular or axillary adenopathy Lungs no rales or rhonchi, good excursion bilaterally Heart regular rate and rhythm, no murmur appreciated Abd soft, nontender, positive bowel sounds, no liver or spleen tip palpated on exam  MSK no focal spinal tenderness, no joint edema Neuro: non-focal, well-oriented, appropriate affect Breasts: Deferred  Lab Results  Component Value Date   WBC 5.3 03/20/2015   HGB 17.0 03/20/2015   HCT 48.8 03/20/2015   MCV 100* 03/20/2015   PLT 171 03/20/2015   No results found for: FERRITIN, IRON, TIBC, UIBC, IRONPCTSAT Lab Results  Component Value Date   RBC 4.88 03/20/2015   No results found for: KPAFRELGTCHN, LAMBDASER, KAPLAMBRATIO No results found for: IGGSERUM, IGA, IGMSERUM No results found for: TOTALPROTELP, ALBUMINELP, A1GS, A2GS, BETS, BETA2SER, GAMS, MSPIKE, SPEI   Chemistry      Component Value Date/Time  NA 146* 02/13/2015 1304   NA 142 01/10/2015 0319   K 4.6 02/13/2015 1304   K 3.8 01/10/2015 0319   CL 107 01/10/2015 0319   CO2 30* 02/13/2015 1304   CO2 29 01/10/2015 0319   BUN 10.1 02/13/2015 1304   BUN 7 01/10/2015 0319   CREATININE 1.1 02/13/2015 1304   CREATININE 1.15 01/10/2015 0319      Component Value Date/Time   CALCIUM 9.4 02/13/2015 1304   CALCIUM 8.6* 01/10/2015 0319   ALKPHOS 71 02/13/2015 1304   ALKPHOS 91 01/07/2015 1319   AST 27 02/13/2015 1304   AST 25 01/07/2015 1319   ALT 22 02/13/2015 1304   ALT 26 01/07/2015 1319     BILITOT 1.16 02/13/2015 1304   BILITOT 2.2* 01/07/2015 1319     Impression and Plan: Mr. Rockhill is a very pleasant 50 yo white male with recurrent bilateral lower extremity DVTs. He currently has an IVC filter in place. He has had a nice response to Arixtra and will continue on his same dose.  His Hgb is 17.0 with an MCV of 100. He is still quite [place] and hypertensive. We will phlebotomize him again today. I also added iron studies and an erythropoietin level to his lab work.  He was JAK-2 negative.  We will plan to see him back in 1 month for labs and follow-up.  He will contact us with any questions or concerns. We can certainly see him sooner if need be.    Verdie Mosher, NP 3/3/20173:16 PM

## 2015-03-20 NOTE — Patient Instructions (Signed)

## 2015-03-20 NOTE — Progress Notes (Signed)
Aaron Cunningham presents today for phlebotomy per MD orders. Phlebotomy procedure started at 1545 and ended at 1555. 500 grams removed. Patient observed for 30 minutes after procedure without any incident. Patient tolerated procedure well. IV needle removed intact.

## 2015-03-21 LAB — ERYTHROPOIETIN: ERYTHROPOIETIN: 9.1 m[IU]/mL (ref 2.6–18.5)

## 2015-03-23 LAB — IRON AND TIBC
%SAT: 7 % — ABNORMAL LOW (ref 20–55)
IRON: 31 ug/dL — AB (ref 42–163)
TIBC: 453 ug/dL — AB (ref 202–409)
UIBC: 421 ug/dL — ABNORMAL HIGH (ref 117–376)

## 2015-03-23 LAB — FERRITIN: Ferritin: 11 ng/ml — ABNORMAL LOW (ref 22–316)

## 2015-03-24 ENCOUNTER — Ambulatory Visit (INDEPENDENT_AMBULATORY_CARE_PROVIDER_SITE_OTHER): Payer: BC Managed Care – PPO | Admitting: Family

## 2015-03-24 ENCOUNTER — Encounter: Payer: Self-pay | Admitting: Family

## 2015-03-24 ENCOUNTER — Other Ambulatory Visit: Payer: Self-pay | Admitting: *Deleted

## 2015-03-24 ENCOUNTER — Ambulatory Visit (HOSPITAL_COMMUNITY)
Admission: RE | Admit: 2015-03-24 | Discharge: 2015-03-24 | Disposition: A | Discharge status: Home / Self Care | Payer: BC Managed Care – PPO | Source: Ambulatory Visit | Attending: Family | Admitting: Family

## 2015-03-24 ENCOUNTER — Ambulatory Visit (INDEPENDENT_AMBULATORY_CARE_PROVIDER_SITE_OTHER)
Admission: RE | Admit: 2015-03-24 | Discharge: 2015-03-24 | Disposition: A | Discharge status: Home / Self Care | Payer: BC Managed Care – PPO | Source: Ambulatory Visit | Attending: Family | Admitting: Family

## 2015-03-24 VITALS — BP 137/90 | HR 72 | Temp 97.3°F | Resp 18 | Ht 69.5 in | Wt 166.6 lb

## 2015-03-24 DIAGNOSIS — I779 Disorder of arteries and arterioles, unspecified: Secondary | ICD-10-CM

## 2015-03-24 DIAGNOSIS — I739 Peripheral vascular disease, unspecified: Secondary | ICD-10-CM

## 2015-03-24 DIAGNOSIS — F17200 Nicotine dependence, unspecified, uncomplicated: Secondary | ICD-10-CM

## 2015-03-24 DIAGNOSIS — Z95828 Presence of other vascular implants and grafts: Secondary | ICD-10-CM

## 2015-03-24 DIAGNOSIS — Z720 Tobacco use: Secondary | ICD-10-CM

## 2015-03-24 DIAGNOSIS — E785 Hyperlipidemia, unspecified: Secondary | ICD-10-CM | POA: Diagnosis not present

## 2015-03-24 NOTE — Progress Notes (Signed)
Filed Vitals:   03/24/15 0929 03/24/15 0931  BP: 140/92 137/90  Pulse: 72   Temp: 97.3 F (36.3 C)   TempSrc: Oral   Resp: 18   Height: 5' 9.5" (1.765 m)   Weight: 166 lb 9.6 oz (75.569 kg)   SpO2: 97%

## 2015-03-24 NOTE — Progress Notes (Signed)
[facility]  HISTORY AND PHYSICAL -PAD  History of Present Illness [place] Cunningham is a 50 y.o. male  former patient of Dr. Madilyn FiremanHayes who returns today for evaluation of his PAD. He was lost to follow up for a while. He is s/p right CIA stent with PTA in 2007 and right LE thrombolysis in 2010 by Dr. Hart RochesterLawson. Review of records: February 2014 ABI's were in the normal range with bi and triphasic waveforms, digit pressures were normal range. He reports left calf tight feeling started 2-3 days ago, same with walking, sitting, lying down, feels sore. Stretching temporarily relieves this. He walks as part of his job most of his day, 7 days/week, very little sitting. He denies non healing wounds.   Pt denies any history of stroke or TIA.  The patient reports New Medical or Surgical History: treated for kidney stones in [place], legs started swelling, he was found here to have bilateral lower legs DVT. In June or July of 2016 he had PE, he had a venous filter placed. Pt states he is under evaluation by a hematologist to see why he is having venous clotting issues.  Pt Diabetic: No Pt smoker: smoker (1/2 ppd, started at age 50 yrs)  Pt meds include: Statin :No pt quit taking on his own volition, advised pt to let his PCP know ASA: Yes Other anticoagulants/antiplatelets: Arixtra started for DVT in December 2016     Past Medical History  Diagnosis Date  . Peripheral vascular disease (HCC)   . Bronchiolitis Feb. 2016  . Pneumonia Feb. 2016  . Anxiety   . Hyperlipidemia   . DVT (deep venous thrombosis) (HCC)   . PVD (peripheral vascular disease) (HCC) 01/09/2015    Right iliac stent 2007  . GERD (gastroesophageal reflux disease)     Social History Social History  Substance Use Topics  . Smoking status: Former Smoker -- 1.00 packs/day for 30 years    Types: Cigarettes    Quit date: 01/07/2015  . Smokeless tobacco: Never Used  . Alcohol Use: 0.6 oz/week     1 Glasses of wine per week    Family History Family History  Problem Relation Age of Onset  . Deep vein thrombosis Father   . Heart disease Father [id number]    Before age 50  . Hyperlipidemia Father   . Heart attack Father   . Stroke Father     Past Surgical History  Procedure Laterality Date  . Thrombolysis Right Nov. 9, 2010    Lower Extrim.  . Hand surgery    . Rotator cuff repair    . Tonsillectomy    . Ivc filter placement ([facility] hx)  2016     No Known Allergies  Current Outpatient Prescriptions  Medication Sig Dispense Refill  . fondaparinux (ARIXTRA) 7.5 MG/0.6ML SOLN injection Inject 0.6 mLs (7.5 mg total) into the skin daily. 18 mL 3  . omeprazole (PRILOSEC) 20 MG capsule Take 20 mg by mouth daily.    . VENTOLIN HFA 108 (90 BASE) MCG/ACT inhaler as needed. Reported on 03/24/2015  0   No current facility-administered medications for this visit.    ROS: See HPI for pertinent positives and negatives.   Physical Examination  Filed Vitals:   03/24/15 0929 03/24/15 0931  BP: 140/92 137/90  Pulse: 72   Temp: 97.3 F (36.3 C)   TempSrc: Oral   Resp: 18   Height: 5' 9.5" (1.765 m)   Weight: 166 lb 9.6 oz (  75.569 kg)   SpO2: 97%    Body mass index is 24.26 kg/(m^2).   General: A&O x 3, WDWN. Gait: normal Eyes: PERRLA. Pulmonary: CTAB, without wheezes , rales or rhonchi. Cardiac: regular Rythm , without detected murmur.     Carotid Bruits Right Left   Negative Negative  Aorta is not palpable. Radial pulses: 2+ palpable and =   VASCULAR EXAM: Extremities without ischemic changes  without Gangrene; without open wounds. No swelling or edema in lower legs.     LE Pulses Right Left   FEMORAL 2+ palpable 1+ palpable    POPLITEAL not palpable  1+ palpable    POSTERIOR TIBIAL  not palpable   palpable    DORSALIS PEDIS  ANTERIOR TIBIAL not palpable  palpable    Abdomen: soft, NT, no palpable masses. Skin: no rashes, no ulcers. Musculoskeletal: no muscle wasting or atrophy. Neurologic: A&O X 3; Appropriate Affect ; SENSATION: normal; MOTOR FUNCTION: moving all extremities equally, motor strength 5/5 throughout. Speech is fluent/normal. CN 2-12 intact.            Non-Invasive Vascular Imaging: DATE: 03/24/2015 Aortoiliac Duplex: Decreased visualization of the abdominal vasculature due to overlying bowel gas. Unable to adequately visualize the abdominal vasculature due to overlying bowel gas. Triphasic external iliac artery flow and normal ABI suggest a patent right CIA stent with no significant stenosis. No significant stenosis of the EIA. Maximum velocity of the right EIA appears less than previously recorded when compared to 03/18/14 exam.    ABI (Date: 03/24/2015)  R: 1.07 (1.12, 03/18/14), DP: triphasic, PT: triphasic, TBI: 0.71  L: 1.10 (1.12), DP: triphasic, PT: triphasic, TBI: 0.70   ASSESSMENT: Aaron Cunningham is a 50 y.o. male who is s/p right CIA stent with PTA in 2007 and right LE thrombolysis in 2010 by Dr. Hart Rochester. Review of records: February 2014 ABI's were in the normal range with bi and triphasic waveforms, digit pressures were normal range.  Today's right aortoiliac duplex suggests decreased visualization of the abdominal vasculature due to overlying bowel gas. Unable to adequately visualize the abdominal vasculature due to overlying bowel gas. Triphasic external iliac artery flow and normal ABI suggest a patent right CIA stent with no significant stenosis. No significant stenosis of the EIA. Maximum velocity of the right EIA appears less than previously recorded when compared to 03/18/14 exam.  Bilateral ABI's and TBI's are normal with all triphasic waveforms.   He had a PE in  July 2016 and DVT in both calves in December 2016, is taking Arixtra. He has an IVC filter, placed in 2016. 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    PLAN:  Based on the patient's vascular studies and examination, pt will return to clinic in 1 year with right aortoiliac duplex and ABI's.   I discussed in depth with the patient the nature of atherosclerosis, and emphasized the importance of maximal medical management including strict control of blood pressure, blood glucose, and lipid levels, obtaining regular exercise, and cessation of smoking.  The patient is aware that without maximal medical management the underlying atherosclerotic disease process will progress, limiting the benefit of any interventions.  The patient was given information about PAD including signs, symptoms, treatment, what symptoms should prompt the patient to seek immediate medical care, and risk reduction measures to take.  Charisse March, RN, MSN, FNP-C [facility] Phone: [phone]  Clinic MD: Hart Rochester  03/24/2015 9:26 AM

## 2015-03-24 NOTE — Patient Instructions (Signed)

## 2015-03-30 ENCOUNTER — Other Ambulatory Visit: Payer: Self-pay | Admitting: *Deleted

## 2015-03-30 DIAGNOSIS — I82409 Acute embolism and thrombosis of unspecified deep veins of unspecified lower extremity: Secondary | ICD-10-CM

## 2015-03-30 MED ORDER — FONDAPARINUX SODIUM 7.5 MG/0.6ML ~~LOC~~ SOLN: 7.5000 mg | SUBCUTANEOUS | Status: DC

## 2015-04-23 ENCOUNTER — Encounter: Payer: Self-pay | Admitting: Family

## 2015-04-23 ENCOUNTER — Other Ambulatory Visit (HOSPITAL_BASED_OUTPATIENT_CLINIC_OR_DEPARTMENT_OTHER): Payer: BC Managed Care – PPO

## 2015-04-23 ENCOUNTER — Ambulatory Visit (HOSPITAL_BASED_OUTPATIENT_CLINIC_OR_DEPARTMENT_OTHER): Payer: BC Managed Care – PPO | Admitting: Family

## 2015-04-23 VITALS — BP 148/94 | HR 77 | Temp 98.8°F | Resp 18 | Ht 69.0 in | Wt 170.0 lb

## 2015-04-23 DIAGNOSIS — I82403 Acute embolism and thrombosis of unspecified deep veins of lower extremity, bilateral: Secondary | ICD-10-CM

## 2015-04-23 DIAGNOSIS — Z7901 Long term (current) use of anticoagulants: Secondary | ICD-10-CM | POA: Diagnosis not present

## 2015-04-23 DIAGNOSIS — I82413 Acute embolism and thrombosis of femoral vein, bilateral: Secondary | ICD-10-CM

## 2015-04-23 DIAGNOSIS — D582 Other hemoglobinopathies: Secondary | ICD-10-CM

## 2015-04-23 LAB — PROTIME-INR (CHCC SATELLITE)
INR: 1 — AB (ref 2.0–3.5)
PROTIME: 12 s (ref 10.6–13.4)

## 2015-04-23 LAB — CBC WITH DIFFERENTIAL (CANCER CENTER ONLY)
BASO#: 0 10*3/uL (ref 0.0–0.2)
BASO%: 0.2 % (ref 0.0–2.0)
EOS%: 3.1 % (ref 0.0–7.0)
Eosinophils Absolute: 0.2 10*3/uL (ref 0.0–0.5)
HEMATOCRIT: 48.3 % (ref 38.7–49.9)
HEMOGLOBIN: 16.7 g/dL (ref 13.0–17.1)
LYMPH#: 1.9 10*3/uL (ref 0.9–3.3)
LYMPH%: 33.2 % (ref 14.0–48.0)
MCH: 33.1 pg (ref 28.0–33.4)
MCHC: 34.6 g/dL (ref 32.0–35.9)
MCV: 96 fL (ref 82–98)
MONO#: 0.5 10*3/uL (ref 0.1–0.9)
MONO%: 9 % (ref 0.0–13.0)
NEUT%: 54.5 % (ref 40.0–80.0)
NEUTROS ABS: 3.2 10*3/uL (ref 1.5–6.5)
Platelets: 229 10*3/uL (ref 145–400)
RBC: 5.05 10*6/uL (ref 4.20–5.70)
RDW: 12.9 % (ref 11.1–15.7)
WBC: 5.8 10*3/uL (ref 4.0–10.0)

## 2015-04-23 LAB — COMPREHENSIVE METABOLIC PANEL (CC13)
A/G RATIO: 1.9 (ref 1.2–2.2)
ALK PHOS: 58 IU/L (ref 39–117)
ALT: 24 IU/L (ref 0–44)
AST: 23 IU/L (ref 0–40)
Albumin, Serum: 4.3 g/dL (ref 3.5–5.5)
BUN/Creatinine Ratio: 15 (ref 9–20)
BUN: 16 mg/dL (ref 6–24)
Bilirubin Total: 0.4 mg/dL (ref 0.0–1.2)
CHLORIDE: 101 mmol/L (ref 96–106)
Calcium, Ser: 9.5 mg/dL (ref 8.7–10.2)
Carbon Dioxide, Total: 24 mmol/L (ref 18–29)
Creatinine, Ser: 1.08 mg/dL (ref 0.76–1.27)
GFR calc Af Amer: 93 mL/min/{1.73_m2} (ref >59)
GFR calc non Af Amer: 80 mL/min/{1.73_m2} (ref >59)
GLOBULIN, TOTAL: 2.3 g/dL (ref 1.5–4.5)
Glucose: 110 mg/dL — ABNORMAL HIGH (ref 65–99)
POTASSIUM: 4.6 mmol/L (ref 3.5–5.2)
SODIUM: 134 mmol/L (ref 134–144)
Total Protein: 6.6 g/dL (ref 6.0–8.5)

## 2015-04-23 NOTE — Progress Notes (Signed)
Hematology and Oncology Follow Up Visit  Aaron FallsCharles Scott Cunningham [id number] [birth year] 50 y.o. 04/23/2015   Principle Diagnosis:  1. 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 History of DVT right lower extremity and PE  Current Therapy:   1. S/p Thrombolysis through bilateral lower extremity infusion catheters 2. Arixtra 7.5 mg SQ daily *Lifelong anticoagulation* 3. IVC filter in place    Interim History:  [name] is here today for a follow-up. He is doing well and feeling much better. He has had a nice response on Arixtra and has had no other episodes of bleeding. The swelling and pain in his right leg has resolved. He is still wearing his compression stockings daily.  His most recent US showed no evidence of DVT in the left leg and a partially occlusive thrombus in the distal right femoral vein, popliteal vein extending to the tibioperoneal trunk. He is making sure to stay well hydrated. He has maintained a good appetite. No swelling or tenderness in is extremities. No numbness or tingling.  He has cut back on smoking and is down to 2 cigarettes/week. He is using jolly ranchers and a vapor to try and stop completely.  He is not sleeping well. We discussed him speaking to his PCP about a sleep apnea test but he is not interested in doing that at this time.  No fever, chills, n/v, cough, rash, dizziness, changes in vision, headache, SOB, chest pain, palpitations, abdominal pain or changes in bowel or bladder habits.  Medications:    Medication List       This list is accurate as of: 04/23/15  2:44 PM.  Always use your most recent med list.               fondaparinux 7.5 MG/0.6ML Soln injection  Commonly known as:  ARIXTRA  Inject 0.6 mLs (7.5 mg total) into the skin daily.     omeprazole 20 MG capsule  Commonly known as:  PRILOSEC  Take 20 mg by mouth daily.     VENTOLIN HFA 108 (90 Base) MCG/ACT  inhaler  Generic drug:  albuterol  as needed. Reported on 03/24/2015        Allergies: No Known Allergies  Past Medical History, Surgical history, Social history, and Family History were reviewed and updated.  Review of Systems: All other 10 point review of systems is negative.   Physical Exam:  vitals were not taken for this visit.  Wt Readings from Last 3 Encounters:  03/24/15 166 lb 9.6 oz (75.569 kg)  03/20/15 165 lb (74.844 kg)  01/16/15 175 lb (79.379 kg)    Ocular: Sclerae unicteric, pupils equal, round and reactive to light Ear-nose-throat: Oropharynx clear, dentition fair Lymphatic: No cervical supraclavicular or axillary adenopathy Lungs no rales or rhonchi, good excursion bilaterally Heart regular rate and rhythm, no murmur appreciated Abd soft, nontender, positive bowel sounds, no liver or spleen tip palpated on exam, no fluid wave MSK no focal spinal tenderness, no joint edema Neuro: non-focal, well-oriented, appropriate affect Breasts: Deferred  Lab Results  Component Value Date   WBC 5.3 03/20/2015   HGB 17.0 03/20/2015   HCT 48.8 03/20/2015   MCV 100* 03/20/2015   PLT 171 03/20/2015   Lab Results  Component Value Date   FERRITIN 11* 03/20/2015   IRON 31* 03/20/2015   TIBC 453* 03/20/2015   UIBC 421* 03/20/2015   IRONPCTSAT 7* 03/20/2015   Lab Results  Component Value Date   RBC 4.88 03/20/2015   No results found for: KPAFRELGTCHN, LAMBDASER, KAPLAMBRATIO No results found for: IGGSERUM, IGA, IGMSERUM No results found for: Marda Stalker, SPEI   Chemistry      Component Value Date/Time   NA 141 03/20/2015 1459   NA 146* 02/13/2015 1304   NA 142 01/10/2015 0319   K 3.6 03/20/2015 1459   K 4.6 02/13/2015 1304   K 3.8 01/10/2015 0319   CL 104 03/20/2015 1459   CL 107 01/10/2015 0319   CO2 29 03/20/2015 1459   CO2 30* 02/13/2015 1304   CO2 29 01/10/2015 0319   BUN 10 03/20/2015 1459    BUN 10.1 02/13/2015 1304   BUN 7 01/10/2015 0319   CREATININE 0.7 03/20/2015 1459   CREATININE 1.1 02/13/2015 1304   CREATININE 1.15 01/10/2015 0319      Component Value Date/Time   CALCIUM 8.4 03/20/2015 1459   CALCIUM 9.4 02/13/2015 1304   CALCIUM 8.6* 01/10/2015 0319   ALKPHOS 44 03/20/2015 1459   ALKPHOS 71 02/13/2015 1304   ALKPHOS 91 01/07/2015 1319   AST 34 03/20/2015 1459   AST 27 02/13/2015 1304   AST 25 01/07/2015 1319   ALT 36 03/20/2015 1459   ALT 22 02/13/2015 1304   ALT 26 01/07/2015 1319   BILITOT 0.70 03/20/2015 1459   BILITOT 1.16 02/13/2015 1304   BILITOT 2.2* 01/07/2015 1319     Impression and Plan: Aaron Cunningham is a very pleasant 50 yo white male with recurrent bilateral lower extremity DVTs. He currently has an IVC filter in place. He is now on lifelong anticoagulation with Arixtra. He has had a nice response and is asymptomatic at this time.  His Hgb is 16.7 with an MCV 96. He is asymptomatic with this so we will hold off on phlebotomizing him for now.  We will plan to see him back in 6 weeks for lab work, follow-up and repeat his [place] at that time.  He will contact us with any questions or concerns. We can certainly see him sooner if need be.    Verdie Mosher, NP 4/6/20172:44 PM

## 2015-05-08 ENCOUNTER — Encounter: Payer: Self-pay | Admitting: Hematology & Oncology

## 2015-06-05 ENCOUNTER — Other Ambulatory Visit: Payer: BC Managed Care – PPO

## 2015-06-05 ENCOUNTER — Ambulatory Visit: Payer: BC Managed Care – PPO | Admitting: Family

## 2015-06-08 ENCOUNTER — Ambulatory Visit (HOSPITAL_BASED_OUTPATIENT_CLINIC_OR_DEPARTMENT_OTHER): Payer: BC Managed Care – PPO | Admitting: Family

## 2015-06-08 ENCOUNTER — Ambulatory Visit (HOSPITAL_BASED_OUTPATIENT_CLINIC_OR_DEPARTMENT_OTHER)
Admission: RE | Admit: 2015-06-08 | Discharge: 2015-06-08 | Disposition: A | Discharge status: Home / Self Care | Payer: BC Managed Care – PPO | Source: Ambulatory Visit | Attending: Family | Admitting: Family

## 2015-06-08 ENCOUNTER — Other Ambulatory Visit (HOSPITAL_BASED_OUTPATIENT_CLINIC_OR_DEPARTMENT_OTHER): Payer: BC Managed Care – PPO

## 2015-06-08 ENCOUNTER — Encounter: Payer: Self-pay | Admitting: Family

## 2015-06-08 VITALS — BP 155/89 | HR 71 | Temp 98.1°F | Resp 16 | Ht 69.0 in | Wt 171.0 lb

## 2015-06-08 DIAGNOSIS — Z7901 Long term (current) use of anticoagulants: Secondary | ICD-10-CM

## 2015-06-08 DIAGNOSIS — R936 Abnormal findings on diagnostic imaging of limbs: Secondary | ICD-10-CM | POA: Insufficient documentation

## 2015-06-08 DIAGNOSIS — I824Z2 Acute embolism and thrombosis of unspecified deep veins of left distal lower extremity: Secondary | ICD-10-CM

## 2015-06-08 DIAGNOSIS — Z86718 Personal history of other venous thrombosis and embolism: Secondary | ICD-10-CM | POA: Insufficient documentation

## 2015-06-08 DIAGNOSIS — I82531 Chronic embolism and thrombosis of right popliteal vein: Secondary | ICD-10-CM | POA: Diagnosis not present

## 2015-06-08 DIAGNOSIS — I82413 Acute embolism and thrombosis of femoral vein, bilateral: Secondary | ICD-10-CM

## 2015-06-08 DIAGNOSIS — F17210 Nicotine dependence, cigarettes, uncomplicated: Secondary | ICD-10-CM | POA: Diagnosis not present

## 2015-06-08 LAB — PROTIME-INR (CHCC SATELLITE)
INR: 1 — ABNORMAL LOW (ref 2.0–3.5)
PROTIME: 12 s (ref 10.6–13.4)

## 2015-06-08 LAB — CBC WITH DIFFERENTIAL (CANCER CENTER ONLY)
BASO#: 0 10*3/uL (ref 0.0–0.2)
BASO%: 0.1 % (ref 0.0–2.0)
EOS ABS: 0.1 10*3/uL (ref 0.0–0.5)
EOS%: 1.7 % (ref 0.0–7.0)
HCT: 48.7 % (ref 38.7–49.9)
HGB: 17 g/dL (ref 13.0–17.1)
LYMPH#: 2.1 10*3/uL (ref 0.9–3.3)
LYMPH%: 27.8 % (ref 14.0–48.0)
MCH: 32.6 pg (ref 28.0–33.4)
MCHC: 34.9 g/dL (ref 32.0–35.9)
MCV: 93 fL (ref 82–98)
MONO#: 0.6 10*3/uL (ref 0.1–0.9)
MONO%: 7.9 % (ref 0.0–13.0)
NEUT#: 4.8 10*3/uL (ref 1.5–6.5)
NEUT%: 62.5 % (ref 40.0–80.0)
PLATELETS: 188 10*3/uL (ref 145–400)
RBC: 5.22 10*6/uL (ref 4.20–5.70)
RDW: 14.8 % (ref 11.1–15.7)
WBC: 7.6 10*3/uL (ref 4.0–10.0)

## 2015-06-08 LAB — COMPREHENSIVE METABOLIC PANEL (CC13)
A/G RATIO: 2 (ref 1.2–2.2)
ALBUMIN: 4.5 g/dL (ref 3.5–5.5)
ALK PHOS: 64 IU/L (ref 39–117)
ALT: 20 IU/L (ref 0–44)
AST (SGOT): 28 IU/L (ref 0–40)
BILIRUBIN TOTAL: 0.6 mg/dL (ref 0.0–1.2)
BUN / CREAT RATIO: 10 (ref 9–20)
BUN: 10 mg/dL (ref 6–24)
CHLORIDE: 101 mmol/L (ref 96–106)
CREATININE: 1.05 mg/dL (ref 0.76–1.27)
Calcium, Ser: 9.1 mg/dL (ref 8.7–10.2)
Carbon Dioxide, Total: 26 mmol/L (ref 18–29)
GFR calc Af Amer: 96 mL/min/{1.73_m2} (ref >59)
GFR calc non Af Amer: 83 mL/min/{1.73_m2} (ref >59)
GLOBULIN, TOTAL: 2.2 g/dL (ref 1.5–4.5)
Glucose: 99 mg/dL (ref 65–99)
Potassium, Ser: 4 mmol/L (ref 3.5–5.2)
SODIUM: 137 mmol/L (ref 134–144)
Total Protein: 6.7 g/dL (ref 6.0–8.5)

## 2015-06-08 NOTE — Progress Notes (Signed)
Hematology and Oncology Follow Up Visit  Aaron Cunningham [id number] [birth year] 50 y.o. 06/08/2015   Principle Diagnosis:  1. 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 History of DVT right lower extremity and PE  Current Therapy:   1. S/p Thrombolysis through bilateral lower extremity infusion catheters 2. Arixtra 7.5 mg SQ daily *Lifelong anticoagulation* 3. IVC filter in place    Interim History:  Aaron Cunningham is here today for a follow-up. He is still doing well. He has had no swelling or pain in his legs. He continues to wear his compression stocking daily while at work. 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 He is doing well on Arixtra. No episodes of bleeding or bruising.  No tenderness, numbness or tingling in his extremities.  This was he and his kids first mother's day without his wife and this was hard for them. He has been smoking a little more often since then. 6 cigarettes/day. He is really trying to quit.  His Hct is 48.7 today. He likes to donate with the [facility] periodically and plans to go soon.  No fever, chills, n/v, cough, rash, dizziness, changes in vision, headache, SOB, chest pain, palpitations, abdominal pain or changes in bowel or bladder habits. His appetite comes and goes. He is making sure to stay well hydrated. His weight is unchanged.   Medications:    Medication List       This list is accurate as of: 06/08/15  3:15 PM.  Always use your most recent med list.               fondaparinux 7.5 MG/0.6ML Soln injection  Commonly known as:  ARIXTRA  Inject 0.6 mLs (7.5 mg total) into the skin daily.     omeprazole 20 MG capsule  Commonly known as:  PRILOSEC  Take 20 mg by mouth daily.     VENTOLIN HFA 108 (90 Base) MCG/ACT inhaler  Generic drug:  albuterol  as needed. Reported on 03/24/2015         Allergies: No Known Allergies  Past Medical History, Surgical history, Social history, and Family History were reviewed and updated.  Review of Systems: All other 10 point review of systems is negative.   Physical Exam:  vitals were not taken for this visit.  Wt Readings from Last 3 Encounters:  04/23/15 170 lb (77.111 kg)  03/24/15 166 lb 9.6 oz (75.569 kg)  03/20/15 165 lb (74.844 kg)    Ocular: Sclerae unicteric, pupils equal, round and reactive to light Ear-nose-throat: Oropharynx clear, dentition fair Lymphatic: No cervical supraclavicular or axillary adenopathy Lungs no rales or rhonchi, good excursion bilaterally Heart regular rate and rhythm, no murmur appreciated Abd soft, nontender, positive bowel sounds, no liver or spleen tip palpated on exam, no fluid wave MSK no focal spinal tenderness, no joint edema Neuro: non-focal, well-oriented, appropriate affect Breasts: Deferred  Lab Results  Component Value Date   WBC 5.8 04/23/2015   HGB 16.7 04/23/2015   HCT 48.3 04/23/2015   MCV 96 04/23/2015   PLT 229 04/23/2015   Lab Results  Component Value Date   FERRITIN 11* 03/20/2015   IRON 31* 03/20/2015   TIBC 453* 03/20/2015   UIBC 421* 03/20/2015   IRONPCTSAT 7* 03/20/2015   Lab Results  Component Value Date   RBC 5.05 04/23/2015   No results found for: KPAFRELGTCHN,  LAMBDASER, KAPLAMBRATIO No results found for: IGGSERUM, IGA, IGMSERUM No results found for: Georgann Housekeeper, MSPIKE, SPEI   Chemistry      Component Value Date/Time   NA 134 04/23/2015 1433   NA 141 03/20/2015 1459   NA 146* 02/13/2015 1304   NA 142 01/10/2015 0319   K 4.6 04/23/2015 1433   K 3.6 03/20/2015 1459   K 4.6 02/13/2015 1304   K 3.8 01/10/2015 0319   CL 101 04/23/2015 1433   CL 104 03/20/2015 1459   CL 107 01/10/2015 0319   CO2 24 04/23/2015 1433   CO2 29 03/20/2015 1459   CO2 30* 02/13/2015 1304   CO2 29 01/10/2015 0319    BUN 16 04/23/2015 1433   BUN 10 03/20/2015 1459   BUN 10.1 02/13/2015 1304   BUN 7 01/10/2015 0319   CREATININE 1.08 04/23/2015 1433   CREATININE 0.7 03/20/2015 1459   CREATININE 1.1 02/13/2015 1304   CREATININE 1.15 01/10/2015 0319      Component Value Date/Time   CALCIUM 9.5 04/23/2015 1433   CALCIUM 8.4 03/20/2015 1459   CALCIUM 9.4 02/13/2015 1304   CALCIUM 8.6* 01/10/2015 0319   ALKPHOS 58 04/23/2015 1433   ALKPHOS 44 03/20/2015 1459   ALKPHOS 71 02/13/2015 1304   ALKPHOS 91 01/07/2015 1319   AST 23 04/23/2015 1433   AST 34 03/20/2015 1459   AST 27 02/13/2015 1304   AST 25 01/07/2015 1319   ALT 24 04/23/2015 1433   ALT 36 03/20/2015 1459   ALT 22 02/13/2015 1304   ALT 26 01/07/2015 1319   BILITOT 0.4 04/23/2015 1433   BILITOT 0.70 03/20/2015 1459   BILITOT 1.16 02/13/2015 1304   BILITOT 2.2* 01/07/2015 1319     Impression and Plan: Aaron Cunningham is a very pleasant 50 yo white male with recurrent bilateral lower extremity DVTs. He currently has an IVC filter in place and is on lifelong anticoagulation with Arixtra. He has had a nice response and is asymptomatic at this time. 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 He will continue on his same dose of Arixtra.  His Hct at this time is 48.7. He prefers to donate with the [facility] and plans to go soon. He is still trying to quit smoking.  We will plan to see him back in 2 months for lab work and follow-up. He will contact us with any questions or concerns. We can certainly see him sooner if need be.    Verdie Mosher, NP 5/22/20173:15 PM

## 2015-07-06 ENCOUNTER — Other Ambulatory Visit: Payer: Self-pay | Admitting: Hematology & Oncology

## 2015-07-28 ENCOUNTER — Ambulatory Visit (HOSPITAL_BASED_OUTPATIENT_CLINIC_OR_DEPARTMENT_OTHER)
Admission: RE | Admit: 2015-07-28 | Discharge: 2015-07-28 | Disposition: A | Discharge status: Home / Self Care | Payer: BC Managed Care – PPO | Source: Ambulatory Visit | Attending: Radiology | Admitting: Radiology

## 2015-07-28 DIAGNOSIS — I82531 Chronic embolism and thrombosis of right popliteal vein: Secondary | ICD-10-CM | POA: Insufficient documentation

## 2015-07-28 DIAGNOSIS — I80203 Phlebitis and thrombophlebitis of unspecified deep vessels of lower extremities, bilateral: Secondary | ICD-10-CM

## 2015-07-28 DIAGNOSIS — I82511 Chronic embolism and thrombosis of right femoral vein: Secondary | ICD-10-CM | POA: Insufficient documentation

## 2015-07-28 DIAGNOSIS — I82409 Acute embolism and thrombosis of unspecified deep veins of unspecified lower extremity: Secondary | ICD-10-CM | POA: Diagnosis not present

## 2015-08-07 ENCOUNTER — Ambulatory Visit (HOSPITAL_BASED_OUTPATIENT_CLINIC_OR_DEPARTMENT_OTHER): Payer: BC Managed Care – PPO | Admitting: Family

## 2015-08-07 ENCOUNTER — Encounter: Payer: Self-pay | Admitting: Family

## 2015-08-07 ENCOUNTER — Other Ambulatory Visit (HOSPITAL_BASED_OUTPATIENT_CLINIC_OR_DEPARTMENT_OTHER): Payer: BC Managed Care – PPO

## 2015-08-07 VITALS — BP 170/95 | HR 69 | Temp 98.2°F | Resp 18 | Ht 69.0 in | Wt 163.0 lb

## 2015-08-07 DIAGNOSIS — I82403 Acute embolism and thrombosis of unspecified deep veins of lower extremity, bilateral: Secondary | ICD-10-CM

## 2015-08-07 DIAGNOSIS — I824Z2 Acute embolism and thrombosis of unspecified deep veins of left distal lower extremity: Secondary | ICD-10-CM

## 2015-08-07 DIAGNOSIS — Z7901 Long term (current) use of anticoagulants: Secondary | ICD-10-CM | POA: Diagnosis not present

## 2015-08-07 DIAGNOSIS — I2692 Saddle embolus of pulmonary artery without acute cor pulmonale: Secondary | ICD-10-CM

## 2015-08-07 DIAGNOSIS — I82413 Acute embolism and thrombosis of femoral vein, bilateral: Secondary | ICD-10-CM

## 2015-08-07 LAB — COMPREHENSIVE METABOLIC PANEL (CC13)
A/G RATIO: 1.9 (ref 1.2–2.2)
ALT: 29 IU/L (ref 0–44)
AST: 24 IU/L (ref 0–40)
Albumin, Serum: 4.3 g/dL (ref 3.5–5.5)
Alkaline Phosphatase, S: 73 IU/L (ref 39–117)
BILIRUBIN TOTAL: 0.5 mg/dL (ref 0.0–1.2)
BUN/Creatinine Ratio: 8 — ABNORMAL LOW (ref 9–20)
BUN: 8 mg/dL (ref 6–24)
CHLORIDE: 101 mmol/L (ref 96–106)
Calcium, Ser: 9 mg/dL (ref 8.7–10.2)
Carbon Dioxide, Total: 28 mmol/L (ref 18–29)
Creatinine, Ser: 1.03 mg/dL (ref 0.76–1.27)
GFR calc non Af Amer: 84 mL/min/{1.73_m2} (ref >59)
GFR, EST AFRICAN AMERICAN: 97 mL/min/{1.73_m2} (ref >59)
GLOBULIN, TOTAL: 2.3 g/dL (ref 1.5–4.5)
Glucose: 108 mg/dL — ABNORMAL HIGH (ref 65–99)
POTASSIUM: 3.9 mmol/L (ref 3.5–5.2)
SODIUM: 139 mmol/L (ref 134–144)
Total Protein: 6.6 g/dL (ref 6.0–8.5)

## 2015-08-07 LAB — CBC WITH DIFFERENTIAL (CANCER CENTER ONLY)
BASO#: 0 10*3/uL (ref 0.0–0.2)
BASO%: 0.2 % (ref 0.0–2.0)
EOS ABS: 0.1 10*3/uL (ref 0.0–0.5)
EOS%: 2 % (ref 0.0–7.0)
HEMATOCRIT: 49.9 % (ref 38.7–49.9)
HGB: 17.2 g/dL — ABNORMAL HIGH (ref 13.0–17.1)
LYMPH#: 1.4 10*3/uL (ref 0.9–3.3)
LYMPH%: 25.1 % (ref 14.0–48.0)
MCH: 33.5 pg — AB (ref 28.0–33.4)
MCHC: 34.5 g/dL (ref 32.0–35.9)
MCV: 97 fL (ref 82–98)
MONO#: 0.5 10*3/uL (ref 0.1–0.9)
MONO%: 9.6 % (ref 0.0–13.0)
NEUT#: 3.5 10*3/uL (ref 1.5–6.5)
NEUT%: 63.1 % (ref 40.0–80.0)
PLATELETS: 201 10*3/uL (ref 145–400)
RBC: 5.14 10*6/uL (ref 4.20–5.70)
RDW: 14.1 % (ref 11.1–15.7)
WBC: 5.5 10*3/uL (ref 4.0–10.0)

## 2015-08-07 NOTE — Progress Notes (Signed)
Hematology and Oncology Follow Up Visit  Aaron Cunningham [id number] 10-May-1965 50 y.o. 08/07/2015   Principle Diagnosis:  1. 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 History of DVT right lower extremity and PE  Current Therapy:   1. S/p Thrombolysis through bilateral lower extremity infusion catheters 2. Arixtra 7.5 mg SQ daily *Lifelong anticoagulation* 3. IVC filter in place    Interim History:  [name] is here today for a follow-up. He is doing well but still experiencing some post phlebitic pain in the right lower extremity at times. This improves with wearing a compression stocking. His has no swelling, numbness or tingling in his extremities.   He prefers to continue on Arixtra since he has not experienced any bleeding, bruising or petechiae.  No fever, chills, n/v, cough, rash, dizziness, changes in vision, headache, SOB, chest pain, palpitations, abdominal pain or changes in bowel or bladder habits. He is still smoking 6 cigarettes a day. He plans to quite on July 25th. His appetite still comes and goes. He is making sure to stay well hydrated.   Medications:    Medication List       This list is accurate as of: 08/07/15  3:39 PM.  Always use your most recent med list.               fondaparinux 7.5 MG/0.6ML Soln injection  Commonly known as:  ARIXTRA  Inject 0.6 mLs (7.5 mg total) into the skin daily.     omeprazole 20 MG capsule  Commonly known as:  PRILOSEC  Take 20 mg by mouth daily.     tamsulosin 0.4 MG Caps capsule  Commonly known as:  FLOMAX  Take 0.4 mg by mouth daily.        Allergies: No Known Allergies  Past Medical History, Surgical history, Social history, and Family History were reviewed and updated.  Review of Systems: All other 10 point review of systems is negative.   Physical Exam:  height is 5\' 9"  (1.753 m) and weight is 163 lb (73.936 kg). His oral  temperature is 98.2 F (36.8 C). His blood pressure is 170/95 and his pulse is 69. His respiration is 18.   Wt Readings from Last 3 Encounters:  08/07/15 163 lb (73.936 kg)  06/08/15 171 lb (77.565 kg)  04/23/15 170 lb (77.111 kg)    Ocular: Sclerae unicteric, pupils equal, round and reactive to light Ear-nose-throat: Oropharynx clear, dentition fair Lymphatic: No cervical supraclavicular or axillary adenopathy Lungs no rales or rhonchi, good excursion bilaterally Heart regular rate and rhythm, no murmur appreciated Abd soft, nontender, positive bowel sounds, no liver or spleen tip palpated on exam, no fluid wave MSK no focal spinal tenderness, no joint edema Neuro: non-focal, well-oriented, appropriate affect Breasts: Deferred  Lab Results  Component Value Date   WBC 5.5 08/07/2015   HGB 17.2* 08/07/2015   HCT 49.9 08/07/2015   MCV 97 08/07/2015   PLT 201 08/07/2015   Lab Results  Component Value Date   FERRITIN 11* 03/20/2015   IRON 31* 03/20/2015   TIBC 453* 03/20/2015   UIBC 421* 03/20/2015   IRONPCTSAT 7* 03/20/2015   Lab Results  Component Value Date   RBC 5.14 08/07/2015   No results found for: KPAFRELGTCHN, LAMBDASER, KAPLAMBRATIO No results found for: IGGSERUM, IGA, IGMSERUM No results found for: TOTALPROTELP, ALBUMINELP, A1GS, A2GS, BETS, BETA2SER, GAMS, MSPIKE, SPEI   Chemistry  Component Value Date/Time   NA 137 06/08/2015 1531   NA 141 03/20/2015 1459   NA 146* 02/13/2015 1304   NA 142 01/10/2015 0319   K 4.0 06/08/2015 1531   K 3.6 03/20/2015 1459   K 4.6 02/13/2015 1304   K 3.8 01/10/2015 0319   CL 101 06/08/2015 1531   CL 104 03/20/2015 1459   CL 107 01/10/2015 0319   CO2 26 06/08/2015 1531   CO2 29 03/20/2015 1459   CO2 30* 02/13/2015 1304   CO2 29 01/10/2015 0319   BUN 10 06/08/2015 1531   BUN 10 03/20/2015 1459   BUN 10.1 02/13/2015 1304   BUN 7 01/10/2015 0319   CREATININE 1.05 06/08/2015 1531   CREATININE 0.7 03/20/2015 1459    CREATININE 1.1 02/13/2015 1304   CREATININE 1.15 01/10/2015 0319      Component Value Date/Time   CALCIUM 9.1 06/08/2015 1531   CALCIUM 8.4 03/20/2015 1459   CALCIUM 9.4 02/13/2015 1304   CALCIUM 8.6* 01/10/2015 0319   ALKPHOS 64 06/08/2015 1531   ALKPHOS 44 03/20/2015 1459   ALKPHOS 71 02/13/2015 1304   ALKPHOS 91 01/07/2015 1319   AST 28 06/08/2015 1531   AST 34 03/20/2015 1459   AST 27 02/13/2015 1304   AST 25 01/07/2015 1319   ALT 20 06/08/2015 1531   ALT 36 03/20/2015 1459   ALT 22 02/13/2015 1304   ALT 26 01/07/2015 1319   BILITOT 0.6 06/08/2015 1531   BILITOT 0.70 03/20/2015 1459   BILITOT 1.16 02/13/2015 1304   BILITOT 2.2* 01/07/2015 1319     Impression and Plan: [name] is a very pleasant 51 yo white male with recurrent bilateral lower extremity DVTs. He currently has an IVC filter in place and is on lifelong anticoagulation with Arixtra. He prefers to remain on Arixtra rather than switching to an oral anticoagulant. He has had a nice response and so far there have been no other recurrent thrombus.        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 He wears compression stockings daily which helps.  We will plan to see him back in 3 months for lab work and follow-up. He will contact us with any questions or concerns. We can certainly see him sooner if need be.    Aaron Mosher, NP 7/21/20173:39 PM

## 2015-08-27 ENCOUNTER — Ambulatory Visit: Payer: BC Managed Care – PPO

## 2015-08-27 ENCOUNTER — Other Ambulatory Visit: Payer: Self-pay | Admitting: Family

## 2015-08-27 VITALS — BP 141/86 | HR 81 | Temp 97.9°F | Resp 18

## 2015-08-27 DIAGNOSIS — D582 Other hemoglobinopathies: Secondary | ICD-10-CM

## 2015-08-27 NOTE — Progress Notes (Unsigned)
Aaron Shearerharles Scott Spark presents today for phlebotomy per MD orders. Phlebotomy procedure started at 1422 and ended at 1430 500 grams removed. Patient observed for 30 minutes after procedure without any incident. Patient tolerated procedure well. Nourishments provided.  IV needle removed intact.

## 2015-08-27 NOTE — Patient Instructions (Signed)
Therapeutic Phlebotomy, Care After  Refer to this sheet in the next few weeks. These instructions provide you with information about caring for yourself after your procedure. Your health care provider may also give you more specific instructions. Your treatment has been planned according to current medical practices, but problems sometimes occur. Call your health care provider if you have any problems or questions after your procedure.  WHAT TO EXPECT AFTER THE PROCEDURE  After your procedure, it is common to have:   Light-headedness or dizziness. You may feel faint.   Nausea.   Tiredness.  HOME CARE INSTRUCTIONS  Activities   Return to your normal activities as directed by your health care provider. Most people can go back to their normal activities right away.   Avoid strenuous physical activity and heavy lifting or pulling for about 5 hours after the procedure. Do not lift anything that is heavier than 10 lb (4.5 kg).   Athletes should avoid strenuous exercise for at least 12 hours.   Change positions slowly for the remainder of the day. This will help to prevent light-headedness or fainting.   If you feel light-headed, lie down until the feeling goes away.  Eating and Drinking   Be sure to eat well-balanced meals for the next 24 hours.   Drink enough fluid to keep your urine clear or pale yellow.   Avoid drinking alcohol on the day that you had the procedure.  Care of the Needle Insertion Site   Keep your bandage dry. You can remove the bandage after about 5 hours or as directed by your health care provider.   If you have bleeding from the needle insertion site, elevate your arm and press firmly on the site until the bleeding stops.   If you have bruising at the site, apply ice to the area:   Put ice in a plastic bag.   Place a towel between your skin and the bag.   Leave the ice on for 20 minutes, 2-3 times a day for the first 24 hours.   If the swelling does not go away after 24 hours, apply  a warm, moist washcloth to the area for 20 minutes, 2-3 times a day.  General Instructions   Avoid smoking for at least 30 minutes after the procedure.   Keep all follow-up visits as directed by your health care provider. It is important to continue with further therapeutic phlebotomy treatments as directed.  SEEK MEDICAL CARE IF:   You have redness, swelling, or pain at the needle insertion site.   You have fluid, blood, or pus coming from the needle insertion site.   You feel light-headed, dizzy, or nauseated, and the feeling does not go away.   You notice new bruising at the needle insertion site.   You feel weaker than normal.   You have a fever or chills.  SEEK IMMEDIATE MEDICAL CARE IF:   You have severe nausea or vomiting.   You have chest pain.   You have trouble breathing.    This information is not intended to replace advice given to you by your health care provider. Make sure you discuss any questions you have with your health care provider.    Document Released: 06/07/2010 Document Revised: 05/20/2014 Document Reviewed: 12/30/2013  [employer] Interactive Patient Education 2016 [employer]

## 2015-09-08 ENCOUNTER — Other Ambulatory Visit: Payer: Self-pay | Admitting: Family

## 2015-09-08 DIAGNOSIS — R339 Retention of urine, unspecified: Secondary | ICD-10-CM

## 2015-09-08 DIAGNOSIS — I82409 Acute embolism and thrombosis of unspecified deep veins of unspecified lower extremity: Secondary | ICD-10-CM

## 2015-09-28 DIAGNOSIS — F411 Generalized anxiety disorder: Secondary | ICD-10-CM

## 2015-09-28 HISTORY — DX: Generalized anxiety disorder: F41.1

## 2015-10-08 ENCOUNTER — Other Ambulatory Visit: Payer: Self-pay | Admitting: Hematology & Oncology

## 2015-10-08 DIAGNOSIS — I82409 Acute embolism and thrombosis of unspecified deep veins of unspecified lower extremity: Secondary | ICD-10-CM

## 2015-10-18 HISTORY — PX: CHOLECYSTECTOMY: SHX55

## 2015-11-02 DIAGNOSIS — F10939 Alcohol use, unspecified with withdrawal, unspecified: Secondary | ICD-10-CM

## 2015-11-02 DIAGNOSIS — N200 Calculus of kidney: Secondary | ICD-10-CM | POA: Insufficient documentation

## 2015-11-02 DIAGNOSIS — N189 Chronic kidney disease, unspecified: Secondary | ICD-10-CM

## 2015-11-02 DIAGNOSIS — Z86718 Personal history of other venous thrombosis and embolism: Secondary | ICD-10-CM | POA: Insufficient documentation

## 2015-11-02 DIAGNOSIS — N179 Acute kidney failure, unspecified: Secondary | ICD-10-CM | POA: Insufficient documentation

## 2015-11-02 DIAGNOSIS — K219 Gastro-esophageal reflux disease without esophagitis: Secondary | ICD-10-CM | POA: Insufficient documentation

## 2015-11-02 DIAGNOSIS — F1091 Alcohol use, unspecified, in remission: Secondary | ICD-10-CM | POA: Insufficient documentation

## 2015-11-02 DIAGNOSIS — Z86711 Personal history of pulmonary embolism: Secondary | ICD-10-CM | POA: Insufficient documentation

## 2015-11-02 DIAGNOSIS — F10239 Alcohol dependence with withdrawal, unspecified: Secondary | ICD-10-CM | POA: Insufficient documentation

## 2015-11-02 HISTORY — DX: Chronic kidney disease, unspecified: N18.9

## 2015-11-02 HISTORY — DX: Calculus of kidney: N20.0

## 2015-11-02 HISTORY — DX: Alcohol use, unspecified with withdrawal, unspecified: F10.939

## 2015-11-06 ENCOUNTER — Ambulatory Visit (HOSPITAL_BASED_OUTPATIENT_CLINIC_OR_DEPARTMENT_OTHER): Payer: BC Managed Care – PPO | Admitting: Family

## 2015-11-06 ENCOUNTER — Encounter: Payer: Self-pay | Admitting: Family

## 2015-11-06 ENCOUNTER — Other Ambulatory Visit (HOSPITAL_BASED_OUTPATIENT_CLINIC_OR_DEPARTMENT_OTHER): Payer: BC Managed Care – PPO

## 2015-11-06 VITALS — BP 142/91 | HR 101 | Temp 98.3°F | Resp 16 | Ht 69.0 in | Wt 163.4 lb

## 2015-11-06 DIAGNOSIS — I82513 Chronic embolism and thrombosis of femoral vein, bilateral: Secondary | ICD-10-CM

## 2015-11-06 DIAGNOSIS — Z86711 Personal history of pulmonary embolism: Secondary | ICD-10-CM

## 2015-11-06 DIAGNOSIS — I82511 Chronic embolism and thrombosis of right femoral vein: Secondary | ICD-10-CM

## 2015-11-06 DIAGNOSIS — I82413 Acute embolism and thrombosis of femoral vein, bilateral: Secondary | ICD-10-CM

## 2015-11-06 DIAGNOSIS — I82531 Chronic embolism and thrombosis of right popliteal vein: Secondary | ICD-10-CM | POA: Diagnosis not present

## 2015-11-06 DIAGNOSIS — I824Z2 Acute embolism and thrombosis of unspecified deep veins of left distal lower extremity: Secondary | ICD-10-CM

## 2015-11-06 DIAGNOSIS — Z79811 Long term (current) use of aromatase inhibitors: Secondary | ICD-10-CM | POA: Diagnosis not present

## 2015-11-06 DIAGNOSIS — I2692 Saddle embolus of pulmonary artery without acute cor pulmonale: Secondary | ICD-10-CM

## 2015-11-06 LAB — COMPREHENSIVE METABOLIC PANEL
ALT: 28 U/L (ref 0–55)
AST: 35 U/L — AB (ref 5–34)
Albumin: 3.9 g/dL (ref 3.5–5.0)
Alkaline Phosphatase: 109 U/L (ref 40–150)
Anion Gap: 13 mEq/L — ABNORMAL HIGH (ref 3–11)
BUN: 10.4 mg/dL (ref 7.0–26.0)
CALCIUM: 9.3 mg/dL (ref 8.4–10.4)
CHLORIDE: 102 meq/L (ref 98–109)
CO2: 26 meq/L (ref 22–29)
CREATININE: 1 mg/dL (ref 0.7–1.3)
EGFR: >90 mL/min/{1.73_m2} (ref >90)
Glucose: 132 mg/dl (ref 70–140)
POTASSIUM: 4.3 meq/L (ref 3.5–5.1)
SODIUM: 141 meq/L (ref 136–145)
Total Bilirubin: 0.81 mg/dL (ref 0.20–1.20)
Total Protein: 7 g/dL (ref 6.4–8.3)

## 2015-11-06 LAB — CBC WITH DIFFERENTIAL (CANCER CENTER ONLY)
BASO#: 0 10*3/uL (ref 0.0–0.2)
BASO%: 0 % (ref 0.0–2.0)
EOS%: 3.5 % (ref 0.0–7.0)
Eosinophils Absolute: 0.1 10*3/uL (ref 0.0–0.5)
HCT: 46.9 % (ref 38.7–49.9)
HGB: 16.7 g/dL (ref 13.0–17.1)
LYMPH#: 1.6 10*3/uL (ref 0.9–3.3)
LYMPH%: 40 % (ref 14.0–48.0)
MCH: 33.9 pg — ABNORMAL HIGH (ref 28.0–33.4)
MCHC: 35.6 g/dL (ref 32.0–35.9)
MCV: 95 fL (ref 82–98)
MONO#: 0.3 10*3/uL (ref 0.1–0.9)
MONO%: 7.8 % (ref 0.0–13.0)
NEUT#: 1.9 10*3/uL (ref 1.5–6.5)
NEUT%: 48.7 % (ref 40.0–80.0)
PLATELETS: 136 10*3/uL — AB (ref 145–400)
RBC: 4.92 10*6/uL (ref 4.20–5.70)
RDW: 13.9 % (ref 11.1–15.7)
WBC: 4 10*3/uL (ref 4.0–10.0)

## 2015-11-06 NOTE — Progress Notes (Signed)
Hematology and Oncology Follow Up Visit  Aaron Cunningham [id number] 01-Oct-1965 50 y.o. 11/06/2015   Principle Diagnosis:  1. 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 History of DVT right lower extremity and PE  Current Therapy:   1. S/p Thrombolysis through bilateral lower extremity infusion catheters 2. Arixtra 7.5 mg SQ daily - prefers this to oral anticoagulation, history of GI bleed on oral  *Lifelong anticoagulation* 3. IVC filter in place    Interim History:  Aaron Cunningham is here today for a follow-up. He is doing well and feeling better. He had an episode on Monday where his submandibular glands were swollen. He went to see his PCP and is on Augmentin at this time. His exam today was negative for lymphadenopathy. They felt that he had had an allergic reaction to something but did not determine what.  He continues to do well on Arixtra and has had no issues with bleeding or bruising. He wears compression stockings daily. His post phlebitic pain is improved.  He has cut back on his smoking to less than 1 cigarette a day. He uses a vape sometimes with 3% nicotine but is really trying to quit completely.  No fever, chills, n/v, cough, rash, dizziness, changes in vision, headache, SOB, chest pain, palpitations, abdominal pain or changes in bowel or bladder habits. His has no swelling, numbness or tingling in his extremities.   His appetite still comes and goes. He is making sure to stay well hydrated. His weight is unchanged.   Medications:    Medication List       Accurate as of 11/06/15  3:17 PM. Always use your most recent med list.          amoxicillin-clavulanate 875-125 MG tablet Commonly known as:  AUGMENTIN Take by mouth.   fondaparinux 7.5 MG/0.6ML Soln injection Commonly known as:  Games developer THE CONTENTS OF ONE SYRINGE SUBCUTANEOUSLY ONCE DAILY   omeprazole 20 MG  capsule Commonly known as:  PRILOSEC Take 20 mg by mouth daily.   tamsulosin 0.4 MG Caps capsule Commonly known as:  FLOMAX Take 0.4 mg by mouth daily.   tamsulosin 0.4 MG Caps capsule Commonly known as:  FLOMAX TAKE ONE CAPSULE BY MOUTH ONCE DAILY       Allergies: No Known Allergies  Past Medical History, Surgical history, Social history, and Family History were reviewed and updated.  Review of Systems: All other 10 point review of systems is negative.   Physical Exam:  height is 5\' 9"  (1.753 m) and weight is 163 lb 6.4 oz (74.1 kg). His oral temperature is 98.3 F (36.8 C). His blood pressure is 142/91 (abnormal) and his pulse is 101 (abnormal). His respiration is 16.   Wt Readings from Last 3 Encounters:  11/06/15 163 lb 6.4 oz (74.1 kg)  08/07/15 163 lb (73.9 kg)  06/08/15 171 lb (77.6 kg)    Ocular: Sclerae unicteric, pupils equal, round and reactive to light Ear-nose-throat: Oropharynx clear, dentition fair Lymphatic: No cervical supraclavicular or axillary adenopathy Lungs no rales or rhonchi, good excursion bilaterally Heart regular rate and rhythm, no murmur appreciated Abd soft, nontender, positive bowel sounds, no liver or spleen tip palpated on exam, no fluid wave MSK no focal spinal tenderness, no joint edema Neuro: non-focal, well-oriented, appropriate affect Breasts: Deferred  Lab Results  Component Value Date   WBC 4.0 11/06/2015   HGB 16.7 11/06/2015   HCT 46.9 11/06/2015  MCV 95 11/06/2015   PLT 136 (L) 11/06/2015   Lab Results  Component Value Date   FERRITIN 11 (L) 03/20/2015   IRON 31 (L) 03/20/2015   TIBC 453 (H) 03/20/2015   UIBC 421 (H) 03/20/2015   IRONPCTSAT 7 (L) 03/20/2015   Lab Results  Component Value Date   RBC 4.92 11/06/2015   No results found for: KPAFRELGTCHN, LAMBDASER, KAPLAMBRATIO No results found for: IGGSERUM, IGA, IGMSERUM No results found for: Marda StalkerOTALPROTELP, ALBUMINELP, A1GS, A2GS, BETS, BETA2SER, GAMS, MSPIKE,  SPEI   Chemistry      Component Value Date/Time   NA 139 08/07/2015 1520   NA 141 03/20/2015 1459   NA 146 (H) 02/13/2015 1304   K 3.9 08/07/2015 1520   K 3.6 03/20/2015 1459   K 4.6 02/13/2015 1304   CL 101 08/07/2015 1520   CL 104 03/20/2015 1459   CO2 28 08/07/2015 1520   CO2 29 03/20/2015 1459   CO2 30 (H) 02/13/2015 1304   BUN 8 08/07/2015 1520   BUN 10 03/20/2015 1459   BUN 10.1 02/13/2015 1304   CREATININE 1.03 08/07/2015 1520   CREATININE 0.7 03/20/2015 1459   CREATININE 1.1 02/13/2015 1304      Component Value Date/Time   CALCIUM 9.0 08/07/2015 1520   CALCIUM 8.4 03/20/2015 1459   CALCIUM 9.4 02/13/2015 1304   ALKPHOS 73 08/07/2015 1520   ALKPHOS 44 03/20/2015 1459   ALKPHOS 71 02/13/2015 1304   AST 24 08/07/2015 1520   AST 34 03/20/2015 1459   AST 27 02/13/2015 1304   ALT 29 08/07/2015 1520   ALT 36 03/20/2015 1459   ALT 22 02/13/2015 1304   BILITOT 0.5 08/07/2015 1520   BILITOT 0.70 03/20/2015 1459   BILITOT 1.16 02/13/2015 1304     Impression and Plan: Aaron Cunningham is a very pleasant 50 yo white male with recurrent bilateral lower extremity DVTs. IVC filter is in place and he is on lifelong anticoagulation with Arixtra. He has done well and has had no recurrent thrombus. 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 He seems to be doing better, his postphlebitic pain has improved.  He has almost quit smoking completely and this appears to be helping with his counts. His Hgb is down to 16.7. No phlebotomy needed at this time.  We will plan to see him back in 3 months for lab work and follow-up. He will contact us with any questions or concerns. We can certainly see him sooner if need be.    Verdie MosherINCINNATI,SARAH M, NP 10/20/20173:17 PM

## 2015-11-13 ENCOUNTER — Other Ambulatory Visit: Payer: BC Managed Care – PPO

## 2015-11-13 ENCOUNTER — Ambulatory Visit: Payer: BC Managed Care – PPO | Admitting: Family

## 2016-02-05 ENCOUNTER — Ambulatory Visit: Payer: BC Managed Care – PPO | Admitting: Family

## 2016-02-05 ENCOUNTER — Other Ambulatory Visit: Payer: BC Managed Care – PPO

## 2016-02-19 ENCOUNTER — Ambulatory Visit (HOSPITAL_BASED_OUTPATIENT_CLINIC_OR_DEPARTMENT_OTHER): Payer: BC Managed Care – PPO | Admitting: Family

## 2016-02-19 ENCOUNTER — Other Ambulatory Visit (HOSPITAL_BASED_OUTPATIENT_CLINIC_OR_DEPARTMENT_OTHER): Payer: BC Managed Care – PPO

## 2016-02-19 ENCOUNTER — Encounter: Payer: Self-pay | Admitting: *Deleted

## 2016-02-19 ENCOUNTER — Ambulatory Visit (HOSPITAL_BASED_OUTPATIENT_CLINIC_OR_DEPARTMENT_OTHER): Payer: BC Managed Care – PPO

## 2016-02-19 VITALS — BP 123/86 | HR 92 | Temp 98.6°F | Wt 160.0 lb

## 2016-02-19 VITALS — BP 123/77 | HR 89 | Resp 17

## 2016-02-19 DIAGNOSIS — I82513 Chronic embolism and thrombosis of femoral vein, bilateral: Secondary | ICD-10-CM

## 2016-02-19 DIAGNOSIS — D751 Secondary polycythemia: Secondary | ICD-10-CM

## 2016-02-19 DIAGNOSIS — Z86711 Personal history of pulmonary embolism: Secondary | ICD-10-CM

## 2016-02-19 DIAGNOSIS — I82503 Chronic embolism and thrombosis of unspecified deep veins of lower extremity, bilateral: Secondary | ICD-10-CM

## 2016-02-19 DIAGNOSIS — Z720 Tobacco use: Secondary | ICD-10-CM

## 2016-02-19 DIAGNOSIS — Z7901 Long term (current) use of anticoagulants: Secondary | ICD-10-CM

## 2016-02-19 DIAGNOSIS — D582 Other hemoglobinopathies: Secondary | ICD-10-CM

## 2016-02-19 LAB — CBC WITH DIFFERENTIAL (CANCER CENTER ONLY)
BASO#: 0 10*3/uL (ref 0.0–0.2)
BASO%: 0.2 % (ref 0.0–2.0)
EOS%: 3.4 % (ref 0.0–7.0)
Eosinophils Absolute: 0.2 10*3/uL (ref 0.0–0.5)
HCT: 50.8 % — ABNORMAL HIGH (ref 38.7–49.9)
HEMOGLOBIN: 18 g/dL — AB (ref 13.0–17.1)
LYMPH#: 1.1 10*3/uL (ref 0.9–3.3)
LYMPH%: 24.7 % (ref 14.0–48.0)
MCH: 35.1 pg — ABNORMAL HIGH (ref 28.0–33.4)
MCHC: 35.4 g/dL (ref 32.0–35.9)
MCV: 99 fL — ABNORMAL HIGH (ref 82–98)
MONO#: 0.5 10*3/uL (ref 0.1–0.9)
MONO%: 10.7 % (ref 0.0–13.0)
NEUT%: 61 % (ref 40.0–80.0)
NEUTROS ABS: 2.7 10*3/uL (ref 1.5–6.5)
Platelets: 137 10*3/uL — ABNORMAL LOW (ref 145–400)
RBC: 5.13 10*6/uL (ref 4.20–5.70)
RDW: 13.4 % (ref 11.1–15.7)
WBC: 4.4 10*3/uL (ref 4.0–10.0)

## 2016-02-19 LAB — COMPREHENSIVE METABOLIC PANEL
ALBUMIN: 3.6 g/dL (ref 3.5–5.0)
ALK PHOS: 122 U/L (ref 40–150)
ALT: 47 U/L (ref 0–55)
AST: 51 U/L — AB (ref 5–34)
Anion Gap: 10 mEq/L (ref 3–11)
BUN: 8.3 mg/dL (ref 7.0–26.0)
CALCIUM: 9 mg/dL (ref 8.4–10.4)
CO2: 28 mEq/L (ref 22–29)
CREATININE: 1 mg/dL (ref 0.7–1.3)
Chloride: 104 mEq/L (ref 98–109)
EGFR: 86 mL/min/{1.73_m2} — ABNORMAL LOW (ref >90)
GLUCOSE: 109 mg/dL (ref 70–140)
POTASSIUM: 3.9 meq/L (ref 3.5–5.1)
SODIUM: 143 meq/L (ref 136–145)
TOTAL PROTEIN: 6.5 g/dL (ref 6.4–8.3)
Total Bilirubin: 0.69 mg/dL (ref 0.20–1.20)

## 2016-02-19 NOTE — Progress Notes (Signed)
Hematology and Oncology Follow Up Visit  Aaron Cunningham [id number] [birth year] 50 y.o. 02/19/2016   Principle Diagnosis:  1. 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 History of DVT right lower extremity and PE  Current Therapy:   1. S/p Thrombolysis through bilateral lower extremity infusion catheters 2. Arixtra 7.5 mg SQ daily - prefers this to oral anticoagulation, history of GI bleed on oral  *Lifelong anticoagulation* 3. IVC filter in place    Interim History:  Aaron Cunningham is here today for a follow-up. He is feeling a bit tired and having some post phlebitic pain in his right lower extremity. He has no swelling and pedal pulses are +2.  He continues to do well on Arixtra and has had no issues with bleeding or bruising. He wears compression stockings daily. He does ask for a note for his work (who purchases his working boots for him) to provide him with high top (8 inch 511 boots) for the support. This is certainly a reasonable request with him having the chronic thrombus and post phlebitic pain. His Hgb today is elevated at 18.0 with a Hct of 50.8. His complexion is [place], he has a metallic taste in his mouth, and he states that he keeps a headache. His BP and HR are stable. He admits that he is smoking again but plans to quit.   He is still under a good bit of stress at home. He now has custody of his two grandchildren and has switched jobs so he has better hours.  He has a cough with the smoking and occasionally has brown sputum expectorated.  No fever, chills, n/v, rash, dizziness, changes in vision, headache, SOB, chest pain, palpitations, abdominal pain or changes in bowel or bladder habits. He has IBS and fluctuates between constipation and diarrhea since having his gallbladder removed.  His has no swelling, numbness or tingling in his extremities.   His appetite still comes and goes. He is making sure  to stay well hydrated. His weight is stable.   Medications:  Allergies as of 02/19/2016   No Known Allergies     Medication List       Accurate as of 02/19/16 10:45 AM. Always use your most recent med list.          fondaparinux 7.5 MG/0.6ML Soln injection Commonly known as:  ARIXTRA INJECT THE CONTENTS OF ONE SYRINGE SUBCUTANEOUSLY ONCE DAILY   omeprazole 20 MG capsule Commonly known as:  PRILOSEC Take 20 mg by mouth daily.   tamsulosin 0.4 MG Caps capsule Commonly known as:  FLOMAX Take 0.4 mg by mouth daily.       Allergies: No Known Allergies  Past Medical History, Surgical history, Social history, and Family History were reviewed and updated.  Review of Systems: All other 10 point review of systems is negative.   Physical Exam:  weight is 160 lb (72.6 kg). His oral temperature is 98.6 F (37 C). His blood pressure is 123/86 and his pulse is 92.   Wt Readings from Last 3 Encounters:  02/19/16 160 lb (72.6 kg)  11/06/15 163 lb 6.4 oz (74.1 kg)  08/07/15 163 lb (73.9 kg)    Ocular: Sclerae unicteric, pupils equal, round and reactive to light Ear-nose-throat: Oropharynx clear, dentition fair Lymphatic: No cervical supraclavicular or axillary adenopathy Lungs no rales or rhonchi, good excursion bilaterally Heart regular rate and rhythm, no murmur appreciated Abd soft, nontender, positive bowel  sounds, no liver or spleen tip palpated on exam, no fluid wave MSK no focal spinal tenderness, no joint edema Neuro: non-focal, well-oriented, appropriate affect Breasts: Deferred  Lab Results  Component Value Date   WBC 4.4 02/19/2016   HGB 18.0 (H) 02/19/2016   HCT 50.8 (H) 02/19/2016   MCV 99 (H) 02/19/2016   PLT 137 (L) 02/19/2016   Lab Results  Component Value Date   FERRITIN 11 (L) 03/20/2015   IRON 31 (L) 03/20/2015   TIBC 453 (H) 03/20/2015   UIBC 421 (H) 03/20/2015   IRONPCTSAT 7 (L) 03/20/2015   Lab Results  Component Value Date   RBC 5.13  02/19/2016   No results found for: KPAFRELGTCHN, LAMBDASER, KAPLAMBRATIO No results found for: IGGSERUM, IGA, IGMSERUM No results found for: Marda Stalker, SPEI   Chemistry      Component Value Date/Time   NA 141 11/06/2015 1406   K 4.3 11/06/2015 1406   CL 101 08/07/2015 1520   CL 104 03/20/2015 1459   CO2 26 11/06/2015 1406   BUN 10.4 11/06/2015 1406   CREATININE 1.0 11/06/2015 1406      Component Value Date/Time   CALCIUM 9.3 11/06/2015 1406   ALKPHOS 109 11/06/2015 1406   AST 35 (H) 11/06/2015 1406   ALT 28 11/06/2015 1406   BILITOT 0.81 11/06/2015 1406     Impression and Plan: Aaron Cunningham is a very pleasant 51 yo white male with recurrent bilateral lower extremity DVTs. IVC filter is in place and he is on lifelong anticoagulation with Arixtra. He has done well and has had no recurrent thrombus. 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 With this he has periodic flares of postphlebitic pain.  He wears compression stockings regularly and stays well hydrated.  Unfortunately, he is smoking again and his Hct has climbed back up to 50.8. He is symptomatic with headache, ruddy complexion and metallic taste in his mouth. We will go ahead and phlebotomize him today and get his counts down. Hopefully this will help with his symptoms and possibly even his leg pain. He will contact us if this persists and dose not improve.  Note was given to patient recommending he use the 8 inch high top boots at work.  We will plan to see him back in 6 weeks for lab work and follow-up. He will contact us with any questions or concerns. We can certainly see him sooner if need be.    Verdie Mosher, NP 2/2/201810:45 AM

## 2016-02-19 NOTE — Progress Notes (Signed)
Aaron Cunningham presents today for phlebotomy per MD orders. Phlebotomy procedure started at 1056 and ended at 1105 533 grams removed via 16G to RAC Patient declined to stay for observation period, feels well, drinking beverage and VS taken. Patient tolerated procedure well.

## 2016-03-18 ENCOUNTER — Other Ambulatory Visit: Payer: Self-pay | Admitting: Family

## 2016-03-18 ENCOUNTER — Other Ambulatory Visit: Payer: Self-pay | Admitting: Hematology & Oncology

## 2016-03-18 DIAGNOSIS — I82409 Acute embolism and thrombosis of unspecified deep veins of unspecified lower extremity: Secondary | ICD-10-CM

## 2016-03-18 DIAGNOSIS — R339 Retention of urine, unspecified: Secondary | ICD-10-CM

## 2016-03-19 ENCOUNTER — Other Ambulatory Visit: Payer: Self-pay | Admitting: Family

## 2016-03-19 DIAGNOSIS — I82413 Acute embolism and thrombosis of femoral vein, bilateral: Secondary | ICD-10-CM

## 2016-03-19 DIAGNOSIS — I824Z2 Acute embolism and thrombosis of unspecified deep veins of left distal lower extremity: Secondary | ICD-10-CM

## 2016-03-19 DIAGNOSIS — I2692 Saddle embolus of pulmonary artery without acute cor pulmonale: Secondary | ICD-10-CM

## 2016-03-19 MED ORDER — TAMSULOSIN HCL 0.4 MG PO CAPS: 0.4000 mg | ORAL_CAPSULE | Freq: Every day | ORAL | 3 refills | Status: DC

## 2016-03-28 ENCOUNTER — Encounter (HOSPITAL_COMMUNITY): Payer: BC Managed Care – PPO

## 2016-03-28 ENCOUNTER — Ambulatory Visit: Payer: BC Managed Care – PPO | Admitting: Family

## 2016-04-01 ENCOUNTER — Other Ambulatory Visit (HOSPITAL_BASED_OUTPATIENT_CLINIC_OR_DEPARTMENT_OTHER): Payer: BC Managed Care – PPO

## 2016-04-01 ENCOUNTER — Ambulatory Visit (HOSPITAL_BASED_OUTPATIENT_CLINIC_OR_DEPARTMENT_OTHER): Payer: BC Managed Care – PPO | Admitting: Hematology & Oncology

## 2016-04-01 ENCOUNTER — Encounter: Payer: Self-pay | Admitting: Hematology & Oncology

## 2016-04-01 ENCOUNTER — Ambulatory Visit (HOSPITAL_BASED_OUTPATIENT_CLINIC_OR_DEPARTMENT_OTHER): Payer: BC Managed Care – PPO

## 2016-04-01 VITALS — BP 154/96 | HR 86 | Temp 98.2°F | Resp 18 | Wt 156.0 lb

## 2016-04-01 VITALS — BP 148/96 | HR 88

## 2016-04-01 DIAGNOSIS — Z7901 Long term (current) use of anticoagulants: Secondary | ICD-10-CM

## 2016-04-01 DIAGNOSIS — I82513 Chronic embolism and thrombosis of femoral vein, bilateral: Secondary | ICD-10-CM

## 2016-04-01 DIAGNOSIS — I824Z2 Acute embolism and thrombosis of unspecified deep veins of left distal lower extremity: Secondary | ICD-10-CM

## 2016-04-01 DIAGNOSIS — I82403 Acute embolism and thrombosis of unspecified deep veins of lower extremity, bilateral: Secondary | ICD-10-CM | POA: Diagnosis not present

## 2016-04-01 DIAGNOSIS — D751 Secondary polycythemia: Secondary | ICD-10-CM

## 2016-04-01 DIAGNOSIS — D582 Other hemoglobinopathies: Secondary | ICD-10-CM

## 2016-04-01 LAB — COMPREHENSIVE METABOLIC PANEL (CC13)
A/G RATIO: 1.6 (ref 1.2–2.2)
ALT: 43 IU/L (ref 0–44)
AST: 45 IU/L — AB (ref 0–40)
Albumin, Serum: 4.3 g/dL (ref 3.5–5.5)
Alkaline Phosphatase, S: 128 IU/L — ABNORMAL HIGH (ref 39–117)
BUN/Creatinine Ratio: 8 — ABNORMAL LOW (ref 9–20)
BUN: 8 mg/dL (ref 6–24)
Bilirubin Total: 0.8 mg/dL (ref 0.0–1.2)
CALCIUM: 9.4 mg/dL (ref 8.7–10.2)
CO2: 29 mmol/L (ref 18–29)
CREATININE: 1.03 mg/dL (ref 0.76–1.27)
Chloride, Ser: 102 mmol/L (ref 96–106)
GFR calc Af Amer: 97 mL/min/{1.73_m2} (ref >59)
GFR, EST NON AFRICAN AMERICAN: 84 mL/min/{1.73_m2} (ref >59)
Globulin, Total: 2.7 g/dL (ref 1.5–4.5)
Glucose: 94 mg/dL (ref 65–99)
POTASSIUM: 4.9 mmol/L (ref 3.5–5.2)
Sodium: 143 mmol/L (ref 134–144)
Total Protein: 7 g/dL (ref 6.0–8.5)

## 2016-04-01 LAB — CBC WITH DIFFERENTIAL (CANCER CENTER ONLY)
BASO#: 0 10*3/uL (ref 0.0–0.2)
BASO%: 0.3 % (ref 0.0–2.0)
EOS%: 4.5 % (ref 0.0–7.0)
Eosinophils Absolute: 0.1 10*3/uL (ref 0.0–0.5)
HCT: 50.7 % — ABNORMAL HIGH (ref 38.7–49.9)
HGB: 17.6 g/dL — ABNORMAL HIGH (ref 13.0–17.1)
LYMPH#: 1.3 10*3/uL (ref 0.9–3.3)
LYMPH%: 41.3 % (ref 14.0–48.0)
MCH: 34.5 pg — ABNORMAL HIGH (ref 28.0–33.4)
MCHC: 34.7 g/dL (ref 32.0–35.9)
MCV: 99 fL — ABNORMAL HIGH (ref 82–98)
MONO#: 0.3 10*3/uL (ref 0.1–0.9)
MONO%: 9 % (ref 0.0–13.0)
NEUT#: 1.4 10*3/uL — ABNORMAL LOW (ref 1.5–6.5)
NEUT%: 44.9 % (ref 40.0–80.0)
PLATELETS: 157 10*3/uL (ref 145–400)
RBC: 5.1 10*6/uL (ref 4.20–5.70)
RDW: 13.2 % (ref 11.1–15.7)
WBC: 3.1 10*3/uL — ABNORMAL LOW (ref 4.0–10.0)

## 2016-04-01 MED ORDER — FOLIC ACID 1 MG PO TABS: 1.0000 mg | ORAL_TABLET | Freq: Every day | ORAL | 6 refills | Status: DC

## 2016-04-01 NOTE — Progress Notes (Signed)
Aaron Shearerharles Scott Zeiss presents today for phlebotomy per MD orders. Phlebotomy procedure started at 1400 and ended at 1415. 500 grams removed. Patient observed for 30 minutes after procedure without any incident. Patient tolerated procedure well. IV needle removed intact.

## 2016-04-01 NOTE — Progress Notes (Signed)
Hematology and Oncology Follow Up Visit  Aaron Cunningham [id number] 11/15/1965 50 y.o. 04/01/2016   Principle Diagnosis:  1. 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 History of DVT right lower extremity and PE 3. 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 S/p Thrombolysis through bilateral lower extremity infusion catheters 2. Arixtra 7.5 mg SQ daily - prefers this to oral anticoagulation, history of GI bleed on oral --  lifelong 3. Phlebotomy to maintain hematocrit below 45%     Interim History:  Aaron Cunningham is here today for a follow-up. He is feeling a bit tired. He is still working. He does smoke about half pack of cigarettes per day.   He only got his daughter. She went to her gynecologist. For some reason, the gynecologist checked her daughter for MTHFR and she was homozygous for this. She was told that she had to see a blood doctor. She's never had any type of blood clots. She's never been pregnant and never has had any kind of miscarriages.  Aaron Cunningham like to be tested for this MTHFR mutation. I told him that this has not been implicated with respect to thromboembolic disease. His morning located with cardiac disease. Since he does smoke, it is not a bad idea to check him. 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  I will go ahead and put him on folic acid. I do not see a problem with this. I think 1 mg a day would be reasonable.   He is not complaining of pain in the legs.  Of note, he does have DVT in both legs. He does have an IVC filter.  The last time we checked his legs for thrombo-boxes he was back in 2017.   He is on Arixtra. He is doing well with Arixtra. area and I'll see a problem with this. and having some post phlebitic pain in his right lower extremity.   He has had no cough. He has had no shortness of breath. He has had no rashes.   Overall, his  performance status is ECOG 1.   Medications:  Allergies as of 04/01/2016   No Known Allergies     Medication List       Accurate as of 04/01/16  1:35 PM. Always use your most recent med list.          fondaparinux 7.5 MG/0.6ML Soln injection Commonly known as:  ARIXTRA INJECT THE CONTENTS OF ONE SYRINGE SUBCUTANEOUSLY ONCE DAILY   omeprazole 20 MG capsule Commonly known as:  PRILOSEC Take 20 mg by mouth daily.   tamsulosin 0.4 MG Caps capsule Commonly known as:  FLOMAX Take 1 capsule (0.4 mg total) by mouth daily.       Allergies: No Known Allergies  Past Medical History, Surgical history, Social history, and Family History were reviewed and updated.  Review of Systems: All other 10 point review of systems is negative.   Physical Exam:  weight is 156 lb (70.8 kg). His oral temperature is 98.2 F (36.8 C). His blood pressure is 154/96 (abnormal) and his pulse is 86. His respiration is 18 and oxygen saturation is 99%.   Wt Readings from Last 3 Encounters:  04/01/16 156 lb (70.8 kg)  02/19/16 160 lb (72.6 kg)  11/06/15 163 lb 6.4 oz (74.1 kg)    Well-developed and well-nourished white male. Head and neck exam shows no  ocular or oral lesions. He has no palpable lymph nodes in the neck. Lungs are clear bilaterally. Cardiac exam regular rate and rhythm with no murmurs, rubs or bruits. Abdomen is soft. Has good bowel sounds. There is no fluid wave. There is no palpable liver or spleen tip. Extremities shows no obvious swelling legs. No venous cord is noted in the legs. Has a negative Homan's sign bilaterally. Back exam shows no tenderness over the spine, ribs or hips. Skin exam shows no rashes, ecchymoses or petechia.  Lab Results  Component Value Date   WBC 3.1 (L) 04/01/2016   HGB 17.6 (H) 04/01/2016   HCT 50.7 (H) 04/01/2016   MCV 99 (H) 04/01/2016   PLT 157 04/01/2016   Lab Results  Component Value Date   FERRITIN 11 (L) 03/20/2015   IRON 31 (L) 03/20/2015    TIBC 453 (H) 03/20/2015   UIBC 421 (H) 03/20/2015   IRONPCTSAT 7 (L) 03/20/2015   Lab Results  Component Value Date   RBC 5.10 04/01/2016   No results found for: KPAFRELGTCHN, LAMBDASER, KAPLAMBRATIO No results found for: IGGSERUM, IGA, IGMSERUM No results found for: Marda StalkerOTALPROTELP, ALBUMINELP, A1GS, A2GS, BETS, BETA2SER, GAMS, MSPIKE, SPEI   Chemistry      Component Value Date/Time   NA 143 02/19/2016 0952   K 3.9 02/19/2016 0952   CL 101 08/07/2015 1520   CL 104 03/20/2015 1459   CO2 28 02/19/2016 0952   BUN 8.3 02/19/2016 0952   CREATININE 1.0 02/19/2016 0952      Component Value Date/Time   CALCIUM 9.0 02/19/2016 0952   ALKPHOS 122 02/19/2016 0952   AST 51 (H) 02/19/2016 0952   ALT 47 02/19/2016 0952   BILITOT 0.69 02/19/2016 0952     Impression and Plan: Aaron Cunningham is a very pleasant 51 yo white male with recurrent bilateral lower extremity DVTs. IVC filter is in place and he is on lifelong anticoagulation with Arixtra.   We will go ahead and phlebotomize him. I think we really have to be more aggressive with his phlebotomies. Maybe this will make him feel better. I will like to keep his hematocrit below 45%. As such, we will go ahead and phlebotomize him 3 weeks in a row.   I will see him back in one month. We will see how he feels.  I'll call in the folic acid on the assumption that he does have the MTHFR mutation.   Josph MachoENNEVER,Solenne Manwarren R, MD 3/16/20181:35 PM

## 2016-04-01 NOTE — Patient Instructions (Signed)
Therapeutic Phlebotomy Therapeutic phlebotomy is the controlled removal of blood from a person's body for the purpose of treating a medical condition. The procedure is similar to donating blood. Usually, about a pint (470 mL, or 0.47L) of blood is removed. The average adult has 9-12 pints (4.3-5.7 L) of blood. Therapeutic phlebotomy may be used to treat the following medical conditions:  Hemochromatosis. This is a condition in which the blood contains too much iron.  Polycythemia vera. This is a condition in which the blood contains too many red blood cells.  Porphyria cutanea tarda. This is a disease in which an important part of hemoglobin is not made properly. It results in the buildup of abnormal amounts of porphyrins in the body.  Sickle cell disease. This is a condition in which the red blood cells form an abnormal crescent shape rather than a round shape. Tell a health care provider about:  Any allergies you have.  All medicines you are taking, including vitamins, herbs, eye drops, creams, and over-the-counter medicines.  Any problems you or family members have had with anesthetic medicines.  Any blood disorders you have.  Any surgeries you have had.  Any medical conditions you have. What are the risks? Generally, this is a safe procedure. However, problems may occur, including:  Nausea or light-headedness.  Low blood pressure.  Soreness, bleeding, swelling, or bruising at the needle insertion site.  Infection. What happens before the procedure?  Follow instructions from your health care provider about eating or drinking restrictions.  Ask your health care provider about changing or stopping your regular medicines. This is especially important if you are taking diabetes medicines or blood thinners.  Wear clothing with sleeves that can be raised above the elbow.  Plan to have someone take you home after the procedure.  You may have a blood sample taken. What happens  during the procedure?  A needle will be inserted into one of your veins.  Tubing and a collection bag will be attached to that needle.  Blood will flow through the needle and tubing into the collection bag.  You may be asked to open and close your hand slowly and continually during the entire collection.  After the specified amount of blood has been removed from your body, the collection bag and tubing will be clamped.  The needle will be removed from your vein.  Pressure will be held on the site of the needle insertion to stop the bleeding.  A bandage (dressing) will be placed over the needle insertion site. The procedure may vary among health care providers and hospitals. What happens after the procedure?  Your recovery will be assessed and monitored.  You can return to your normal activities as directed by your health care provider. This information is not intended to replace advice given to you by your health care provider. Make sure you discuss any questions you have with your health care provider. Document Released: 06/07/2010 Document Revised: 09/05/2015 Document Reviewed: 12/30/2013 Elsevier Interactive Patient Education  2017 [employer]  

## 2016-04-07 ENCOUNTER — Ambulatory Visit (HOSPITAL_BASED_OUTPATIENT_CLINIC_OR_DEPARTMENT_OTHER): Payer: BC Managed Care – PPO

## 2016-04-07 VITALS — BP 132/79 | HR 104 | Temp 98.0°F | Resp 18

## 2016-04-07 DIAGNOSIS — D582 Other hemoglobinopathies: Secondary | ICD-10-CM

## 2016-04-07 DIAGNOSIS — D751 Secondary polycythemia: Secondary | ICD-10-CM

## 2016-04-07 NOTE — Patient Instructions (Signed)
Therapeutic Phlebotomy Therapeutic phlebotomy is the controlled removal of blood from a person's body for the purpose of treating a medical condition. The procedure is similar to donating blood. Usually, about a pint (470 mL, or 0.47L) of blood is removed. The average adult has 9-12 pints (4.3-5.7 L) of blood. Therapeutic phlebotomy may be used to treat the following medical conditions:  Hemochromatosis. This is a condition in which the blood contains too much iron.  Polycythemia vera. This is a condition in which the blood contains too many red blood cells.  Porphyria cutanea tarda. This is a disease in which an important part of hemoglobin is not made properly. It results in the buildup of abnormal amounts of porphyrins in the body.  Sickle cell disease. This is a condition in which the red blood cells form an abnormal crescent shape rather than a round shape. Tell a health care provider about:  Any allergies you have.  All medicines you are taking, including vitamins, herbs, eye drops, creams, and over-the-counter medicines.  Any problems you or family members have had with anesthetic medicines.  Any blood disorders you have.  Any surgeries you have had.  Any medical conditions you have. What are the risks? Generally, this is a safe procedure. However, problems may occur, including:  Nausea or light-headedness.  Low blood pressure.  Soreness, bleeding, swelling, or bruising at the needle insertion site.  Infection. What happens before the procedure?  Follow instructions from your health care provider about eating or drinking restrictions.  Ask your health care provider about changing or stopping your regular medicines. This is especially important if you are taking diabetes medicines or blood thinners.  Wear clothing with sleeves that can be raised above the elbow.  Plan to have someone take you home after the procedure.  You may have a blood sample taken. What happens  during the procedure?  A needle will be inserted into one of your veins.  Tubing and a collection bag will be attached to that needle.  Blood will flow through the needle and tubing into the collection bag.  You may be asked to open and close your hand slowly and continually during the entire collection.  After the specified amount of blood has been removed from your body, the collection bag and tubing will be clamped.  The needle will be removed from your vein.  Pressure will be held on the site of the needle insertion to stop the bleeding.  A bandage (dressing) will be placed over the needle insertion site. The procedure may vary among health care providers and hospitals. What happens after the procedure?  Your recovery will be assessed and monitored.  You can return to your normal activities as directed by your health care provider. This information is not intended to replace advice given to you by your health care provider. Make sure you discuss any questions you have with your health care provider. Document Released: 06/07/2010 Document Revised: 09/05/2015 Document Reviewed: 12/30/2013 Elsevier Interactive Patient Education  2017 [employer]  

## 2016-04-07 NOTE — Progress Notes (Signed)
Aaron Shearerharles Scott Tamburri presents today for phlebotomy per MD orders. Phlebotomy procedure started at 1535 and ended at 1545. 500 grams removed. Patient observed for 30 minutes after procedure without any incident. Patient tolerated procedure well. IV needle removed intact.

## 2016-04-15 ENCOUNTER — Ambulatory Visit (HOSPITAL_BASED_OUTPATIENT_CLINIC_OR_DEPARTMENT_OTHER): Payer: BC Managed Care – PPO

## 2016-04-15 VITALS — BP 141/84 | HR 70 | Temp 98.3°F | Resp 18

## 2016-04-15 DIAGNOSIS — D751 Secondary polycythemia: Secondary | ICD-10-CM | POA: Diagnosis not present

## 2016-04-15 DIAGNOSIS — D582 Other hemoglobinopathies: Secondary | ICD-10-CM

## 2016-04-15 MED ORDER — SODIUM CHLORIDE 0.9 % IV SOLN
Freq: Once | INTRAVENOUS | Status: AC
  Administered 2016-04-15: 500 mL via INTRAVENOUS

## 2016-04-15 NOTE — Patient Instructions (Signed)
Therapeutic Phlebotomy Therapeutic phlebotomy is the controlled removal of blood from a person's body for the purpose of treating a medical condition. The procedure is similar to donating blood. Usually, about a pint (470 mL, or 0.47L) of blood is removed. The average adult has 9-12 pints (4.3-5.7 L) of blood. Therapeutic phlebotomy may be used to treat the following medical conditions:  Hemochromatosis. This is a condition in which the blood contains too much iron.  Polycythemia vera. This is a condition in which the blood contains too many red blood cells.  Porphyria cutanea tarda. This is a disease in which an important part of hemoglobin is not made properly. It results in the buildup of abnormal amounts of porphyrins in the body.  Sickle cell disease. This is a condition in which the red blood cells form an abnormal crescent shape rather than a round shape. Tell a health care provider about:  Any allergies you have.  All medicines you are taking, including vitamins, herbs, eye drops, creams, and over-the-counter medicines.  Any problems you or family members have had with anesthetic medicines.  Any blood disorders you have.  Any surgeries you have had.  Any medical conditions you have. What are the risks? Generally, this is a safe procedure. However, problems may occur, including:  Nausea or light-headedness.  Low blood pressure.  Soreness, bleeding, swelling, or bruising at the needle insertion site.  Infection. What happens before the procedure?  Follow instructions from your health care provider about eating or drinking restrictions.  Ask your health care provider about changing or stopping your regular medicines. This is especially important if you are taking diabetes medicines or blood thinners.  Wear clothing with sleeves that can be raised above the elbow.  Plan to have someone take you home after the procedure.  You may have a blood sample taken. What happens  during the procedure?  A needle will be inserted into one of your veins.  Tubing and a collection bag will be attached to that needle.  Blood will flow through the needle and tubing into the collection bag.  You may be asked to open and close your hand slowly and continually during the entire collection.  After the specified amount of blood has been removed from your body, the collection bag and tubing will be clamped.  The needle will be removed from your vein.  Pressure will be held on the site of the needle insertion to stop the bleeding.  A bandage (dressing) will be placed over the needle insertion site. The procedure may vary among health care providers and hospitals. What happens after the procedure?  Your recovery will be assessed and monitored.  You can return to your normal activities as directed by your health care provider. This information is not intended to replace advice given to you by your health care provider. Make sure you discuss any questions you have with your health care provider. Document Released: 06/07/2010 Document Revised: 09/05/2015 Document Reviewed: 12/30/2013 Elsevier Interactive Patient Education  2017 [employer]  

## 2016-04-15 NOTE — Progress Notes (Signed)
Aaron Cunningham presents today for phlebotomy per MD orders. Phlebotomy procedure started at 1420 and ended at 1430. 340 grams removed using 16g phlebotomy kit. Patient became diaphoretic and complained of nausea during phlebotomy. Needle removed. Pt placed in trendelenburg position. Vitals stable as noted. IV started in opposite arm with NS bolus of 500cc.  1438 - Pt reports "feeling fine" now. IV continues to infuse. Nourishments provided.

## 2016-04-18 ENCOUNTER — Ambulatory Visit: Payer: BC Managed Care – PPO | Admitting: Family

## 2016-04-18 ENCOUNTER — Encounter (HOSPITAL_COMMUNITY): Payer: BC Managed Care – PPO

## 2016-05-13 ENCOUNTER — Ambulatory Visit (HOSPITAL_BASED_OUTPATIENT_CLINIC_OR_DEPARTMENT_OTHER): Payer: BC Managed Care – PPO | Admitting: Hematology & Oncology

## 2016-05-13 ENCOUNTER — Other Ambulatory Visit (HOSPITAL_BASED_OUTPATIENT_CLINIC_OR_DEPARTMENT_OTHER): Payer: BC Managed Care – PPO

## 2016-05-13 VITALS — BP 145/89 | HR 82 | Temp 98.2°F | Resp 18 | Wt 158.1 lb

## 2016-05-13 DIAGNOSIS — Z86718 Personal history of other venous thrombosis and embolism: Secondary | ICD-10-CM | POA: Diagnosis not present

## 2016-05-13 DIAGNOSIS — Z7901 Long term (current) use of anticoagulants: Secondary | ICD-10-CM

## 2016-05-13 DIAGNOSIS — R160 Hepatomegaly, not elsewhere classified: Secondary | ICD-10-CM

## 2016-05-13 DIAGNOSIS — D582 Other hemoglobinopathies: Secondary | ICD-10-CM | POA: Diagnosis not present

## 2016-05-13 DIAGNOSIS — D751 Secondary polycythemia: Secondary | ICD-10-CM

## 2016-05-13 DIAGNOSIS — G4701 Insomnia due to medical condition: Secondary | ICD-10-CM

## 2016-05-13 DIAGNOSIS — I824Z2 Acute embolism and thrombosis of unspecified deep veins of left distal lower extremity: Secondary | ICD-10-CM

## 2016-05-13 LAB — CMP (CANCER CENTER ONLY)
ALK PHOS: 94 U/L — AB (ref 26–84)
ALT: 33 U/L (ref 10–47)
AST: 42 U/L — AB (ref 11–38)
Albumin: 3.6 g/dL (ref 3.3–5.5)
BUN: 6 mg/dL — AB (ref 7–22)
CO2: 30 mEq/L (ref 18–33)
CREATININE: 1.2 mg/dL (ref 0.6–1.2)
Calcium: 9.2 mg/dL (ref 8.0–10.3)
Chloride: 102 mEq/L (ref 98–108)
GLUCOSE: 183 mg/dL — AB (ref 73–118)
Potassium: 3.9 mEq/L (ref 3.3–4.7)
SODIUM: 142 meq/L (ref 128–145)
Total Bilirubin: 0.8 mg/dl (ref 0.20–1.60)
Total Protein: 6.5 g/dL (ref 6.4–8.1)

## 2016-05-13 LAB — CBC WITH DIFFERENTIAL (CANCER CENTER ONLY)
BASO#: 0 10*3/uL (ref 0.0–0.2)
BASO%: 0.4 % (ref 0.0–2.0)
EOS ABS: 0.1 10*3/uL (ref 0.0–0.5)
EOS%: 4.1 % (ref 0.0–7.0)
HEMATOCRIT: 43.2 % (ref 38.7–49.9)
HGB: 14.5 g/dL (ref 13.0–17.1)
LYMPH#: 1.1 10*3/uL (ref 0.9–3.3)
LYMPH%: 41.5 % (ref 14.0–48.0)
MCH: 32.4 pg (ref 28.0–33.4)
MCHC: 33.6 g/dL (ref 32.0–35.9)
MCV: 97 fL (ref 82–98)
MONO#: 0.2 10*3/uL (ref 0.1–0.9)
MONO%: 8.1 % (ref 0.0–13.0)
NEUT#: 1.2 10*3/uL — ABNORMAL LOW (ref 1.5–6.5)
NEUT%: 45.9 % (ref 40.0–80.0)
Platelets: 164 10*3/uL (ref 145–400)
RBC: 4.47 10*6/uL (ref 4.20–5.70)
RDW: 12.2 % (ref 11.1–15.7)
WBC: 2.7 10*3/uL — ABNORMAL LOW (ref 4.0–10.0)

## 2016-05-13 MED ORDER — SUVOREXANT 10 MG PO TABS: 10.0000 mg | ORAL_TABLET | Freq: Every evening | ORAL | 0 refills | Status: DC | PRN

## 2016-05-13 NOTE — Progress Notes (Signed)
Hematology and Oncology Follow Up Visit  Aaron Cunningham [id number] September 23, 1965 50 y.o. 05/13/2016   Principle Diagnosis:  1. 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 History of DVT right lower extremity and PE 3. 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 S/p Thrombolysis through bilateral lower extremity infusion catheters 2. Arixtra 7.5 mg SQ daily - prefers this to oral anticoagulation, history of GI bleed on oral --  lifelong 3. Phlebotomy to maintain hematocrit below 45%     Interim History:  Aaron Cunningham is here today for a follow-up. He is feeling a bit tired. He is still working. He does smoke about half pack of cigarettes per day.   We did see Aaron Cunningham. She has never had a blood clot. She has the MTHFR mutation. We put her on folic acid.   He is working quite a bit. He says he is working 110 hours a Cunningham. Obviously, Aaron job is short staffed.   He's had no bleeding. He is doing well on the echo Arixtra.   He's had no fever. He has had no obvious change in bowel or bladder habits.   He's had no leg swelling.   Overall, Aaron performance status is ECOG 1.   Medications:  Allergies as of 05/13/2016   No Known Allergies     Medication List       Accurate as of 05/13/16  3:29 PM. Always use your most recent med list.          folic acid 1 MG tablet Commonly known as:  FOLVITE Take 1 tablet (1 mg total) by mouth daily.   fondaparinux 7.5 MG/0.6ML Soln injection Commonly known as:  Games developer THE CONTENTS OF ONE SYRINGE SUBCUTANEOUSLY ONCE DAILY   omeprazole 20 MG capsule Commonly known as:  PRILOSEC Take 20 mg by mouth daily.   Suvorexant 10 MG Tabs Commonly known as:  BELSOMRA Take 10 mg by mouth at bedtime as needed.   tamsulosin 0.4 MG Caps capsule Commonly known as:  FLOMAX Take 1 capsule (0.4 mg total) by mouth daily.         Allergies: No Known Allergies  Past Medical History, Surgical history, Social history, and Family History were reviewed and updated.  Review of Systems: All other 10 point review of systems is negative.   Physical Exam:  weight is 158 lb 1.9 oz (71.7 kg). Aaron oral temperature is 98.2 F (36.8 C). Aaron blood pressure is 145/89 (abnormal) and Aaron pulse is 82. Aaron respiration is 18 and oxygen saturation is 99%.   Wt Readings from Last 3 Encounters:  05/13/16 158 lb 1.9 oz (71.7 kg)  04/01/16 156 lb (70.8 kg)  02/19/16 160 lb (72.6 kg)    Well-developed and well-nourished white male. Head and neck exam shows no ocular or oral lesions. He has no palpable lymph nodes in the neck. Lungs are clear bilaterally. Cardiac exam regular rate and rhythm with no murmurs, rubs or bruits. Abdomen is soft. Has good bowel sounds. There is no fluid wave. There is no palpable liver or spleen tip. Extremities shows no obvious swelling legs. No venous cord is noted in the legs. Has a negative Homan's sign bilaterally. Back exam shows no tenderness over the spine, ribs or hips. Skin exam shows no rashes, ecchymoses or petechia.  Lab Results  Component Value Date   WBC 2.7 (L)  05/13/2016   HGB 14.5 05/13/2016   HCT 43.2 05/13/2016   MCV 97 05/13/2016   PLT 164 05/13/2016   Lab Results  Component Value Date   FERRITIN 11 (L) 03/20/2015   IRON 31 (L) 03/20/2015   TIBC 453 (H) 03/20/2015   UIBC 421 (H) 03/20/2015   IRONPCTSAT 7 (L) 03/20/2015   Lab Results  Component Value Date   RBC 4.47 05/13/2016   No results found for: KPAFRELGTCHN, LAMBDASER, KAPLAMBRATIO No results found for: IGGSERUM, IGA, IGMSERUM No results found for: Marda Stalker, SPEI   Chemistry      Component Value Date/Time   NA 142 05/13/2016 1319   NA 143 02/19/2016 0952   K 3.9 05/13/2016 1319   K 3.9 02/19/2016 0952   CL 102 05/13/2016 1319   CO2 30 05/13/2016 1319    CO2 28 02/19/2016 0952   BUN 6 (L) 05/13/2016 1319   BUN 8.3 02/19/2016 0952   CREATININE 1.2 05/13/2016 1319   CREATININE 1.0 02/19/2016 0952      Component Value Date/Time   CALCIUM 9.2 05/13/2016 1319   CALCIUM 9.0 02/19/2016 0952   ALKPHOS 94 (H) 05/13/2016 1319   ALKPHOS 122 02/19/2016 0952   AST 42 (H) 05/13/2016 1319   AST 51 (H) 02/19/2016 0952   ALT 33 05/13/2016 1319   ALT 47 02/19/2016 0952   BILITOT 0.80 05/13/2016 1319   BILITOT 0.69 02/19/2016 0952     Impression and Plan: Aaron Cunningham is a very pleasant 51 yo white male with recurrent bilateral lower extremity DVTs. IVC filter is in place and he is on lifelong anticoagulation with Arixtra.   I'm not sure as to why Aaron white cell count going down. I'm not sure as to why he has the elevated liver function test. I think he does drink alcohol. This might be the issue. Aaron liver does feel a little bit plump on exam.  I will order a hepatic ultrasound.   I would like to see him back in about 3-4 weeks for follow-up.  I did give Aaron some BELSOMRA to try to help sleep. He has had a very hard time sleeping ever since Aaron wife passed on.   Aaron Macho, MD 4/27/20183:29 PM

## 2016-05-16 LAB — IRON AND TIBC
%SAT: 9 % — AB (ref 20–55)
IRON: 43 ug/dL (ref 42–163)
TIBC: 479 ug/dL — ABNORMAL HIGH (ref 202–409)
UIBC: 436 ug/dL — AB (ref 117–376)

## 2016-05-16 LAB — FERRITIN: FERRITIN: 10 ng/mL — AB (ref 22–316)

## 2016-05-19 ENCOUNTER — Ambulatory Visit (HOSPITAL_BASED_OUTPATIENT_CLINIC_OR_DEPARTMENT_OTHER)
Admission: RE | Admit: 2016-05-19 | Discharge: 2016-05-19 | Disposition: A | Discharge status: Home / Self Care | Payer: BC Managed Care – PPO | Source: Ambulatory Visit | Attending: Hematology & Oncology | Admitting: Hematology & Oncology

## 2016-05-19 ENCOUNTER — Encounter (HOSPITAL_BASED_OUTPATIENT_CLINIC_OR_DEPARTMENT_OTHER): Payer: Self-pay

## 2016-05-19 DIAGNOSIS — R160 Hepatomegaly, not elsewhere classified: Secondary | ICD-10-CM | POA: Diagnosis present

## 2016-05-19 DIAGNOSIS — K760 Fatty (change of) liver, not elsewhere classified: Secondary | ICD-10-CM | POA: Diagnosis not present

## 2016-05-19 DIAGNOSIS — G4701 Insomnia due to medical condition: Secondary | ICD-10-CM | POA: Diagnosis present

## 2016-05-19 LAB — MTHFR DNA ANALYSIS

## 2016-05-20 ENCOUNTER — Telehealth: Payer: Self-pay | Admitting: *Deleted

## 2016-05-20 NOTE — Telephone Encounter (Addendum)
Patient is aware of results.   ----- Message from Josph MachoPeter R Ennever, MD sent at 05/19/2016  3:10 PM EDT ----- Call - liver is fatty,but no problems with cirrhosis.  You do have the same MTHFR gene as your dgtr - you passed this down to her!!  Aaron Cunningham

## 2016-05-30 ENCOUNTER — Encounter: Payer: Self-pay | Admitting: Family

## 2016-06-02 ENCOUNTER — Encounter: Payer: Self-pay | Admitting: Family

## 2016-06-02 ENCOUNTER — Other Ambulatory Visit (HOSPITAL_BASED_OUTPATIENT_CLINIC_OR_DEPARTMENT_OTHER): Payer: BC Managed Care – PPO

## 2016-06-02 ENCOUNTER — Ambulatory Visit (HOSPITAL_BASED_OUTPATIENT_CLINIC_OR_DEPARTMENT_OTHER): Payer: BC Managed Care – PPO | Admitting: Family

## 2016-06-02 ENCOUNTER — Ambulatory Visit (HOSPITAL_BASED_OUTPATIENT_CLINIC_OR_DEPARTMENT_OTHER): Payer: BC Managed Care – PPO

## 2016-06-02 ENCOUNTER — Ambulatory Visit (HOSPITAL_COMMUNITY)
Admission: RE | Admit: 2016-06-02 | Discharge: 2016-06-02 | Disposition: A | Discharge status: Home / Self Care | Payer: BC Managed Care – PPO | Source: Ambulatory Visit | Attending: Family | Admitting: Family

## 2016-06-02 ENCOUNTER — Ambulatory Visit (INDEPENDENT_AMBULATORY_CARE_PROVIDER_SITE_OTHER): Payer: BC Managed Care – PPO | Admitting: Family

## 2016-06-02 ENCOUNTER — Ambulatory Visit (INDEPENDENT_AMBULATORY_CARE_PROVIDER_SITE_OTHER)
Admission: RE | Admit: 2016-06-02 | Discharge: 2016-06-02 | Disposition: A | Discharge status: Home / Self Care | Payer: BC Managed Care – PPO | Source: Ambulatory Visit | Attending: Family | Admitting: Family

## 2016-06-02 VITALS — BP 142/89 | HR 104 | Temp 98.4°F | Resp 18 | Wt 155.4 lb

## 2016-06-02 VITALS — BP 124/87 | HR 100

## 2016-06-02 VITALS — BP 124/88 | HR 80 | Resp 16 | Ht 68.0 in | Wt 154.2 lb

## 2016-06-02 DIAGNOSIS — R160 Hepatomegaly, not elsewhere classified: Secondary | ICD-10-CM

## 2016-06-02 DIAGNOSIS — I779 Disorder of arteries and arterioles, unspecified: Secondary | ICD-10-CM

## 2016-06-02 DIAGNOSIS — D582 Other hemoglobinopathies: Secondary | ICD-10-CM

## 2016-06-02 DIAGNOSIS — Z720 Tobacco use: Secondary | ICD-10-CM

## 2016-06-02 DIAGNOSIS — Z95828 Presence of other vascular implants and grafts: Secondary | ICD-10-CM | POA: Diagnosis not present

## 2016-06-02 DIAGNOSIS — K760 Fatty (change of) liver, not elsewhere classified: Secondary | ICD-10-CM | POA: Diagnosis not present

## 2016-06-02 DIAGNOSIS — G4701 Insomnia due to medical condition: Secondary | ICD-10-CM

## 2016-06-02 DIAGNOSIS — F17200 Nicotine dependence, unspecified, uncomplicated: Secondary | ICD-10-CM

## 2016-06-02 DIAGNOSIS — Z7901 Long term (current) use of anticoagulants: Secondary | ICD-10-CM | POA: Diagnosis not present

## 2016-06-02 DIAGNOSIS — D751 Secondary polycythemia: Secondary | ICD-10-CM

## 2016-06-02 DIAGNOSIS — I82513 Chronic embolism and thrombosis of femoral vein, bilateral: Secondary | ICD-10-CM

## 2016-06-02 LAB — CMP (CANCER CENTER ONLY)
ALK PHOS: 93 U/L — AB (ref 26–84)
ALT: 30 U/L (ref 10–47)
AST: 34 U/L (ref 11–38)
Albumin: 3.9 g/dL (ref 3.3–5.5)
BILIRUBIN TOTAL: 0.9 mg/dL (ref 0.20–1.60)
BUN: 9 mg/dL (ref 7–22)
CO2: 29 mEq/L (ref 18–33)
CREATININE: 1.2 mg/dL (ref 0.6–1.2)
Calcium: 9.5 mg/dL (ref 8.0–10.3)
Chloride: 100 mEq/L (ref 98–108)
Glucose, Bld: 130 mg/dL — ABNORMAL HIGH (ref 73–118)
Potassium: 4 mEq/L (ref 3.3–4.7)
SODIUM: 141 meq/L (ref 128–145)
TOTAL PROTEIN: 6.9 g/dL (ref 6.4–8.1)

## 2016-06-02 LAB — CBC WITH DIFFERENTIAL (CANCER CENTER ONLY)
BASO#: 0 10*3/uL (ref 0.0–0.2)
BASO%: 0.5 % (ref 0.0–2.0)
EOS%: 3.6 % (ref 0.0–7.0)
Eosinophils Absolute: 0.1 10*3/uL (ref 0.0–0.5)
HCT: 45.4 % (ref 38.7–49.9)
HGB: 15.3 g/dL (ref 13.0–17.1)
LYMPH#: 1.5 10*3/uL (ref 0.9–3.3)
LYMPH%: 38.3 % (ref 14.0–48.0)
MCH: 30.7 pg (ref 28.0–33.4)
MCHC: 33.7 g/dL (ref 32.0–35.9)
MCV: 91 fL (ref 82–98)
MONO#: 0.4 10*3/uL (ref 0.1–0.9)
MONO%: 10.4 % (ref 0.0–13.0)
NEUT%: 47.2 % (ref 40.0–80.0)
NEUTROS ABS: 1.9 10*3/uL (ref 1.5–6.5)
PLATELETS: 191 10*3/uL (ref 145–400)
RBC: 4.99 10*6/uL (ref 4.20–5.70)
RDW: 13.1 % (ref 11.1–15.7)
WBC: 3.9 10*3/uL — AB (ref 4.0–10.0)

## 2016-06-02 LAB — LACTATE DEHYDROGENASE: LDH: 186 U/L (ref 125–245)

## 2016-06-02 NOTE — Patient Instructions (Signed)
Peripheral Vascular Disease Peripheral vascular disease (PVD) is a disease of the blood vessels that are not part of your heart and brain. A simple term for PVD is poor circulation. In most cases, PVD narrows the blood vessels that carry blood from your heart to the rest of your body. This can result in a decreased supply of blood to your arms, legs, and internal organs, like your stomach or kidneys. However, it most often affects a person's lower legs and feet. There are two types of PVD.  Organic PVD. This is the more common type. It is caused by damage to the structure of blood vessels.  Functional PVD. This is caused by conditions that make blood vessels contract and tighten (spasm). Without treatment, PVD tends to get worse over time. PVD can also lead to acute ischemic limb. This is when an arm or limb suddenly has trouble getting enough blood. This is a medical emergency. Follow these instructions at home:  Take medicines only as told by your doctor.  Do not use any tobacco products, including cigarettes, chewing tobacco, or electronic cigarettes. If you need help quitting, ask your doctor.  Lose weight if you are overweight, and maintain a healthy weight as told by your doctor.  Eat a diet that is low in fat and cholesterol. If you need help, ask your doctor.  Exercise regularly. Ask your doctor for some good activities for you.  Take good care of your feet.  Wear comfortable shoes that fit well.  Check your feet often for any cuts or sores. Contact a doctor if:  You have cramps in your legs while walking.  You have leg pain when you are at rest.  You have coldness in a leg or foot.  Your skin changes.  You are unable to get or have an erection (erectile dysfunction).  You have cuts or sores on your feet that are not healing. Get help right away if:  Your arm or leg turns cold and blue.  Your arms or legs become red, warm, swollen, painful, or numb.  You have  chest pain or trouble breathing.  You suddenly have weakness in your face, arm, or leg.  You become very confused or you cannot speak.  You suddenly have a very bad headache.  You suddenly cannot see. This information is not intended to replace advice given to you by your health care provider. Make sure you discuss any questions you have with your health care provider. Document Released: 03/30/2009 Document Revised: 06/11/2015 Document Reviewed: 06/13/2013 Elsevier Interactive Patient Education  2017 Elsevier Inc.      Steps to Quit Smoking Smoking tobacco can be bad for your health. It can also affect almost every organ in your body. Smoking puts you and people around you at risk for many serious long-lasting (chronic) diseases. Quitting smoking is hard, but it is one of the best things that you can do for your health. It is never too late to quit. What are the benefits of quitting smoking? When you quit smoking, you lower your risk for getting serious diseases and conditions. They can include:  Lung cancer or lung disease.  Heart disease.  Stroke.  Heart attack.  Not being able to have children (infertility).  Weak bones (osteoporosis) and broken bones (fractures). If you have coughing, wheezing, and shortness of breath, those symptoms may get better when you quit. You may also get sick less often. If you are pregnant, quitting smoking can help to lower your chances   of having a baby of low birth weight. What can I do to help me quit smoking? Talk with your doctor about what can help you quit smoking. Some things you can do (strategies) include:  Quitting smoking totally, instead of slowly cutting back how much you smoke over a period of time.  Going to in-person counseling. You are more likely to quit if you go to many counseling sessions.  Using resources and support systems, such as:  Online chats with a counselor.  Phone quitlines.  Printed self-help  materials.  Support groups or group counseling.  Text messaging programs.  Mobile phone apps or applications.  Taking medicines. Some of these medicines may have nicotine in them. If you are pregnant or breastfeeding, do not take any medicines to quit smoking unless your doctor says it is okay. Talk with your doctor about counseling or other things that can help you. Talk with your doctor about using more than one strategy at the same time, such as taking medicines while you are also going to in-person counseling. This can help make quitting easier. What things can I do to make it easier to quit? Quitting smoking might feel very hard at first, but there is a lot that you can do to make it easier. Take these steps:  Talk to your family and friends. Ask them to support and encourage you.  Call phone quitlines, reach out to support groups, or work with a counselor.  Ask people who smoke to not smoke around you.  Avoid places that make you want (trigger) to smoke, such as:  Bars.  Parties.  Smoke-break areas at work.  Spend time with people who do not smoke.  Lower the stress in your life. Stress can make you want to smoke. Try these things to help your stress:  Getting regular exercise.  Deep-breathing exercises.  Yoga.  Meditating.  Doing a body scan. To do this, close your eyes, focus on one area of your body at a time from head to toe, and notice which parts of your body are tense. Try to relax the muscles in those areas.  Download or buy apps on your mobile phone or tablet that can help you stick to your quit plan. There are many free apps, such as QuitGuide from the CDC (Centers for Disease Control and Prevention). You can find more support from smokefree.gov and other websites. This information is not intended to replace advice given to you by your health care provider. Make sure you discuss any questions you have with your health care provider. Document Released:  10/30/2008 Document Revised: 09/01/2015 Document Reviewed: 05/20/2014 Elsevier Interactive Patient Education  2017 Elsevier Inc.  

## 2016-06-02 NOTE — Patient Instructions (Signed)
     Therapeutic Phlebotomy, Care After Refer to this sheet in the next few weeks. These instructions provide you with information about caring for yourself after your procedure. Your health care provider may also give you more specific instructions. Your treatment has been planned according to current medical practices, but problems sometimes occur. Call your health care provider if you have any problems or questions after your procedure. What can I expect after the procedure? After the procedure, it is common to have:  Light-headedness or dizziness. You may feel faint.  Nausea.  Tiredness. Follow these instructions at home: Activity  Return to your normal activities as directed by your health care provider. Most people can go back to their normal activities right away.  Avoid strenuous physical activity and heavy lifting or pulling for about 5 hours after the procedure. Do not lift anything that is heavier than 10 lb (4.5 kg).  Athletes should avoid strenuous exercise for at least 12 hours.  Change positions slowly for the remainder of the day. This will help to prevent light-headedness or fainting.  If you feel light-headed, lie down until the feeling goes away. Eating and drinking  Be sure to eat well-balanced meals for the next 24 hours.  Drink enough fluid to keep your urine clear or pale yellow.  Avoid drinking alcohol on the day that you had the procedure. Care of the Needle Insertion Site  Keep your bandage dry. You can remove the bandage after about 5 hours or as directed by your health care provider.  If you have bleeding from the needle insertion site, elevate your arm and press firmly on the site until the bleeding stops.  If you have bruising at the site, apply ice to the area:  Put ice in a plastic bag.  Place a towel between your skin and the bag.  Leave the ice on for 20 minutes, 2-3 times a day for the first 24 hours.  If the swelling does not go away  after 24 hours, apply a warm, moist washcloth to the area for 20 minutes, 2-3 times a day. General instructions  Avoid smoking for at least 30 minutes after the procedure.  Keep all follow-up visits as directed by your health care provider. It is important to continue with further therapeutic phlebotomy treatments as directed. Contact a health care provider if:  You have redness, swelling, or pain at the needle insertion site.  You have fluid, blood, or pus coming from the needle insertion site.  You feel light-headed, dizzy, or nauseated, and the feeling does not go away.  You notice new bruising at the needle insertion site.  You feel weaker than normal.  You have a fever or chills. Get help right away if:  You have severe nausea or vomiting.  You have chest pain.  You have trouble breathing. This information is not intended to replace advice given to you by your health care provider. Make sure you discuss any questions you have with your health care provider. Document Released: 06/07/2010 Document Revised: 09/05/2015 Document Reviewed: 12/30/2013 Elsevier Interactive Patient Education  2017 Elsevier Inc.  

## 2016-06-02 NOTE — Progress Notes (Signed)
Hematology and Oncology Follow Up Visit  Aaron [place] [id number] [birth year] 50 y.o. 06/02/2016   Principle Diagnosis:  1. 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 History of DVT right lower extremity and PE 3. 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 S/p Thrombolysis through bilateral lower extremity infusion catheters 2. Arixtra 7.5 mg SQ daily - prefers this to oral anticoagulation, history of GI bleed on oral - lifelong 3. Phlebotomy to maintain hematocrit below 45%   Interim History:  Aaron Cunningham is here today for follow-up. He is doing well and staying busy with his kids and grand kids. He is working as well.  He is still smoking around 1 ppd on days that he is not at work.  No episodes of bleeding, no bruising or petechiae. No lymphadenopathy.  He has occasional post phlebitic pain in the right lower extremity. No swelling, numbness or tingling in his extremities.   He states that he had bilateral dopplers today to check his IVC filter and blood flow in his legs. He states that he was told it was 100%. The written report is not yet available on epic.  No fever, chills, n/v, cough, rash, dizziness, SOB, chest pain, palpitations, abdominal pain or changes in bowel or bladder habits.  He has maintained a good appetite and is staying hydrated. His weight is stable.  We went over last weeks US result which showed hepatic steatosis. He will try to eat healthier and exercise.   ECOG Performance Status: 1 - Symptomatic but completely ambulatory  Medications:  Allergies as of 06/02/2016   No Known Allergies     Medication List       Accurate as of 06/02/16  1:43 PM. Always use your most recent med list.          folic acid 1 MG tablet Commonly known as:  FOLVITE Take 1 tablet (1 mg total) by mouth daily.   fondaparinux 7.5 MG/0.6ML Soln injection Commonly known as:   Games developerARIXTRA INJECT THE CONTENTS OF ONE SYRINGE SUBCUTANEOUSLY ONCE DAILY   omeprazole 20 MG capsule Commonly known as:  PRILOSEC Take 20 mg by mouth daily.   Suvorexant 10 MG Tabs Commonly known as:  BELSOMRA Take 10 mg by mouth at bedtime as needed.   tamsulosin 0.4 MG Caps capsule Commonly known as:  FLOMAX Take 1 capsule (0.4 mg total) by mouth daily.       Allergies: No Known Allergies  Past Medical History, Surgical history, Social history, and Family History were reviewed and updated.  Review of Systems: All other 10 point review of systems is negative.   Physical Exam:  weight is 155 lb 6.4 oz (70.5 kg). His oral temperature is 98.4 F (36.9 C). His blood pressure is 142/89 (abnormal) and his pulse is 104 (abnormal). His respiration is 18 and oxygen saturation is 98%.   Wt Readings from Last 3 Encounters:  06/02/16 155 lb 6.4 oz (70.5 kg)  06/02/16 154 lb 4 oz (70 kg)  05/13/16 158 lb 1.9 oz (71.7 kg)    Ocular: Sclerae unicteric, pupils equal, round and reactive to light Ear-nose-throat: Oropharynx clear, dentition fair Lymphatic: No cervical, supraclavicular or axillary adenopathy Lungs no rales or rhonchi, good excursion bilaterally Heart regular rate and rhythm, no murmur appreciated Abd soft, nontender, positive bowel sounds, no liver or spleen tip palpated on exam, no fluid wave  MSK no focal  spinal tenderness, no joint edema Neuro: non-focal, well-oriented, appropriate affect Breasts: Deferred  Lab Results  Component Value Date   WBC 3.9 (L) 06/02/2016   HGB 15.3 06/02/2016   HCT 45.4 06/02/2016   MCV 91 06/02/2016   PLT 191 06/02/2016   Lab Results  Component Value Date   FERRITIN 10 (L) 05/13/2016   IRON 43 05/13/2016   TIBC 479 (H) 05/13/2016   UIBC 436 (H) 05/13/2016   IRONPCTSAT 9 (L) 05/13/2016   Lab Results  Component Value Date   RBC 4.99 06/02/2016   No results found for: KPAFRELGTCHN, LAMBDASER, KAPLAMBRATIO No results found for:  IGGSERUM, IGA, IGMSERUM No results found for: Marda Stalker, SPEI   Chemistry      Component Value Date/Time   NA 142 05/13/2016 1319   NA 143 02/19/2016 0952   K 3.9 05/13/2016 1319   K 3.9 02/19/2016 0952   CL 102 05/13/2016 1319   CO2 30 05/13/2016 1319   CO2 28 02/19/2016 0952   BUN 6 (L) 05/13/2016 1319   BUN 8.3 02/19/2016 0952   CREATININE 1.2 05/13/2016 1319   CREATININE 1.0 02/19/2016 0952      Component Value Date/Time   CALCIUM 9.2 05/13/2016 1319   CALCIUM 9.0 02/19/2016 0952   ALKPHOS 94 (H) 05/13/2016 1319   ALKPHOS 122 02/19/2016 0952   AST 42 (H) 05/13/2016 1319   AST 51 (H) 02/19/2016 0952   ALT 33 05/13/2016 1319   ALT 47 02/19/2016 0952   BILITOT 0.80 05/13/2016 1319   BILITOT 0.69 02/19/2016 0952      Impression and Plan: Aaron Cunningham is a pleasant 51 yo caucasian male with history of recurrent bilateral extremity DVT's. He has an IVC filter in place and is on lifelong anticoagulation with Arixtra.  He is doing well on his anticoagulant and will continue on his same regimen. He has had no new issues.  His Hct is 45.3 so we will proceed with phlebotomy today.  We will plan to see him back in 1 month for repeat lab work and follow-up.  He will contact our office with any questions or concerns. We can certainly see him sooner if need be.   Verdie Mosher, NP 5/17/20181:43 PM

## 2016-06-02 NOTE — Progress Notes (Signed)
[facility] [place]   CC: Follow up peripheral artery occlusive disease  History of Present Illness Aaron Cunningham is a 51 y.o. male former patient of Dr. Madilyn FiremanHayes who returns today for evaluation of his PAD. He was lost to follow up for a while. He is s/p right CIA stent with PTA in 2007 and right LE thrombolysis in 2010 by Dr. Hart RochesterLawson. Review of records: February 2014 ABI's were in the normal range with bi and triphasic waveforms, digit pressures were normal range. He walks as part of his job most of his day, 7 days/week, very little sitting. He denies non healing wounds.   Pt denies any history of stroke or TIA.  He was treated for kidney stones in [place], legs started swelling, he was found here to have bilateral lower legs DVT, states he did not have kidney stones In June or July of 2016 he had a PE, he had a venous filter placed. Pt states he is under evaluation by a hematologist to see why he is having venous clotting issues, abnormalities in WBC's and RBC's.  He had a cholecystectomy in the Fall of 2017 for cholecystitis.   Pt Diabetic: No Pt smoker: smoker (1/3 ppd, started at age 51 yrs)  Pt meds include: Statin :No  ASA: no, stopped due to hematology issue Other anticoagulants/antiplatelets: Arixtra started for DVT in December 2016   Past Medical History:  Diagnosis Date  . Anxiety   . Bronchiolitis Feb. 2016  . DVT (deep venous thrombosis) (HCC)   . GERD (gastroesophageal reflux disease)   . Hyperlipidemia   . Peripheral vascular disease (HCC)   . Pneumonia Feb. 2016  . PVD (peripheral vascular disease) (HCC) 01/09/2015   Right iliac stent 2007    Social History Social History  Substance Use Topics  . Smoking status: Light Tobacco Smoker    Packs/day: 0.50    Years: 30.00    Types: Cigarettes  . Smokeless tobacco: Never Used     Comment: Vapor  . Alcohol use 0.6 oz/week    1 Glasses of wine per week    Family  History Family History  Problem Relation Age of Onset  . Deep vein thrombosis Father   . Heart disease Father [id number]       Before age 51  . Hyperlipidemia Father   . Heart attack Father   . Stroke Father     Past Surgical History:  Procedure Laterality Date  . CHOLECYSTECTOMY  10/2015  . HAND SURGERY    . IVC FILTER PLACEMENT ([facility] HX)  2016   . ROTATOR CUFF REPAIR    . THROMBOLYSIS Right Nov. 9, 2010   Lower Extrim.  . TONSILLECTOMY      No Known Allergies  Current Outpatient Prescriptions  Medication Sig Dispense Refill  . folic acid (FOLVITE) 1 MG tablet Take 1 tablet (1 mg total) by mouth daily. 90 tablet 6  . fondaparinux (ARIXTRA) 7.5 MG/0.6ML SOLN injection INJECT THE CONTENTS OF ONE SYRINGE SUBCUTANEOUSLY ONCE DAILY 18 mL 6  . omeprazole (PRILOSEC) 20 MG capsule Take 20 mg by mouth daily.    . Suvorexant (BELSOMRA) 10 MG TABS Take 10 mg by mouth at bedtime as needed. 30 tablet 0  . tamsulosin (FLOMAX) 0.4 MG CAPS capsule Take 1 capsule (0.4 mg total) by mouth daily. 30 capsule 3   No current facility-administered medications for this visit.     ROS: See HPI for pertinent positives and negatives.   Physical  Examination  Vitals:   06/02/16 0852  BP: 124/88  Pulse: 80  Resp: 16  SpO2: 97%  Weight: 154 lb 4 oz (70 kg)  Height: 5\' 8"  (1.727 m)   Body mass index is 23.45 kg/m.  General: A&O x 3, WDWN. Gait: normal Eyes: PERRLA. Pulmonary: Respirations are non labored, CTAB, without wheezes , rales, or rhonchi. Cardiac: regular rhythm and rate, no detected murmur.     Carotid Bruits Right Left   Negative Negative  Aorta is not palpable. Radial pulses: 2+ palpable and =   VASCULAR EXAM: Extremities without ischemic changes  without Gangrene; without open wounds. No swelling or edema in lower legs.      LE Pulses Right Left   FEMORAL 2+ palpable 1+ palpable    POPLITEAL not palpable  1+ palpable   POSTERIOR TIBIAL  1+ palpable   1+palpable    DORSALIS PEDIS  ANTERIOR TIBIAL faintly palpable  faintly palpable    Abdomen: soft, NT, no palpable masses. Skin: no rashes, no ulcers. Musculoskeletal: no muscle wasting or atrophy. Neurologic: A&O X 3; Appropriate Affect ; SENSATION: normal; MOTOR FUNCTION: moving all extremities equally, motor strength 5/5 throughout. Speech is fluent/normal. CN 2-12 intact     ASSESSMENT: Aaron Cunningham is a 51 y.o. male who is s/p right CIA stent with PTA in 2007 and right LE thrombolysis in 2010 by Dr. Hart Rochester. Review of records: February 2014 ABI's were in the normal range with bi and triphasic waveforms, digit pressures were normal range.  He had a PE in July 2016 and DVT in both calves in December 2016, is taking Arixtra. He has an IVC filter, placed in 2016. 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   DATA (06/02/2016):  Right aortoiliac duplex suggests a patent right common iliac artery stent with no visualized stenosis (<50%). All biphasic waveforms.  No significant change compared to the exam on 03-24-15.  ABI: Right: 1.08 (was 1.07 on 03-24-15), waveforms: biphasic; TBI: 0.73 (normal) Left: 1.12 (was 1.10), waveforms: triphasic; TBI: 0.74 (normal) Bilateral ABI's and TBI's remain normal with bi and triphasic waveforms.    PLAN:  Based on the patient's vascular studies and examination, pt will return to clinic in 1 year with right aortoiliac duplex and ABI's.   The patient was counseled re smoking cessation and given several free resources re smoking cessation.  I discussed in depth with the patient the nature of atherosclerosis, and  emphasized the importance of maximal medical management including strict control of blood pressure, blood glucose, and lipid levels, obtaining regular exercise, and cessation of smoking.  The patient is aware that without maximal medical management the underlying atherosclerotic disease process will progress, limiting the benefit of any interventions.  The patient was given information about PAD including signs, symptoms, treatment, what symptoms should prompt the patient to seek immediate medical care, and risk reduction measures to take.  Charisse March, RN, MSN, FNP-C [facility] [place] Phone: [phone]  Clinic MD: [facility]  06/02/16 9:21 AM

## 2016-06-03 LAB — IRON AND TIBC
%SAT: 34 % (ref 20–55)
Iron: 176 ug/dL — ABNORMAL HIGH (ref 42–163)
TIBC: 523 ug/dL — ABNORMAL HIGH (ref 202–409)
UIBC: 347 ug/dL (ref 117–376)

## 2016-06-03 LAB — FERRITIN: FERRITIN: 18 ng/mL — AB (ref 22–316)

## 2016-06-08 NOTE — Addendum Note (Signed)
Addended by: Burton ApleyPETTY, Alto Gandolfo A on: 06/08/2016 12:57 PM   Modules accepted: Orders

## 2016-06-30 ENCOUNTER — Other Ambulatory Visit (HOSPITAL_BASED_OUTPATIENT_CLINIC_OR_DEPARTMENT_OTHER): Payer: BC Managed Care – PPO

## 2016-06-30 ENCOUNTER — Ambulatory Visit (HOSPITAL_BASED_OUTPATIENT_CLINIC_OR_DEPARTMENT_OTHER): Payer: BC Managed Care – PPO | Admitting: Hematology & Oncology

## 2016-06-30 VITALS — BP 158/93 | HR 82 | Temp 98.5°F | Resp 16 | Wt 160.0 lb

## 2016-06-30 DIAGNOSIS — I82513 Chronic embolism and thrombosis of femoral vein, bilateral: Secondary | ICD-10-CM

## 2016-06-30 DIAGNOSIS — D696 Thrombocytopenia, unspecified: Secondary | ICD-10-CM

## 2016-06-30 DIAGNOSIS — I82403 Acute embolism and thrombosis of unspecified deep veins of lower extremity, bilateral: Secondary | ICD-10-CM

## 2016-06-30 DIAGNOSIS — Z720 Tobacco use: Secondary | ICD-10-CM

## 2016-06-30 DIAGNOSIS — D751 Secondary polycythemia: Secondary | ICD-10-CM

## 2016-06-30 DIAGNOSIS — M79604 Pain in right leg: Secondary | ICD-10-CM | POA: Diagnosis not present

## 2016-06-30 DIAGNOSIS — Z7901 Long term (current) use of anticoagulants: Secondary | ICD-10-CM | POA: Diagnosis not present

## 2016-06-30 DIAGNOSIS — I824Z2 Acute embolism and thrombosis of unspecified deep veins of left distal lower extremity: Secondary | ICD-10-CM

## 2016-06-30 LAB — CBC WITH DIFFERENTIAL (CANCER CENTER ONLY)
BASO#: 0 10*3/uL (ref 0.0–0.2)
BASO%: 0.4 % (ref 0.0–2.0)
EOS%: 7.7 % — ABNORMAL HIGH (ref 0.0–7.0)
Eosinophils Absolute: 0.2 10*3/uL (ref 0.0–0.5)
HEMATOCRIT: 39.4 % (ref 38.7–49.9)
HEMOGLOBIN: 12.8 g/dL — AB (ref 13.0–17.1)
LYMPH#: 0.9 10*3/uL (ref 0.9–3.3)
LYMPH%: 36.2 % (ref 14.0–48.0)
MCH: 29.4 pg (ref 28.0–33.4)
MCHC: 32.5 g/dL (ref 32.0–35.9)
MCV: 91 fL (ref 82–98)
MONO#: 0.3 10*3/uL (ref 0.1–0.9)
MONO%: 10.8 % (ref 0.0–13.0)
NEUT#: 1.2 10*3/uL — ABNORMAL LOW (ref 1.5–6.5)
NEUT%: 44.9 % (ref 40.0–80.0)
Platelets: 179 10*3/uL (ref 145–400)
RBC: 4.35 10*6/uL (ref 4.20–5.70)
RDW: 14.9 % (ref 11.1–15.7)
WBC: 2.6 10*3/uL — ABNORMAL LOW (ref 4.0–10.0)

## 2016-06-30 LAB — CMP (CANCER CENTER ONLY)
ALBUMIN: 3.4 g/dL (ref 3.3–5.5)
ALK PHOS: 69 U/L (ref 26–84)
ALT: 25 U/L (ref 10–47)
AST: 33 U/L (ref 11–38)
BILIRUBIN TOTAL: 0.7 mg/dL (ref 0.20–1.60)
BUN, Bld: 8 mg/dL (ref 7–22)
CALCIUM: 9.2 mg/dL (ref 8.0–10.3)
CO2: 29 mEq/L (ref 18–33)
Chloride: 104 mEq/L (ref 98–108)
Creat: 1.1 mg/dl (ref 0.6–1.2)
Glucose, Bld: 95 mg/dL (ref 73–118)
POTASSIUM: 4.3 meq/L (ref 3.3–4.7)
Sodium: 143 mEq/L (ref 128–145)
TOTAL PROTEIN: 6.3 g/dL — AB (ref 6.4–8.1)

## 2016-06-30 LAB — LACTATE DEHYDROGENASE: LDH: 192 U/L (ref 125–245)

## 2016-06-30 MED ORDER — TRAMADOL HCL 50 MG PO TABS: 50.0000 mg | ORAL_TABLET | Freq: Four times a day (QID) | ORAL | 0 refills | Status: DC | PRN

## 2016-06-30 NOTE — Progress Notes (Signed)
Hematology and Oncology Follow Up Visit  Aaron Cunningham [id number] 1965/03/11 51 y.o. 06/30/2016   Principle Diagnosis:  1. 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 History of DVT right lower extremity and PE 3. 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 S/p Thrombolysis through bilateral lower extremity infusion catheters 2. Arixtra 7.5 mg SQ daily - prefers this to oral anticoagulation, history of GI bleed on oral --  lifelong 3. Phlebotomy to maintain hematocrit below 45%     Interim History:  Aaron Cunningham is here today for a follow-up. He is feeling a bit tired. He is still working. He does smoke about half pack of cigarettes per day.   He has an incredibly stressful job. He works for the [employer] in [place]. This is very tough on him. He is walking all the time. He gets more pain in his right leg. He probably has some post phlebitic issues. He does have a compression stocking.   He still smoking. I understand why he is smoking.   He's had no bleeding. He's doing well with the Arixtra.   Back in May, his iron studies showed a ferritin of 18 with an iron saturation of 34%.   Overall, his performance status is ECOG 1.   Medications:  Allergies as of 06/30/2016   No Known Allergies     Medication List       Accurate as of 06/30/16  1:49 PM. Always use your most recent med list.          folic acid 1 MG tablet Commonly known as:  FOLVITE Take 1 tablet (1 mg total) by mouth daily.   fondaparinux 7.5 MG/0.6ML Soln injection Commonly known as:  Games developer THE CONTENTS OF ONE SYRINGE SUBCUTANEOUSLY ONCE DAILY   omeprazole 20 MG capsule Commonly known as:  PRILOSEC Take 20 mg by mouth daily.   Suvorexant 10 MG Tabs Commonly known as:  BELSOMRA Take 10 mg by mouth at bedtime as needed.   tamsulosin 0.4 MG Caps capsule Commonly known as:   FLOMAX Take 1 capsule (0.4 mg total) by mouth daily.       Allergies: No Known Allergies  Past Medical History, Surgical history, Social history, and Family History were reviewed and updated.  Review of Systems: All other 10 point review of systems is negative.   Physical Exam:  weight is 160 lb (72.6 kg). His oral temperature is 98.5 F (36.9 C). His blood pressure is 158/93 (abnormal) and his pulse is 82. His respiration is 16 and oxygen saturation is 99%.   Wt Readings from Last 3 Encounters:  06/30/16 160 lb (72.6 kg)  06/02/16 155 lb 6.4 oz (70.5 kg)  06/02/16 154 lb 4 oz (70 kg)    Well-developed and well-nourished white male. Head and neck exam shows no ocular or oral lesions. He has no palpable lymph nodes in the neck. Lungs are clear bilaterally. Cardiac exam regular rate and rhythm with no murmurs, rubs or bruits. Abdomen is soft. Has good bowel sounds. There is no fluid wave. There is no palpable liver or spleen tip. Extremities shows no obvious swelling legs. No venous cord is noted in the legs. Has a negative Homan's sign bilaterally. Back exam shows no tenderness over the spine, ribs or hips. Skin exam shows no rashes, ecchymoses or petechia.  Lab Results  Component Value Date   WBC  2.6 (L) 06/30/2016   HGB 12.8 (L) 06/30/2016   HCT 39.4 06/30/2016   MCV 91 06/30/2016   PLT 179 06/30/2016   Lab Results  Component Value Date   FERRITIN 18 (L) 06/02/2016   IRON 176 (H) 06/02/2016   TIBC 523 (H) 06/02/2016   UIBC 347 06/02/2016   IRONPCTSAT 34 06/02/2016   Lab Results  Component Value Date   RBC 4.35 06/30/2016   No results found for: KPAFRELGTCHN, LAMBDASER, KAPLAMBRATIO No results found for: IGGSERUM, IGA, IGMSERUM No results found for: Marda StalkerOTALPROTELP, ALBUMINELP, A1GS, A2GS, BETS, BETA2SER, GAMS, MSPIKE, SPEI   Chemistry      Component Value Date/Time   NA 143 06/30/2016 1310   NA 143 02/19/2016 0952   K 4.3 06/30/2016 1310   K 3.9 02/19/2016 0952    CL 104 06/30/2016 1310   CO2 29 06/30/2016 1310   CO2 28 02/19/2016 0952   BUN 8 06/30/2016 1310   BUN 8.3 02/19/2016 0952   CREATININE 1.1 06/30/2016 1310   CREATININE 1.0 02/19/2016 0952      Component Value Date/Time   CALCIUM 9.2 06/30/2016 1310   CALCIUM 9.0 02/19/2016 0952   ALKPHOS 69 06/30/2016 1310   ALKPHOS 122 02/19/2016 0952   AST 33 06/30/2016 1310   AST 51 (H) 02/19/2016 0952   ALT 25 06/30/2016 1310   ALT 47 02/19/2016 0952   BILITOT 0.70 06/30/2016 1310   BILITOT 0.69 02/19/2016 0952     Impression and Plan: Aaron Cunningham is a very pleasant 51 yo white male with recurrent bilateral lower extremity DVTs. IVC filter is in place and he is on lifelong anticoagulation with Arixtra.   I am absolutely amazed as to his job. He works in a prison. I hate to think about what he has to go through.  I will see if tramadol will help with some of the pain in his right leg.  We will plan to get him back to see us in another 3 months. He does not need to be phlebotomized.    Aaron Cunningham,Aaron R, MD 6/14/20181:49 PM

## 2016-08-02 ENCOUNTER — Other Ambulatory Visit: Payer: Self-pay | Admitting: Family

## 2016-08-02 ENCOUNTER — Other Ambulatory Visit: Payer: Self-pay | Admitting: Hematology & Oncology

## 2016-08-02 DIAGNOSIS — D696 Thrombocytopenia, unspecified: Secondary | ICD-10-CM

## 2016-08-02 DIAGNOSIS — I824Z2 Acute embolism and thrombosis of unspecified deep veins of left distal lower extremity: Secondary | ICD-10-CM

## 2016-08-02 DIAGNOSIS — I2692 Saddle embolus of pulmonary artery without acute cor pulmonale: Secondary | ICD-10-CM

## 2016-08-02 DIAGNOSIS — I82413 Acute embolism and thrombosis of femoral vein, bilateral: Secondary | ICD-10-CM

## 2016-08-02 DIAGNOSIS — R160 Hepatomegaly, not elsewhere classified: Secondary | ICD-10-CM

## 2016-08-02 DIAGNOSIS — G4701 Insomnia due to medical condition: Secondary | ICD-10-CM

## 2016-09-30 ENCOUNTER — Other Ambulatory Visit: Payer: BC Managed Care – PPO

## 2016-09-30 ENCOUNTER — Ambulatory Visit: Payer: BC Managed Care – PPO | Admitting: Hematology & Oncology

## 2016-10-10 ENCOUNTER — Other Ambulatory Visit: Payer: Self-pay | Admitting: Hematology & Oncology

## 2016-10-10 DIAGNOSIS — D696 Thrombocytopenia, unspecified: Secondary | ICD-10-CM

## 2016-10-10 DIAGNOSIS — I824Z2 Acute embolism and thrombosis of unspecified deep veins of left distal lower extremity: Secondary | ICD-10-CM

## 2016-10-14 ENCOUNTER — Ambulatory Visit (HOSPITAL_BASED_OUTPATIENT_CLINIC_OR_DEPARTMENT_OTHER): Payer: BC Managed Care – PPO | Admitting: Hematology & Oncology

## 2016-10-14 ENCOUNTER — Other Ambulatory Visit (HOSPITAL_BASED_OUTPATIENT_CLINIC_OR_DEPARTMENT_OTHER): Payer: BC Managed Care – PPO

## 2016-10-14 VITALS — BP 130/94 | HR 75 | Temp 98.1°F | Resp 18 | Wt 154.0 lb

## 2016-10-14 DIAGNOSIS — D696 Thrombocytopenia, unspecified: Secondary | ICD-10-CM

## 2016-10-14 DIAGNOSIS — Z862 Personal history of diseases of the blood and blood-forming organs and certain disorders involving the immune mechanism: Secondary | ICD-10-CM | POA: Diagnosis not present

## 2016-10-14 DIAGNOSIS — Z86711 Personal history of pulmonary embolism: Secondary | ICD-10-CM

## 2016-10-14 DIAGNOSIS — Z86718 Personal history of other venous thrombosis and embolism: Secondary | ICD-10-CM

## 2016-10-14 DIAGNOSIS — Z7901 Long term (current) use of anticoagulants: Secondary | ICD-10-CM | POA: Diagnosis not present

## 2016-10-14 DIAGNOSIS — I824Z2 Acute embolism and thrombosis of unspecified deep veins of left distal lower extremity: Secondary | ICD-10-CM

## 2016-10-14 LAB — CMP (CANCER CENTER ONLY)
ALT: 37 U/L (ref 10–47)
AST: 61 U/L — ABNORMAL HIGH (ref 11–38)
Albumin: 3.6 g/dL (ref 3.3–5.5)
Alkaline Phosphatase: 70 U/L (ref 26–84)
BUN: 10 mg/dL (ref 7–22)
CHLORIDE: 102 meq/L (ref 98–108)
CO2: 32 mEq/L (ref 18–33)
Calcium: 9.4 mg/dL (ref 8.0–10.3)
Creat: 0.8 mg/dl (ref 0.6–1.2)
GLUCOSE: 97 mg/dL (ref 73–118)
POTASSIUM: 4.2 meq/L (ref 3.3–4.7)
SODIUM: 144 meq/L (ref 128–145)
Total Bilirubin: 0.7 mg/dl (ref 0.20–1.60)
Total Protein: 6.5 g/dL (ref 6.4–8.1)

## 2016-10-14 LAB — CBC WITH DIFFERENTIAL (CANCER CENTER ONLY)
BASO#: 0 10*3/uL (ref 0.0–0.2)
BASO%: 0.4 % (ref 0.0–2.0)
EOS%: 4.6 % (ref 0.0–7.0)
Eosinophils Absolute: 0.2 10*3/uL (ref 0.0–0.5)
HCT: 43.9 % (ref 38.7–49.9)
HGB: 14.6 g/dL (ref 13.0–17.1)
LYMPH#: 2 10*3/uL (ref 0.9–3.3)
LYMPH%: 42.6 % (ref 14.0–48.0)
MCH: 29.4 pg (ref 28.0–33.4)
MCHC: 33.3 g/dL (ref 32.0–35.9)
MCV: 89 fL (ref 82–98)
MONO#: 0.4 10*3/uL (ref 0.1–0.9)
MONO%: 8.1 % (ref 0.0–13.0)
NEUT#: 2.1 10*3/uL (ref 1.5–6.5)
NEUT%: 44.3 % (ref 40.0–80.0)
PLATELETS: 170 10*3/uL (ref 145–400)
RBC: 4.96 10*6/uL (ref 4.20–5.70)
RDW: 19.1 % — ABNORMAL HIGH (ref 11.1–15.7)
WBC: 4.8 10*3/uL (ref 4.0–10.0)
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
## 2016-10-15 LAB — RETICULOCYTES: Reticulocyte Count: 1.3 % (ref 0.6–2.6)

## 2016-10-17 LAB — FERRITIN: FERRITIN: 14 ng/mL — AB (ref 22–316)

## 2016-10-17 LAB — IRON AND TIBC
%SAT: 11 % — ABNORMAL LOW (ref 20–55)
Iron: 50 ug/dL (ref 42–163)
TIBC: 454 ug/dL — AB (ref 202–409)
UIBC: 404 ug/dL — AB (ref 117–376)

## 2016-11-29 ENCOUNTER — Other Ambulatory Visit: Payer: Self-pay | Admitting: Hematology & Oncology

## 2016-11-29 DIAGNOSIS — I82409 Acute embolism and thrombosis of unspecified deep veins of unspecified lower extremity: Secondary | ICD-10-CM

## 2017-01-20 ENCOUNTER — Ambulatory Visit (HOSPITAL_BASED_OUTPATIENT_CLINIC_OR_DEPARTMENT_OTHER): Payer: BC Managed Care – PPO | Admitting: Hematology & Oncology

## 2017-01-20 ENCOUNTER — Other Ambulatory Visit: Payer: Self-pay

## 2017-01-20 ENCOUNTER — Ambulatory Visit (HOSPITAL_BASED_OUTPATIENT_CLINIC_OR_DEPARTMENT_OTHER): Payer: BC Managed Care – PPO

## 2017-01-20 ENCOUNTER — Encounter: Payer: Self-pay | Admitting: Hematology & Oncology

## 2017-01-20 ENCOUNTER — Other Ambulatory Visit (HOSPITAL_BASED_OUTPATIENT_CLINIC_OR_DEPARTMENT_OTHER): Payer: BC Managed Care – PPO

## 2017-01-20 DIAGNOSIS — Z720 Tobacco use: Secondary | ICD-10-CM | POA: Diagnosis not present

## 2017-01-20 DIAGNOSIS — I824Z2 Acute embolism and thrombosis of unspecified deep veins of left distal lower extremity: Secondary | ICD-10-CM

## 2017-01-20 DIAGNOSIS — D751 Secondary polycythemia: Secondary | ICD-10-CM

## 2017-01-20 LAB — CBC WITH DIFFERENTIAL (CANCER CENTER ONLY)
BASO#: 0 10*3/uL (ref 0.0–0.2)
BASO%: 0.3 % (ref 0.0–2.0)
EOS%: 5.2 % (ref 0.0–7.0)
Eosinophils Absolute: 0.2 10*3/uL (ref 0.0–0.5)
HCT: 45.8 % (ref 38.7–49.9)
HGB: 15.4 g/dL (ref 13.0–17.1)
LYMPH#: 1.2 10*3/uL (ref 0.9–3.3)
LYMPH%: 37.2 % (ref 14.0–48.0)
MCH: 30.1 pg (ref 28.0–33.4)
MCHC: 33.6 g/dL (ref 32.0–35.9)
MCV: 90 fL (ref 82–98)
MONO#: 0.3 10*3/uL (ref 0.1–0.9)
MONO%: 9.5 % (ref 0.0–13.0)
NEUT#: 1.6 10*3/uL (ref 1.5–6.5)
NEUT%: 47.8 % (ref 40.0–80.0)
PLATELETS: 171 10*3/uL (ref 145–400)
RBC: 5.11 10*6/uL (ref 4.20–5.70)
RDW: 15.2 % (ref 11.1–15.7)
WBC: 3.3 10*3/uL — AB (ref 4.0–10.0)

## 2017-01-20 LAB — CMP (CANCER CENTER ONLY)
ALK PHOS: 59 U/L (ref 26–84)
ALT: 28 U/L (ref 10–47)
AST: 32 U/L (ref 11–38)
Albumin: 3.6 g/dL (ref 3.3–5.5)
BILIRUBIN TOTAL: 0.8 mg/dL (ref 0.20–1.60)
BUN: 7 mg/dL (ref 7–22)
CO2: 30 mEq/L (ref 18–33)
CREATININE: 0.9 mg/dL (ref 0.6–1.2)
Calcium: 9.5 mg/dL (ref 8.0–10.3)
Chloride: 103 mEq/L (ref 98–108)
Glucose, Bld: 136 mg/dL — ABNORMAL HIGH (ref 73–118)
Potassium: 3.7 mEq/L (ref 3.3–4.7)
SODIUM: 145 meq/L (ref 128–145)
TOTAL PROTEIN: 6.7 g/dL (ref 6.4–8.1)

## 2017-01-20 LAB — FERRITIN: FERRITIN: 10 ng/mL — AB (ref 22–316)

## 2017-01-20 LAB — IRON AND TIBC
%SAT: 13 % — ABNORMAL LOW (ref 20–55)
Iron: 56 ug/dL (ref 42–163)
TIBC: 445 ug/dL — ABNORMAL HIGH (ref 202–409)
UIBC: 389 ug/dL — ABNORMAL HIGH (ref 117–376)

## 2017-01-20 MED ORDER — FOLIC ACID 1 MG PO TABS: 1.0000 mg | ORAL_TABLET | Freq: Every day | ORAL | 6 refills | Status: DC

## 2017-01-20 MED ORDER — OLMESARTAN MEDOXOMIL 20 MG PO TABS: 20.0000 mg | ORAL_TABLET | Freq: Every day | ORAL | 6 refills | Status: DC

## 2017-01-20 NOTE — Progress Notes (Signed)
Hematology and Oncology Follow Up Visit  Aaron FallsCharles Scott Cunningham [id number] [birth year] 52 y.o. 01/20/2017   Principle Diagnosis:  1. 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 History of DVT right lower extremity and PE 3. 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 S/p Thrombolysis through bilateral lower extremity infusion catheters 2. Arixtra 7.5 mg SQ daily - prefers this to oral anticoagulation, history of GI bleed on oral --  lifelong 3. Phlebotomy to maintain hematocrit below 45%     Interim History:  Aaron Cunningham is here today for a follow-up.  Is doing okay.  He is a little bit stressed out.  He has been working an incredible amount of hours.  He probably has been working close to 80 hours a week.  [place] [employer]  He worked the holidays.  He gets quite a bit of overtime when he does work.  He says that he has been incredibly difficult finding people to work at his location.  He ran out of folic acid.  He is not on any blood pressure medications.  Blood pressure has been running incredibly high.  We checked it about 3 times today and it was still with a diastolic over 100.  He has had no change in bowel or bladder habits.  He has had no visual changes.  He has had occasional headaches.  It has been about 3 months or so since he has been phlebotomized.  His last iron studies back in September showed a ferritin of 14 with iron saturation of 11%.  Overall, his performance status is ECOG 0.   Medications:  Allergies as of 01/20/2017   No Known Allergies     Medication List        Accurate as of 01/20/17 12:44 PM. Always use your most recent med list.          BELSOMRA 10 MG Tabs Generic drug:  Suvorexant TAKE ONE TABLET BY MOUTH AT BEDTIME AS NEEDED   folic acid 1 MG tablet Commonly known as:  FOLVITE Take 1 tablet (1 mg total) by mouth daily.   fondaparinux  7.5 MG/0.6ML Soln injection Commonly known as:  Games developerARIXTRA INJECT THE CONTENTS OF ONE SYRINGE SUBCUTANEOUSLY ONCE DAILY   omeprazole 20 MG capsule Commonly known as:  PRILOSEC Take 20 mg by mouth daily.   PROAIR HFA 108 (90 Base) MCG/ACT inhaler Generic drug:  albuterol inhale TWO PUFF EVERY 4 TO 6 HOURS AS NEEDED FOR coughing/ wheezing/ SHORTNESS OF BREATH   tamsulosin 0.4 MG Caps capsule Commonly known as:  FLOMAX TAKE ONE CAPSULE BY MOUTH EVERY DAY   traMADol 50 MG tablet Commonly known as:  ULTRAM TAKE ONE TABLET BY MOUTH EVERY 6 HOURS AS NEEDED       Allergies: No Known Allergies  Past Medical History, Surgical history, Social history, and Family History were reviewed and updated.  Review of Systems: As stated in the interim history  Physical Exam:  weight is 153 lb 6.4 oz (69.6 kg). His oral temperature is 98.4 F (36.9 C). His blood pressure is 160/101 (abnormal) and his pulse is 74. His respiration is 20 and oxygen saturation is 99%.   Wt Readings from Last 3 Encounters:  01/20/17 153 lb 6.4 oz (69.6 kg)  10/14/16 154 lb (69.9 kg)  06/30/16 160 lb (72.6 kg)    Physical Exam  Constitutional: He is oriented to  person, place, and time.  HENT:  Head: Normocephalic and atraumatic.  Mouth/Throat: Oropharynx is clear and moist.  Eyes: EOM are normal. Pupils are equal, round, and reactive to light.  Neck: Normal range of motion.  Cardiovascular: Normal rate, regular rhythm and normal heart sounds.  Pulmonary/Chest: Effort normal and breath sounds normal.  Abdominal: Soft. Bowel sounds are normal.  Musculoskeletal: Normal range of motion. He exhibits no edema, tenderness or deformity.  Lymphadenopathy:    He has no cervical adenopathy.  Neurological: He is alert and oriented to person, place, and time.  Skin: Skin is warm and dry. No rash noted. There is erythema.  Psychiatric: He has a normal mood and affect. His behavior is normal. Judgment and thought content  normal.  Vitals reviewed.    Lab Results  Component Value Date   WBC 3.3 (L) 01/20/2017   HGB 15.4 01/20/2017   HCT 45.8 01/20/2017   MCV 90 01/20/2017   PLT 171 01/20/2017   Lab Results  Component Value Date   FERRITIN 14 (L) 10/14/2016   IRON 50 10/14/2016   TIBC 454 (H) 10/14/2016   UIBC 404 (H) 10/14/2016   IRONPCTSAT 11 (L) 10/14/2016   Lab Results  Component Value Date   RBC 5.11 01/20/2017   No results found for: KPAFRELGTCHN, LAMBDASER, KAPLAMBRATIO No results found for: Loel Lofty, IGMSERUM No results found for: Marda Stalker, SPEI   Chemistry      Component Value Date/Time   NA 145 01/20/2017 1113   NA 143 02/19/2016 0952   K 3.7 01/20/2017 1113   K 3.9 02/19/2016 0952   CL 103 01/20/2017 1113   CO2 30 01/20/2017 1113   CO2 28 02/19/2016 0952   BUN 7 01/20/2017 1113   BUN 8.3 02/19/2016 0952   CREATININE 0.9 01/20/2017 1113   CREATININE 1.0 02/19/2016 0952      Component Value Date/Time   CALCIUM 9.5 01/20/2017 1113   CALCIUM 9.0 02/19/2016 0952   ALKPHOS 59 01/20/2017 1113   ALKPHOS 122 02/19/2016 0952   AST 32 01/20/2017 1113   AST 51 (H) 02/19/2016 0952   ALT 28 01/20/2017 1113   ALT 47 02/19/2016 0952   BILITOT 0.80 01/20/2017 1113   BILITOT 0.69 02/19/2016 0952     Impression and Plan: [name] is a very pleasant 52 yo white male with recurrent bilateral lower extremity DVTs. IVC filter is in place and he is on lifelong anticoagulation with Arixtra.   We will go ahead and phlebotomize him today.  His blood pressure is horrible.  I will put him on some Benicar (20 mg p.o. daily).  He ran out of his folic acid.  We will also call some folic acid and to his pharmacy.  It is amazing that he is doing so well given his job status.  He really has a very stressful job.  He has worked a ton of over time since finding help has been incredibly difficult.  I will plan to see him back in  about 2 months.   [name], MD 1/4/201912:44 PM

## 2017-01-20 NOTE — Progress Notes (Signed)
Aaron Cunningham presents today for phlebotomy per MD orders. Phlebotomy procedure started at 1245 and ended at 1255. 561 grams removed. Patient observed for 30 minutes after procedure without any incident. Patient tolerated procedure well. IV needle removed intact.

## 2017-01-20 NOTE — Patient Instructions (Signed)

## 2017-01-20 NOTE — Progress Notes (Signed)
Patient is unwilling to wait 30 minutes after phlebotomy. Vitals taken after 15 minutes and he feels fine. Patient has eaten and drunk fluids while here.

## 2017-02-06 ENCOUNTER — Telehealth: Payer: Self-pay

## 2017-02-06 NOTE — Telephone Encounter (Signed)
Received message from pt reporting his BP continues to be high even after starting Benicar 20mg  daily.  Today's recorded BP's have been:  180/102 156/92 144/89  174/110  Per Dr Myna HidalgoEnnever, pt to increase Benicar to 40mg  daily. Pt verbalizes understanding and repeats back instructions. dph

## 2017-02-08 ENCOUNTER — Other Ambulatory Visit: Payer: Self-pay | Admitting: Family

## 2017-02-08 ENCOUNTER — Other Ambulatory Visit: Payer: Self-pay | Admitting: Hematology & Oncology

## 2017-02-08 DIAGNOSIS — I2692 Saddle embolus of pulmonary artery without acute cor pulmonale: Secondary | ICD-10-CM

## 2017-02-08 DIAGNOSIS — D696 Thrombocytopenia, unspecified: Secondary | ICD-10-CM

## 2017-02-08 DIAGNOSIS — I824Z2 Acute embolism and thrombosis of unspecified deep veins of left distal lower extremity: Secondary | ICD-10-CM

## 2017-02-08 DIAGNOSIS — I82413 Acute embolism and thrombosis of femoral vein, bilateral: Secondary | ICD-10-CM

## 2017-02-09 DIAGNOSIS — B9789 Other viral agents as the cause of diseases classified elsewhere: Secondary | ICD-10-CM | POA: Insufficient documentation

## 2017-03-14 ENCOUNTER — Other Ambulatory Visit: Payer: Self-pay | Admitting: *Deleted

## 2017-03-14 MED ORDER — OLMESARTAN MEDOXOMIL 40 MG PO TABS: 40.0000 mg | ORAL_TABLET | Freq: Every day | ORAL | 6 refills | Status: DC

## 2017-03-16 ENCOUNTER — Other Ambulatory Visit: Payer: Self-pay | Admitting: *Deleted

## 2017-03-17 ENCOUNTER — Inpatient Hospital Stay: Payer: BC Managed Care – PPO

## 2017-03-17 ENCOUNTER — Inpatient Hospital Stay: Payer: BC Managed Care – PPO | Admitting: Hematology & Oncology

## 2017-04-05 ENCOUNTER — Other Ambulatory Visit: Payer: Self-pay

## 2017-04-05 ENCOUNTER — Inpatient Hospital Stay: Payer: BC Managed Care – PPO

## 2017-04-05 ENCOUNTER — Encounter: Payer: Self-pay | Admitting: Family

## 2017-04-05 ENCOUNTER — Inpatient Hospital Stay (HOSPITAL_BASED_OUTPATIENT_CLINIC_OR_DEPARTMENT_OTHER): Payer: BC Managed Care – PPO | Admitting: Family

## 2017-04-05 ENCOUNTER — Inpatient Hospital Stay: Payer: BC Managed Care – PPO | Attending: Hematology & Oncology

## 2017-04-05 VITALS — BP 128/82 | HR 94 | Temp 98.2°F | Resp 20 | Wt 154.1 lb

## 2017-04-05 DIAGNOSIS — Z87891 Personal history of nicotine dependence: Secondary | ICD-10-CM | POA: Insufficient documentation

## 2017-04-05 DIAGNOSIS — Z86718 Personal history of other venous thrombosis and embolism: Secondary | ICD-10-CM | POA: Insufficient documentation

## 2017-04-05 DIAGNOSIS — D696 Thrombocytopenia, unspecified: Secondary | ICD-10-CM

## 2017-04-05 DIAGNOSIS — D500 Iron deficiency anemia secondary to blood loss (chronic): Secondary | ICD-10-CM

## 2017-04-05 DIAGNOSIS — Z7901 Long term (current) use of anticoagulants: Secondary | ICD-10-CM

## 2017-04-05 DIAGNOSIS — D751 Secondary polycythemia: Secondary | ICD-10-CM | POA: Insufficient documentation

## 2017-04-05 DIAGNOSIS — I82513 Chronic embolism and thrombosis of femoral vein, bilateral: Secondary | ICD-10-CM

## 2017-04-05 DIAGNOSIS — I82403 Acute embolism and thrombosis of unspecified deep veins of lower extremity, bilateral: Secondary | ICD-10-CM | POA: Insufficient documentation

## 2017-04-05 DIAGNOSIS — Z79899 Other long term (current) drug therapy: Secondary | ICD-10-CM | POA: Diagnosis not present

## 2017-04-05 DIAGNOSIS — I824Z2 Acute embolism and thrombosis of unspecified deep veins of left distal lower extremity: Secondary | ICD-10-CM

## 2017-04-05 DIAGNOSIS — K922 Gastrointestinal hemorrhage, unspecified: Secondary | ICD-10-CM | POA: Insufficient documentation

## 2017-04-05 LAB — CBC WITH DIFFERENTIAL (CANCER CENTER ONLY)
Basophils Absolute: 0 10*3/uL (ref 0.0–0.1)
Basophils Relative: 0 %
EOS ABS: 0.2 10*3/uL (ref 0.0–0.5)
EOS PCT: 3 %
HCT: 43.8 % (ref 38.7–49.9)
Hemoglobin: 14.6 g/dL (ref 13.0–17.1)
LYMPHS ABS: 1.4 10*3/uL (ref 0.9–3.3)
LYMPHS PCT: 16 %
MCH: 30.4 pg (ref 28.0–33.4)
MCHC: 33.3 g/dL (ref 32.0–35.9)
MCV: 91.3 fL (ref 82.0–98.0)
MONOS PCT: 4 %
Monocytes Absolute: 0.4 10*3/uL (ref 0.1–0.9)
Neutro Abs: 6.6 10*3/uL — ABNORMAL HIGH (ref 1.5–6.5)
Neutrophils Relative %: 77 %
Platelet Count: 186 10*3/uL (ref 145–400)
RBC: 4.8 MIL/uL (ref 4.20–5.70)
RDW: 16 % — ABNORMAL HIGH (ref 11.1–15.7)
WBC Count: 8.6 10*3/uL (ref 4.0–10.0)

## 2017-04-05 LAB — CMP (CANCER CENTER ONLY)
ALT: 23 U/L (ref 0–55)
ANION GAP: 12 — AB (ref 3–11)
AST: 33 U/L (ref 5–34)
Albumin: 3.7 g/dL (ref 3.5–5.0)
Alkaline Phosphatase: 67 U/L (ref 40–150)
BUN: 9 mg/dL (ref 7–26)
CO2: 26 mmol/L (ref 22–29)
CREATININE: 1.14 mg/dL (ref 0.70–1.30)
Calcium: 9 mg/dL (ref 8.4–10.4)
Chloride: 104 mmol/L (ref 98–109)
Glucose, Bld: 130 mg/dL (ref 70–140)
Potassium: 3.9 mmol/L (ref 3.5–5.1)
SODIUM: 142 mmol/L (ref 136–145)
TOTAL PROTEIN: 7 g/dL (ref 6.4–8.3)
Total Bilirubin: 0.5 mg/dL (ref 0.2–1.2)

## 2017-04-05 LAB — LACTATE DEHYDROGENASE: LDH: 243 U/L (ref 125–245)

## 2017-04-05 NOTE — Progress Notes (Signed)
Hematology and Oncology Follow Up Visit  Aaron Cunningham [id number] December 04, 1965 52 y.o. 04/05/2017   Principle Diagnosis:  1. 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 History of DVT right lower extremity and PE 3. 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 S/p Thrombolysis through bilateral lower extremity infusion catheters 2. Arixtra 7.5 mg SQ daily - prefers this to oral anticoagulation, history of GI bleed on oral --  lifelong 3. Phlebotomy to maintain hematocrit below 45%   Interim History:  Aaron Cunningham is here today for follow-up. He is staying quite busy with work and not getting many days off. Being on his feet for an extended period of time has caused some swelling and tenderness in the right lower extremity. This comes and goes.  He is wearing his compression stockings daily and we gave him another note for 8 in boots for work which he finds to be more comfortable and supportive.  He has had no issue with bleeding, no bruising or petechiae. He is administering the Aredia daily as prescribed.  He is now 20 days cigarette free. He is using a vape off and on. His Hct today is 43.8.  He has had no fever, chills, n/v, cough, rash, dizziness, SOB, chest pain, palpitations, abdominal pain or changes in bowel or bladder habits.  No numbness or tingling in his extremities.  His appetite comes and goes. He is staying well hydrated. His weight is stable.   ECOG Performance Status: 1 - Symptomatic but completely ambulatory  Medications:  Allergies as of 04/05/2017   No Known Allergies     Medication List        Accurate as of 04/05/17 12:15 PM. Always use your most recent med list.          BELSOMRA 10 MG Tabs Generic drug:  Suvorexant TAKE ONE TABLET BY MOUTH AT BEDTIME AS NEEDED   folic acid 1 MG tablet Commonly known as:  FOLVITE Take 1 tablet (1 mg total) by mouth  daily.   fondaparinux 7.5 MG/0.6ML Soln injection Commonly known as:  Games developer THE CONTENTS OF ONE SYRINGE SUBCUTANEOUSLY ONCE DAILY   olmesartan 40 MG tablet Commonly known as:  BENICAR Take 1 tablet (40 mg total) by mouth daily.   omeprazole 20 MG capsule Commonly known as:  PRILOSEC Take 20 mg by mouth daily.   PROAIR HFA 108 (90 Base) MCG/ACT inhaler Generic drug:  albuterol inhale TWO PUFF EVERY 4 TO 6 HOURS AS NEEDED FOR coughing/ wheezing/ SHORTNESS OF BREATH   tamsulosin 0.4 MG Caps capsule Commonly known as:  FLOMAX TAKE ONE CAPSULE BY MOUTH EVERY DAY   traMADol 50 MG tablet Commonly known as:  ULTRAM TAKE ONE TABLET BY MOUTH EVERY 6 HOURS AS NEEDED       Allergies: No Known Allergies  Past Medical History, Surgical history, Social history, and Family History were reviewed and updated.  Review of Systems: All other 10 point review of systems is negative.   Physical Exam:  vitals were not taken for this visit.   Wt Readings from Last 3 Encounters:  01/20/17 153 lb 6.4 oz (69.6 kg)  10/14/16 154 lb (69.9 kg)  06/30/16 160 lb (72.6 kg)    Ocular: Sclerae unicteric, pupils equal, round and reactive to light Ear-nose-throat: Oropharynx clear, dentition fair Lymphatic: No cervical, supraclavicular or axillary adenopathy Lungs no rales or rhonchi, good excursion bilaterally  Heart regular rate and rhythm, no murmur appreciated Abd soft, nontender, positive bowel sounds, no liver or spleen tip palpated on exam, no fluid wave  MSK no focal spinal tenderness, no joint edema Neuro: non-focal, well-oriented, appropriate affect Breasts: Deferred   Lab Results  Component Value Date   WBC 8.6 04/05/2017   HGB 15.4 01/20/2017   HCT 43.8 04/05/2017   MCV 91.3 04/05/2017   PLT 186 04/05/2017   Lab Results  Component Value Date   FERRITIN 10 (L) 01/20/2017   IRON 56 01/20/2017   TIBC 445 (H) 01/20/2017   UIBC 389 (H) 01/20/2017   IRONPCTSAT 13 (L)  01/20/2017   Lab Results  Component Value Date   RBC 4.80 04/05/2017   No results found for: KPAFRELGTCHN, LAMBDASER, KAPLAMBRATIO No results found for: Loel LoftyGGSERUM, IGA, IGMSERUM No results found for: Marda StalkerOTALPROTELP, ALBUMINELP, A1GS, A2GS, BETS, BETA2SER, GAMS, MSPIKE, SPEI   Chemistry      Component Value Date/Time   NA 145 01/20/2017 1113   NA 143 02/19/2016 0952   K 3.7 01/20/2017 1113   K 3.9 02/19/2016 0952   CL 103 01/20/2017 1113   CO2 30 01/20/2017 1113   CO2 28 02/19/2016 0952   BUN 7 01/20/2017 1113   BUN 8.3 02/19/2016 0952   CREATININE 0.9 01/20/2017 1113   CREATININE 1.0 02/19/2016 0952      Component Value Date/Time   CALCIUM 9.5 01/20/2017 1113   CALCIUM 9.0 02/19/2016 0952   ALKPHOS 59 01/20/2017 1113   ALKPHOS 122 02/19/2016 0952   AST 32 01/20/2017 1113   AST 51 (H) 02/19/2016 0952   ALT 28 01/20/2017 1113   ALT 47 02/19/2016 0952   BILITOT 0.80 01/20/2017 1113   BILITOT 0.69 02/19/2016 0952      Impression and Plan: [name] is a pleasant 52 yo caucasian gentleman with recurrent lower extremity DVT. IVC filter is in place and he is on lifelong anticoagulation with Arixtra.  He has stopped smoking cigarettes and is only using a vape some of the time. His Hct is 43.8 so no phlebotomy needed at this time.  We will continue to follow along with him and plan to see him back in another 3 months for follow-up.  He will contact our office with any questions or concerns. We can certainly see him sooner if need be.   Aaron GinsSarah Jake Goodson, NP 3/20/201912:15 PM

## 2017-04-06 LAB — IRON AND TIBC
Iron: 86 ug/dL (ref 42–163)
SATURATION RATIOS: 19 % — AB (ref 42–163)
TIBC: 449 ug/dL — ABNORMAL HIGH (ref 202–409)
UIBC: 363 ug/dL

## 2017-04-06 LAB — FERRITIN: Ferritin: 14 ng/mL — ABNORMAL LOW (ref 22–316)

## 2017-04-10 ENCOUNTER — Telehealth: Payer: Self-pay | Admitting: *Deleted

## 2017-04-10 DIAGNOSIS — D509 Iron deficiency anemia, unspecified: Secondary | ICD-10-CM

## 2017-04-10 MED ORDER — INTEGRA PLUS PO CAPS: 1.0000 | ORAL_CAPSULE | Freq: Every day | ORAL | 2 refills | Status: DC

## 2017-04-10 NOTE — Telephone Encounter (Addendum)
Patient is aware of results. He is aware of new prescription. Pharmacy confirmed.   ----- Message from Josph MachoPeter R Ennever, MD sent at 04/09/2017  8:13 PM EDT ----- Call - the iron level is low!! Call in Integra -- I po bid.  pete

## 2017-04-11 ENCOUNTER — Other Ambulatory Visit: Payer: Self-pay | Admitting: Hematology & Oncology

## 2017-04-11 DIAGNOSIS — D696 Thrombocytopenia, unspecified: Secondary | ICD-10-CM

## 2017-04-11 DIAGNOSIS — I824Z2 Acute embolism and thrombosis of unspecified deep veins of left distal lower extremity: Secondary | ICD-10-CM

## 2017-04-17 ENCOUNTER — Other Ambulatory Visit: Payer: Self-pay

## 2017-04-17 ENCOUNTER — Inpatient Hospital Stay: Payer: BC Managed Care – PPO

## 2017-04-17 ENCOUNTER — Encounter: Payer: Self-pay | Admitting: Family

## 2017-04-17 ENCOUNTER — Inpatient Hospital Stay: Payer: BC Managed Care – PPO | Attending: Hematology & Oncology | Admitting: Family

## 2017-04-17 ENCOUNTER — Encounter: Payer: Self-pay | Admitting: *Deleted

## 2017-04-17 VITALS — BP 166/92 | HR 108 | Temp 98.5°F | Resp 18 | Wt 154.0 lb

## 2017-04-17 DIAGNOSIS — Z7901 Long term (current) use of anticoagulants: Secondary | ICD-10-CM | POA: Diagnosis not present

## 2017-04-17 DIAGNOSIS — D509 Iron deficiency anemia, unspecified: Secondary | ICD-10-CM

## 2017-04-17 DIAGNOSIS — Z86718 Personal history of other venous thrombosis and embolism: Secondary | ICD-10-CM | POA: Diagnosis not present

## 2017-04-17 DIAGNOSIS — Z79899 Other long term (current) drug therapy: Secondary | ICD-10-CM | POA: Diagnosis not present

## 2017-04-17 DIAGNOSIS — F419 Anxiety disorder, unspecified: Secondary | ICD-10-CM

## 2017-04-17 DIAGNOSIS — I82403 Acute embolism and thrombosis of unspecified deep veins of lower extremity, bilateral: Secondary | ICD-10-CM | POA: Diagnosis not present

## 2017-04-17 DIAGNOSIS — N390 Urinary tract infection, site not specified: Secondary | ICD-10-CM | POA: Insufficient documentation

## 2017-04-17 DIAGNOSIS — N3000 Acute cystitis without hematuria: Secondary | ICD-10-CM

## 2017-04-17 DIAGNOSIS — R319 Hematuria, unspecified: Secondary | ICD-10-CM

## 2017-04-17 LAB — CMP (CANCER CENTER ONLY)
ALBUMIN: 3.8 g/dL (ref 3.5–5.0)
ALK PHOS: 79 U/L (ref 26–84)
ALT: 40 U/L (ref 10–47)
AST: 60 U/L — AB (ref 11–38)
Anion gap: 16 — ABNORMAL HIGH (ref 5–15)
BUN: 10 mg/dL (ref 7–22)
CALCIUM: 8.6 mg/dL (ref 8.0–10.3)
CO2: 30 mmol/L (ref 18–33)
CREATININE: 0.9 mg/dL (ref 0.60–1.20)
Chloride: 96 mmol/L — ABNORMAL LOW (ref 98–108)
GLUCOSE: 188 mg/dL — AB (ref 73–118)
Potassium: 3.7 mmol/L (ref 3.3–4.7)
SODIUM: 142 mmol/L (ref 128–145)
TOTAL PROTEIN: 7 g/dL (ref 6.4–8.1)
Total Bilirubin: 0.9 mg/dL (ref 0.2–1.6)

## 2017-04-17 LAB — URINALYSIS, COMPLETE (UACMP) WITH MICROSCOPIC
Bilirubin Urine: NEGATIVE
GLUCOSE, UA: 100 mg/dL — AB
HGB URINE DIPSTICK: NEGATIVE
Ketones, ur: 15 mg/dL — AB
Leukocytes, UA: NEGATIVE
NITRITE: NEGATIVE
PH: 6 (ref 5.0–8.0)
Protein, ur: NEGATIVE mg/dL

## 2017-04-17 LAB — CBC WITH DIFFERENTIAL (CANCER CENTER ONLY)
BASOS ABS: 0 10*3/uL (ref 0.0–0.1)
BASOS PCT: 0 %
EOS ABS: 0.1 10*3/uL (ref 0.0–0.5)
EOS PCT: 1 %
HCT: 42.5 % (ref 38.7–49.9)
HEMOGLOBIN: 14.5 g/dL (ref 13.0–17.1)
Lymphocytes Relative: 14 %
Lymphs Abs: 0.9 10*3/uL (ref 0.9–3.3)
MCH: 30.3 pg (ref 28.0–33.4)
MCHC: 34.1 g/dL (ref 32.0–35.9)
MCV: 88.9 fL (ref 82.0–98.0)
Monocytes Absolute: 0.4 10*3/uL (ref 0.1–0.9)
Monocytes Relative: 5 %
NEUTROS PCT: 80 %
Neutro Abs: 5.2 10*3/uL (ref 1.5–6.5)
PLATELETS: 178 10*3/uL (ref 145–400)
RBC: 4.78 MIL/uL (ref 4.20–5.70)
RDW: 15.6 % (ref 11.1–15.7)
WBC: 6.5 10*3/uL (ref 4.0–10.0)

## 2017-04-17 MED ORDER — SULFAMETHOXAZOLE-TRIMETHOPRIM 800-160 MG PO TABS: 1.0000 | ORAL_TABLET | Freq: Two times a day (BID) | ORAL | 0 refills | Status: DC

## 2017-04-17 MED ORDER — ESCITALOPRAM OXALATE 10 MG PO TABS: 10.0000 mg | ORAL_TABLET | Freq: Every day | ORAL | 2 refills | Status: DC

## 2017-04-17 NOTE — Progress Notes (Signed)
Hematology and Oncology Follow Up Visit  Aaron Cunningham [id number] July 26, 1965 52 y.o. 04/17/2017   Principle Diagnosis:  1. 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 History of DVT right lower extremity and PE 3. 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 S/p Thrombolysis through bilateral lower extremity infusion catheters 2. Arixtra 7.5 mg SQ daily - prefers this to oral anticoagulation, history of GI bleed on oral -lifelong 3. Phlebotomy to maintain hematocrit below 45%   Interim History:  Aaron Cunningham is here today with complaint of blood in his urine. He is quite jittery and jumpy today. He is scratching and rubbing his arms while we talk. He denies using any recreation drugs but does state he drinks liquor in the evenings.  He is under stress with work and since his wife passed away with breast cancer a couple years ago. He admits to feeling depressed.  He has fatigue at times.  He has not picked up and started the iron supplement yet.  There are scratch marks on his bach with several small red raised bumps.  He is still vaping with 2% nicotine. No cigarettes.  The tingling in his fingers and right lower extremity is unchanged. No swelling, tenderness, numbness in his extremities.  His appetite continues to come and go. He is hydrating and his weight is stable.   ECOG Performance Status: 1 - Symptomatic but completely ambulatory  Medications:  Allergies as of 04/17/2017   No Known Allergies     Medication List        Accurate as of 04/17/17 11:06 AM. Always use your most recent med list.          BELSOMRA 10 MG Tabs Generic drug:  Suvorexant TAKE ONE TABLET BY MOUTH AT BEDTIME AS NEEDED   folic acid 1 MG tablet Commonly known as:  FOLVITE Take 1 tablet (1 mg total) by mouth daily.   fondaparinux 7.5 MG/0.6ML Soln injection Commonly known as:  ARIXTRA INJECT THE  CONTENTS OF ONE SYRINGE SUBCUTANEOUSLY ONCE DAILY   INTEGRA PLUS Caps Take 1 capsule by mouth daily.   olmesartan 40 MG tablet Commonly known as:  BENICAR Take 1 tablet (40 mg total) by mouth daily.   omeprazole 20 MG capsule Commonly known as:  PRILOSEC Take 20 mg by mouth daily.   PROAIR HFA 108 (90 Base) MCG/ACT inhaler Generic drug:  albuterol inhale TWO PUFF EVERY 4 TO 6 HOURS AS NEEDED FOR coughing/ wheezing/ SHORTNESS OF BREATH   tamsulosin 0.4 MG Caps capsule Commonly known as:  FLOMAX TAKE ONE CAPSULE BY MOUTH EVERY DAY   traMADol 50 MG tablet Commonly known as:  ULTRAM TAKE ONE TABLET BY MOUTH EVERY 6 HOURS AS NEEDED       Allergies: No Known Allergies  Past Medical History, Surgical history, Social history, and Family History were reviewed and updated.  Review of Systems: All other 10 point review of systems is negative.   Physical Exam:  vitals were not taken for this visit.   Wt Readings from Last 3 Encounters:  04/05/17 154 lb 1.9 oz (69.9 kg)  01/20/17 153 lb 6.4 oz (69.6 kg)  10/14/16 154 lb (69.9 kg)    Ocular: Sclerae unicteric, pupils equal, round and reactive to light Ear-nose-throat: Oropharynx clear, dentition fair Lymphatic: No cervical, supraclavicular or axillary adenopathy Lungs no rales or rhonchi, good excursion bilaterally Heart regular rate and rhythm,  no murmur appreciated Abd soft, nontender, positive bowel sounds, no liver or spleen tip palpated on exam, no fluid wave  MSK no focal spinal tenderness, no joint edema Neuro: non-focal, well-oriented, appropriate affect Breasts: Deferred   Lab Results  Component Value Date   WBC 8.6 04/05/2017   HGB 15.4 01/20/2017   HCT 43.8 04/05/2017   MCV 91.3 04/05/2017   PLT 186 04/05/2017   Lab Results  Component Value Date   FERRITIN 14 (L) 04/05/2017   IRON 86 04/05/2017   TIBC 449 (H) 04/05/2017   UIBC 363 04/05/2017   IRONPCTSAT 19 (L) 04/05/2017   Lab Results  Component  Value Date   RBC 4.80 04/05/2017   No results found for: KPAFRELGTCHN, LAMBDASER, KAPLAMBRATIO No results found for: IGGSERUM, IGA, IGMSERUM No results found for: Georgann Housekeeper, MSPIKE, SPEI   Chemistry      Component Value Date/Time   NA 142 04/05/2017 1155   NA 145 01/20/2017 1113   NA 143 02/19/2016 0952   K 3.9 04/05/2017 1155   K 3.7 01/20/2017 1113   K 3.9 02/19/2016 0952   CL 104 04/05/2017 1155   CL 103 01/20/2017 1113   CO2 26 04/05/2017 1155   CO2 30 01/20/2017 1113   CO2 28 02/19/2016 0952   BUN 9 04/05/2017 1155   BUN 7 01/20/2017 1113   BUN 8.3 02/19/2016 0952   CREATININE 1.14 04/05/2017 1155   CREATININE 0.9 01/20/2017 1113   CREATININE 1.0 02/19/2016 0952      Component Value Date/Time   CALCIUM 9.0 04/05/2017 1155   CALCIUM 9.5 01/20/2017 1113   CALCIUM 9.0 02/19/2016 0952   ALKPHOS 67 04/05/2017 1155   ALKPHOS 59 01/20/2017 1113   ALKPHOS 122 02/19/2016 0952   AST 33 04/05/2017 1155   AST 51 (H) 02/19/2016 0952   ALT 23 04/05/2017 1155   ALT 28 01/20/2017 1113   ALT 47 02/19/2016 0952   BILITOT 0.5 04/05/2017 1155   BILITOT 0.69 02/19/2016 0952      Impression and Plan: Aaron Cunningham is a pleasant 52 yo caucasian gentleman with recurrent lower extremity DVT. IVC filter is in place and he is on lifelong anticoagulation with Arixtra. He is here today with c/o blood in his urine and is quite jittery and jumpy.  UA showed no blood in the urine but he does have a UTI. Urine sent for culture. We will go ahead and get him on Bactrim DS BID for the next 3 days.  He also verbalized feeling stressed and depressed. He denies thoughts of harming himself or others. I spoke with Dr. Myna Hidalgo and we will have him start Lexapro 10 mg PO daily. I will forward today's note to his PCP, Dr. Dina Rich, so he can follow up and manage this new medication.  WE will plan to see him back in another 6 weeks for his previously  scheduled follow-up.  He promises to contact our office with any questions or concerns. We can certainly see him sooner if need be.   Emeline Gins, NP 4/1/201911:06 AM

## 2017-04-18 LAB — URINE CULTURE: Culture: NO GROWTH

## 2017-05-04 ENCOUNTER — Other Ambulatory Visit: Payer: Self-pay | Admitting: Family

## 2017-05-04 DIAGNOSIS — I2692 Saddle embolus of pulmonary artery without acute cor pulmonale: Secondary | ICD-10-CM

## 2017-05-04 DIAGNOSIS — I82413 Acute embolism and thrombosis of femoral vein, bilateral: Secondary | ICD-10-CM

## 2017-05-04 DIAGNOSIS — I824Z2 Acute embolism and thrombosis of unspecified deep veins of left distal lower extremity: Secondary | ICD-10-CM

## 2017-06-08 ENCOUNTER — Ambulatory Visit: Payer: BC Managed Care – PPO | Admitting: Family

## 2017-06-08 ENCOUNTER — Ambulatory Visit (HOSPITAL_COMMUNITY): Payer: BC Managed Care – PPO

## 2017-06-08 ENCOUNTER — Ambulatory Visit (HOSPITAL_COMMUNITY): Admission: RE | Admit: 2017-06-08 | Payer: BC Managed Care – PPO | Source: Ambulatory Visit

## 2017-06-29 ENCOUNTER — Inpatient Hospital Stay: Payer: BC Managed Care – PPO | Admitting: Family

## 2017-06-29 ENCOUNTER — Inpatient Hospital Stay: Payer: BC Managed Care – PPO

## 2017-06-30 ENCOUNTER — Other Ambulatory Visit: Payer: BC Managed Care – PPO

## 2017-06-30 ENCOUNTER — Ambulatory Visit: Payer: BC Managed Care – PPO | Admitting: Family

## 2017-07-04 ENCOUNTER — Other Ambulatory Visit: Payer: Self-pay

## 2017-07-04 ENCOUNTER — Inpatient Hospital Stay: Payer: BC Managed Care – PPO | Attending: Hematology & Oncology

## 2017-07-04 ENCOUNTER — Inpatient Hospital Stay: Payer: BC Managed Care – PPO

## 2017-07-04 ENCOUNTER — Other Ambulatory Visit: Payer: Self-pay | Admitting: Hematology & Oncology

## 2017-07-04 ENCOUNTER — Inpatient Hospital Stay (HOSPITAL_BASED_OUTPATIENT_CLINIC_OR_DEPARTMENT_OTHER): Payer: BC Managed Care – PPO | Admitting: Family

## 2017-07-04 VITALS — BP 113/68 | HR 101 | Resp 18

## 2017-07-04 VITALS — BP 140/95 | HR 105 | Temp 98.3°F | Resp 20 | Wt 144.0 lb

## 2017-07-04 DIAGNOSIS — Z79899 Other long term (current) drug therapy: Secondary | ICD-10-CM | POA: Insufficient documentation

## 2017-07-04 DIAGNOSIS — D751 Secondary polycythemia: Secondary | ICD-10-CM | POA: Insufficient documentation

## 2017-07-04 DIAGNOSIS — Z7901 Long term (current) use of anticoagulants: Secondary | ICD-10-CM

## 2017-07-04 DIAGNOSIS — I82403 Acute embolism and thrombosis of unspecified deep veins of lower extremity, bilateral: Secondary | ICD-10-CM

## 2017-07-04 DIAGNOSIS — I82513 Chronic embolism and thrombosis of femoral vein, bilateral: Secondary | ICD-10-CM

## 2017-07-04 DIAGNOSIS — R51 Headache: Secondary | ICD-10-CM | POA: Diagnosis not present

## 2017-07-04 DIAGNOSIS — D500 Iron deficiency anemia secondary to blood loss (chronic): Secondary | ICD-10-CM

## 2017-07-04 DIAGNOSIS — Z95828 Presence of other vascular implants and grafts: Secondary | ICD-10-CM | POA: Insufficient documentation

## 2017-07-04 DIAGNOSIS — D696 Thrombocytopenia, unspecified: Secondary | ICD-10-CM

## 2017-07-04 DIAGNOSIS — D582 Other hemoglobinopathies: Secondary | ICD-10-CM

## 2017-07-04 DIAGNOSIS — D509 Iron deficiency anemia, unspecified: Secondary | ICD-10-CM

## 2017-07-04 DIAGNOSIS — I824Z2 Acute embolism and thrombosis of unspecified deep veins of left distal lower extremity: Secondary | ICD-10-CM

## 2017-07-04 LAB — CMP (CANCER CENTER ONLY)
ALBUMIN: 3.9 g/dL (ref 3.5–5.0)
ALK PHOS: 79 U/L (ref 26–84)
ALT: 113 U/L — ABNORMAL HIGH (ref 10–47)
ANION GAP: 14 (ref 5–15)
AST: 98 U/L — ABNORMAL HIGH (ref 11–38)
BILIRUBIN TOTAL: 0.7 mg/dL (ref 0.2–1.6)
BUN: 17 mg/dL (ref 7–22)
CHLORIDE: 99 mmol/L (ref 98–108)
CO2: 32 mmol/L (ref 18–33)
Calcium: 9.7 mg/dL (ref 8.0–10.3)
Creatinine: 1.3 mg/dL — ABNORMAL HIGH (ref 0.60–1.20)
Glucose, Bld: 93 mg/dL (ref 73–118)
POTASSIUM: 3.8 mmol/L (ref 3.3–4.7)
Sodium: 145 mmol/L (ref 128–145)
Total Protein: 6.8 g/dL (ref 6.4–8.1)

## 2017-07-04 LAB — CBC WITH DIFFERENTIAL (CANCER CENTER ONLY)
Basophils Absolute: 0 10*3/uL (ref 0.0–0.1)
Basophils Relative: 0 %
EOS ABS: 0.1 10*3/uL (ref 0.0–0.5)
EOS PCT: 2 %
HEMATOCRIT: 45.8 % (ref 38.7–49.9)
HEMOGLOBIN: 16.3 g/dL (ref 13.0–17.1)
LYMPHS PCT: 22 %
Lymphs Abs: 0.9 10*3/uL (ref 0.9–3.3)
MCH: 35.7 pg — AB (ref 28.0–33.4)
MCHC: 35.6 g/dL (ref 32.0–35.9)
MCV: 100.2 fL — ABNORMAL HIGH (ref 82.0–98.0)
Monocytes Absolute: 0.3 10*3/uL (ref 0.1–0.9)
Monocytes Relative: 9 %
Neutro Abs: 2.7 10*3/uL (ref 1.5–6.5)
Neutrophils Relative %: 67 %
Platelet Count: 137 10*3/uL — ABNORMAL LOW (ref 145–400)
RBC: 4.57 MIL/uL (ref 4.20–5.70)
RDW: 15.1 % (ref 11.1–15.7)
WBC: 4 10*3/uL (ref 4.0–10.0)

## 2017-07-04 LAB — LACTATE DEHYDROGENASE: LDH: 210 U/L (ref 125–245)

## 2017-07-04 NOTE — Patient Instructions (Signed)

## 2017-07-04 NOTE — Progress Notes (Signed)
Aaron Cunningham presents today for phlebotomy per MD orders. Phlebotomy procedure started at 1324 and ended at 1329. 520 cc removed via 16 G needle at L antecubital site. Patient's VS abnormal after 30 min post phlebotomy. States he feels "lightheaded". Starleen BlueSarah [place], NP aware. Oral fluids offered. Will continue to monitor. At 1217 patient stated: am feeling much better". BP WNL, HR 101. Patient discharged stable and ASX.

## 2017-07-04 NOTE — Progress Notes (Signed)
Hematology and Oncology Follow Up Visit  Aaron Cunningham [id number] 1965/02/01 52 y.o. 07/04/2017   Principle Diagnosis:  1. 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 History of DVT right lower extremity and PE 3. 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 S/p Thrombolysis through bilateral lower extremity infusion catheters 2. Arixtra 7.5 mg SQ daily - prefers this to oral anticoagulation, history of GI bleed on oral -lifelong 3. Phlebotomy to maintain hematocrit below 45%   Interim History: Aaron Cunningham is here today for follow-up. He is feeling fatigued and has had a few episodes of palpitations. He states that he will itch all over after a hot shower.  He is not smoking cigarettes but still vaping. He has a dry cough that comes and goes.  He has headaches off and on. No blurred vision.  No falls or syncopal episodes.  No fever, chills, n/v, rash, dizziness, SOB, chest pain, palpitations, abdominal pain or changes in bowel or bladder habits.  No swelling in his extremities at this time. He still has numbness and tingling in his hands. This is unchanged.  He is still feeling anxious on Lexapro and has not followed up with Dr. Sol Passer yet. He states he will contact him this week.  He does not have much of an appetite but is staying well hydrated. His weight is stable.   ECOG Performance Status: 1 - Symptomatic but completely ambulatory  Medications:  Allergies as of 07/04/2017   No Known Allergies     Medication List        Accurate as of 07/04/17 12:57 PM. Always use your most recent med list.          BELSOMRA 10 MG Tabs Generic drug:  Suvorexant TAKE ONE TABLET BY MOUTH AT BEDTIME AS NEEDED   escitalopram 10 MG tablet Commonly known as:  LEXAPRO Take 1 tablet (10 mg total) by mouth daily.   ferrous sulfate 325 (65 FE) MG tablet Take 325 mg by mouth daily with  breakfast.   folic acid 1 MG tablet Commonly known as:  FOLVITE Take 1 tablet (1 mg total) by mouth daily.   fondaparinux 7.5 MG/0.6ML Soln injection Commonly known as:  ARIXTRA INJECT THE CONTENTS OF ONE SYRINGE SUBCUTANEOUSLY ONCE DAILY   INTEGRA PLUS Caps Take 1 capsule by mouth daily.   olmesartan 40 MG tablet Commonly known as:  BENICAR Take 1 tablet (40 mg total) by mouth daily.   omeprazole 20 MG capsule Commonly known as:  PRILOSEC Take 20 mg by mouth daily.   ondansetron 8 MG disintegrating tablet Commonly known as:  ZOFRAN-ODT TAKE ONE TABLET BY MOUTH EVERY 8 HOURS AS NEEDED FOR UP TO 5 DAYS FOR NAUSEA   PROAIR HFA 108 (90 Base) MCG/ACT inhaler Generic drug:  albuterol inhale TWO PUFF EVERY 4 TO 6 HOURS AS NEEDED FOR coughing/ wheezing/ SHORTNESS OF BREATH   prochlorperazine 10 MG tablet Commonly known as:  COMPAZINE   tamsulosin 0.4 MG Caps capsule Commonly known as:  FLOMAX TAKE ONE CAPSULE BY MOUTH EVERY DAY   traMADol 50 MG tablet Commonly known as:  ULTRAM TAKE ONE TABLET BY MOUTH EVERY 6 HOURS AS NEEDED       Allergies: No Known Allergies  Past Medical History, Surgical history, Social history, and Family History were reviewed and updated.  Review of Systems: All other 10 point review of systems is negative.  Physical Exam:  weight is 144 lb (65.3 kg). His oral temperature is 98.3 F (36.8 C). His blood pressure is 140/95 (abnormal) and his pulse is 105 (abnormal). His respiration is 20 and oxygen saturation is 100%.   Wt Readings from Last 3 Encounters:  07/04/17 144 lb (65.3 kg)  04/17/17 154 lb (69.9 kg)  04/05/17 154 lb 1.9 oz (69.9 kg)    Ocular: Sclerae unicteric, pupils equal, round and reactive to light Ear-nose-throat: Oropharynx clear, dentition fair Lymphatic: No cervical, supraclavicular or axillary adenopathy Lungs no rales or rhonchi, good excursion bilaterally Heart regular rate and rhythm, no murmur appreciated Abd  soft, nontender, positive bowel sounds, no liver or spleen tip palpated on exam, no fluid wave  MSK no focal spinal tenderness, no joint edema Neuro: non-focal, well-oriented, appropriate affect Breasts: Deferred   Lab Results  Component Value Date   WBC 4.0 07/04/2017   HGB 16.3 07/04/2017   HCT 45.8 07/04/2017   MCV 100.2 (H) 07/04/2017   PLT 137 (L) 07/04/2017   Lab Results  Component Value Date   FERRITIN 14 (L) 04/05/2017   IRON 86 04/05/2017   TIBC 449 (H) 04/05/2017   UIBC 363 04/05/2017   IRONPCTSAT 19 (L) 04/05/2017   Lab Results  Component Value Date   RBC 4.57 07/04/2017   No results found for: KPAFRELGTCHN, LAMBDASER, KAPLAMBRATIO No results found for: IGGSERUM, IGA, IGMSERUM No results found for: Georgann HousekeeperOTALPROTELP, ALBUMINELP, A1GS, A2GS, BETS, BETA2SER, GAMS, MSPIKE, SPEI   Chemistry      Component Value Date/Time   NA 145 07/04/2017 1142   NA 145 01/20/2017 1113   NA 143 02/19/2016 0952   K 3.8 07/04/2017 1142   K 3.7 01/20/2017 1113   K 3.9 02/19/2016 0952   CL 99 07/04/2017 1142   CL 103 01/20/2017 1113   CO2 32 07/04/2017 1142   CO2 30 01/20/2017 1113   CO2 28 02/19/2016 0952   BUN 17 07/04/2017 1142   BUN 7 01/20/2017 1113   BUN 8.3 02/19/2016 0952   CREATININE 1.30 (H) 07/04/2017 1142   CREATININE 0.9 01/20/2017 1113   CREATININE 1.0 02/19/2016 0952      Component Value Date/Time   CALCIUM 9.7 07/04/2017 1142   CALCIUM 9.5 01/20/2017 1113   CALCIUM 9.0 02/19/2016 0952   ALKPHOS 79 07/04/2017 1142   ALKPHOS 59 01/20/2017 1113   ALKPHOS 122 02/19/2016 0952   AST 98 (H) 07/04/2017 1142   AST 51 (H) 02/19/2016 0952   ALT 113 (H) 07/04/2017 1142   ALT 28 01/20/2017 1113   ALT 47 02/19/2016 0952   BILITOT 0.7 07/04/2017 1142   BILITOT 0.69 02/19/2016 0952      Impression and Plan: Aaron Cunningham is a pleasant 52 yo caucasian gentleman with recurrent lower extremity DVT. He has an IVC filter and is on lifelong anticoagulation with Arixtra. He is  doing well on anticoagulation and so far has had no other recurrence.  His Hct is 45.8% and he is symptomatic with headaches, palpitations and itching after a hot shower.  We will proceed with phlebotomy today as planned.  We will plan to see him back in another 6 weeks for follow-up.  He will contact our office with any questions or concerns. We can certainly see her sooner if need be.   Emeline GinsSarah Jahnia Hewes, NP 6/18/201912:57 PM

## 2017-07-05 LAB — IRON AND TIBC
Iron: 161 ug/dL (ref 42–163)
Saturation Ratios: 55 % (ref 42–163)
TIBC: 291 ug/dL (ref 202–409)
UIBC: 131 ug/dL

## 2017-07-05 LAB — FERRITIN: FERRITIN: 335 ng/mL — AB (ref 22–316)

## 2017-07-18 ENCOUNTER — Other Ambulatory Visit: Payer: Self-pay | Admitting: Hematology & Oncology

## 2017-07-18 DIAGNOSIS — I82409 Acute embolism and thrombosis of unspecified deep veins of unspecified lower extremity: Secondary | ICD-10-CM

## 2017-07-27 ENCOUNTER — Other Ambulatory Visit: Payer: Self-pay | Admitting: Family

## 2017-07-27 DIAGNOSIS — F419 Anxiety disorder, unspecified: Secondary | ICD-10-CM

## 2017-08-14 DIAGNOSIS — J209 Acute bronchitis, unspecified: Secondary | ICD-10-CM | POA: Insufficient documentation

## 2017-08-14 HISTORY — DX: Acute bronchitis, unspecified: J20.9

## 2017-08-15 ENCOUNTER — Inpatient Hospital Stay: Payer: BC Managed Care – PPO

## 2017-08-15 ENCOUNTER — Inpatient Hospital Stay: Payer: BC Managed Care – PPO | Attending: Hematology & Oncology | Admitting: Family

## 2017-08-15 ENCOUNTER — Other Ambulatory Visit: Payer: Self-pay

## 2017-08-15 ENCOUNTER — Encounter: Payer: Self-pay | Admitting: Family

## 2017-08-15 VITALS — BP 154/76 | HR 71 | Temp 98.4°F | Wt 150.0 lb

## 2017-08-15 DIAGNOSIS — D751 Secondary polycythemia: Secondary | ICD-10-CM

## 2017-08-15 DIAGNOSIS — I82513 Chronic embolism and thrombosis of femoral vein, bilateral: Secondary | ICD-10-CM

## 2017-08-15 DIAGNOSIS — D509 Iron deficiency anemia, unspecified: Secondary | ICD-10-CM

## 2017-08-15 DIAGNOSIS — I824Z2 Acute embolism and thrombosis of unspecified deep veins of left distal lower extremity: Secondary | ICD-10-CM | POA: Diagnosis not present

## 2017-08-15 LAB — CMP (CANCER CENTER ONLY)
ALBUMIN: 3.5 g/dL (ref 3.5–5.0)
ALT: 26 U/L (ref 10–47)
AST: 27 U/L (ref 11–38)
Alkaline Phosphatase: 50 U/L (ref 26–84)
Anion gap: 12 (ref 5–15)
BUN: 10 mg/dL (ref 7–22)
CHLORIDE: 100 mmol/L (ref 98–108)
CO2: 29 mmol/L (ref 18–33)
Calcium: 9.2 mg/dL (ref 8.0–10.3)
Creatinine: 1 mg/dL (ref 0.60–1.20)
Glucose, Bld: 147 mg/dL — ABNORMAL HIGH (ref 73–118)
POTASSIUM: 3.3 mmol/L (ref 3.3–4.7)
SODIUM: 141 mmol/L (ref 128–145)
Total Bilirubin: 0.9 mg/dL (ref 0.2–1.6)
Total Protein: 6.1 g/dL — ABNORMAL LOW (ref 6.4–8.1)

## 2017-08-15 LAB — CBC WITH DIFFERENTIAL (CANCER CENTER ONLY)
BASOS PCT: 1 %
Basophils Absolute: 0 10*3/uL (ref 0.0–0.1)
EOS ABS: 0.7 10*3/uL — AB (ref 0.0–0.5)
Eosinophils Relative: 17 %
HCT: 42.6 % (ref 38.7–49.9)
Hemoglobin: 14.9 g/dL (ref 13.0–17.1)
LYMPHS ABS: 1.4 10*3/uL (ref 0.9–3.3)
Lymphocytes Relative: 34 %
MCH: 36.7 pg — AB (ref 28.0–33.4)
MCHC: 35 g/dL (ref 32.0–35.9)
MCV: 104.9 fL — ABNORMAL HIGH (ref 82.0–98.0)
Monocytes Absolute: 0.3 10*3/uL (ref 0.1–0.9)
Monocytes Relative: 8 %
NEUTROS PCT: 40 %
Neutro Abs: 1.6 10*3/uL (ref 1.5–6.5)
Platelet Count: 255 10*3/uL (ref 145–400)
RBC: 4.06 MIL/uL — AB (ref 4.20–5.70)
RDW: 11.6 % (ref 11.1–15.7)
WBC: 3.9 10*3/uL — AB (ref 4.0–10.0)

## 2017-08-15 LAB — LACTATE DEHYDROGENASE: LDH: 183 U/L (ref 98–192)

## 2017-08-15 NOTE — Progress Notes (Signed)
Hematology and Oncology Follow Up Visit  [place] Falls[place] Scott Cunningham [id number] 01/24/1965 52 y.o. 08/15/2017   Principle Diagnosis:  1. 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 History of DVT right lower extremity and PE 3. 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 S/p Thrombolysis through bilateral lower extremity infusion catheters 2. Arixtra 7.5 mg SQ daily - prefers this to oral anticoagulation, history of GI bleed on oral -lifelong 3. Phlebotomy to maintain hematocrit below 45%   Interim History:  [name] is here today for follow-up. He is doing well but is still under a lot of stress with work and has some intermittent fatigue.  He continues to do well on Arixtra and has had no episodes of bleeding, no bruising or petechiae.  Hct is stable at 42.6%.  He has had no falls or syncopal episodes.  He has bronchitis and is currently on an antibiotic and inhaler which have helped. His lung sounds today are clear. He has a nonproductive cough and some mild SOB with over exertion.  He stopped smoking almost 5 months ago.  No fever, chills, n/v, cough, rash, dizziness, chest pain, palpitations, abdominal pain or changes in bowel or bladder habits.  The numbness and tingling in his hands is unchanged. No swelling in his extremities at this time.  He continues to wear his compression stockings when he knows he is going to be on his feet for an extended period of time.  He has a good appetite and is staying well hydrated. His weight is stable.   ECOG Performance Status: 1 - Symptomatic but completely ambulatory  Medications:  Allergies as of 08/15/2017   No Known Allergies     Medication List        Accurate as of 08/15/17  3:28 PM. Always use your most recent med list.          BELSOMRA 10 MG Tabs Generic drug:  Suvorexant TAKE ONE TABLET BY MOUTH AT BEDTIME AS NEEDED     escitalopram 10 MG tablet Commonly known as:  LEXAPRO Take 1 tablet (10 mg total) by mouth daily.   ferrous sulfate 325 (65 FE) MG tablet Take 325 mg by mouth daily with breakfast.   folic acid 1 MG tablet Commonly known as:  FOLVITE Take 1 tablet (1 mg total) by mouth daily.   fondaparinux 7.5 MG/0.6ML Soln injection Commonly known as:  ARIXTRA INJECT THE CONTENTS OF 1 SYRINGE UNDER THE SKIN ONCE DAILY   INTEGRA PLUS Caps Take 1 capsule by mouth daily.   olmesartan 40 MG tablet Commonly known as:  BENICAR Take 1 tablet (40 mg total) by mouth daily.   omeprazole 20 MG capsule Commonly known as:  PRILOSEC Take 20 mg by mouth daily.   PROAIR HFA 108 (90 Base) MCG/ACT inhaler Generic drug:  albuterol inhale TWO PUFF EVERY 4 TO 6 HOURS AS NEEDED FOR coughing/ wheezing/ SHORTNESS OF BREATH   prochlorperazine 10 MG tablet Commonly known as:  COMPAZINE   tamsulosin 0.4 MG Caps capsule Commonly known as:  FLOMAX TAKE ONE CAPSULE BY MOUTH EVERY DAY   traMADol 50 MG tablet Commonly known as:  ULTRAM TAKE ONE TABLET BY MOUTH EVERY 6 HOURS AS NEEDED       Allergies: No Known Allergies  Past Medical History, Surgical history, Social history, and Family History were reviewed and updated.  Review of Systems: All other 10 point review  of systems is negative.   Physical Exam:  weight is 150 lb (68 kg). His oral temperature is 98.4 F (36.9 C). His blood pressure is 154/76 (abnormal) and his pulse is 71. His oxygen saturation is 97%.   Wt Readings from Last 3 Encounters:  08/15/17 150 lb (68 kg)  07/04/17 144 lb (65.3 kg)  04/17/17 154 lb (69.9 kg)    Ocular: Sclerae unicteric, pupils equal, round and reactive to light Ear-nose-throat: Oropharynx clear, dentition fair Lymphatic: No cervical, supraclavicular or axillary adenopathy Lungs no rales or rhonchi, good excursion bilaterally Heart regular rate and rhythm, no murmur appreciated Abd soft, nontender, positive  bowel sounds, no liver or spleen tip palpated on exam, no fluid wave  MSK no focal spinal tenderness, no joint edema Neuro: non-focal, well-oriented, appropriate affect Breasts: Deferred   Lab Results  Component Value Date   WBC 3.9 (L) 08/15/2017   HGB 14.9 08/15/2017   HCT 42.6 08/15/2017   MCV 104.9 (H) 08/15/2017   PLT 255 08/15/2017   Lab Results  Component Value Date   FERRITIN 335 (H) 07/04/2017   IRON 161 07/04/2017   TIBC 291 07/04/2017   UIBC 131 07/04/2017   IRONPCTSAT 55 07/04/2017   Lab Results  Component Value Date   RBC 4.06 (L) 08/15/2017   No results found for: KPAFRELGTCHN, LAMBDASER, KAPLAMBRATIO No results found for: IGGSERUM, IGA, IGMSERUM No results found for: Georgann Housekeeper, MSPIKE, SPEI   Chemistry      Component Value Date/Time   NA 141 08/15/2017 1044   NA 145 01/20/2017 1113   NA 143 02/19/2016 0952   K 3.3 08/15/2017 1044   K 3.7 01/20/2017 1113   K 3.9 02/19/2016 0952   CL 100 08/15/2017 1044   CL 103 01/20/2017 1113   CO2 29 08/15/2017 1044   CO2 30 01/20/2017 1113   CO2 28 02/19/2016 0952   BUN 10 08/15/2017 1044   BUN 7 01/20/2017 1113   BUN 8.3 02/19/2016 0952   CREATININE 1.00 08/15/2017 1044   CREATININE 0.9 01/20/2017 1113   CREATININE 1.0 02/19/2016 0952      Component Value Date/Time   CALCIUM 9.2 08/15/2017 1044   CALCIUM 9.5 01/20/2017 1113   CALCIUM 9.0 02/19/2016 0952   ALKPHOS 50 08/15/2017 1044   ALKPHOS 59 01/20/2017 1113   ALKPHOS 122 02/19/2016 0952   AST 27 08/15/2017 1044   AST 51 (H) 02/19/2016 0952   ALT 26 08/15/2017 1044   ALT 28 01/20/2017 1113   ALT 47 02/19/2016 0952   BILITOT 0.9 08/15/2017 1044   BILITOT 0.69 02/19/2016 0952      Impression and Plan: [name] is a very pleasant 52 yo caucasian gentleman with recurrent lower extremity DVT. He has an IVC filter and is on lifelong anticoagulation with Arixtra. He continues to do well and has no  complaints at this time.  Hct is 42.6% so phlebotomy needed this visit.  We will see him back in another 8 weeks for follow-up.  He will contact our office with any questions or concerns. We can certainly see him sooner if need be.   Emeline Gins, NP 7/30/20193:28 PM

## 2017-08-16 LAB — IRON AND TIBC
IRON: 256 ug/dL — AB (ref 42–163)
Saturation Ratios: 95 % (ref 42–163)
TIBC: 269 ug/dL (ref 202–409)
UIBC: 13 ug/dL

## 2017-08-16 LAB — FERRITIN: FERRITIN: 77 ng/mL (ref 24–336)

## 2017-08-23 ENCOUNTER — Other Ambulatory Visit: Payer: Self-pay | Admitting: Hematology & Oncology

## 2017-08-23 DIAGNOSIS — I82409 Acute embolism and thrombosis of unspecified deep veins of unspecified lower extremity: Secondary | ICD-10-CM

## 2017-08-31 ENCOUNTER — Other Ambulatory Visit: Payer: Self-pay | Admitting: Family

## 2017-08-31 DIAGNOSIS — I824Z2 Acute embolism and thrombosis of unspecified deep veins of left distal lower extremity: Secondary | ICD-10-CM

## 2017-08-31 DIAGNOSIS — I2692 Saddle embolus of pulmonary artery without acute cor pulmonale: Secondary | ICD-10-CM

## 2017-08-31 DIAGNOSIS — I82413 Acute embolism and thrombosis of femoral vein, bilateral: Secondary | ICD-10-CM

## 2017-09-24 ENCOUNTER — Other Ambulatory Visit: Payer: Self-pay | Admitting: Hematology & Oncology

## 2017-09-24 DIAGNOSIS — I82409 Acute embolism and thrombosis of unspecified deep veins of unspecified lower extremity: Secondary | ICD-10-CM

## 2017-10-06 DIAGNOSIS — I10 Essential (primary) hypertension: Secondary | ICD-10-CM

## 2017-10-06 HISTORY — DX: Essential (primary) hypertension: I10

## 2017-10-09 ENCOUNTER — Inpatient Hospital Stay: Payer: BC Managed Care – PPO

## 2017-10-09 ENCOUNTER — Inpatient Hospital Stay: Payer: BC Managed Care – PPO | Attending: Hematology & Oncology | Admitting: Family

## 2017-10-09 ENCOUNTER — Other Ambulatory Visit: Payer: Self-pay | Admitting: Family

## 2017-10-09 VITALS — BP 120/71 | HR 77 | Temp 98.0°F | Resp 18 | Wt 152.0 lb

## 2017-10-09 DIAGNOSIS — Z86718 Personal history of other venous thrombosis and embolism: Secondary | ICD-10-CM

## 2017-10-09 DIAGNOSIS — Z7901 Long term (current) use of anticoagulants: Secondary | ICD-10-CM | POA: Diagnosis not present

## 2017-10-09 DIAGNOSIS — D751 Secondary polycythemia: Secondary | ICD-10-CM | POA: Diagnosis not present

## 2017-10-09 DIAGNOSIS — F17210 Nicotine dependence, cigarettes, uncomplicated: Secondary | ICD-10-CM | POA: Diagnosis not present

## 2017-10-09 DIAGNOSIS — I824Z2 Acute embolism and thrombosis of unspecified deep veins of left distal lower extremity: Secondary | ICD-10-CM

## 2017-10-09 DIAGNOSIS — D509 Iron deficiency anemia, unspecified: Secondary | ICD-10-CM

## 2017-10-09 DIAGNOSIS — I82513 Chronic embolism and thrombosis of femoral vein, bilateral: Secondary | ICD-10-CM

## 2017-10-09 LAB — CBC WITH DIFFERENTIAL (CANCER CENTER ONLY)
BASOS ABS: 0 10*3/uL (ref 0.0–0.1)
BASOS PCT: 0 %
Eosinophils Absolute: 0.1 10*3/uL (ref 0.0–0.5)
Eosinophils Relative: 4 %
HCT: 41 % (ref 38.7–49.9)
HEMOGLOBIN: 14.4 g/dL (ref 13.0–17.1)
LYMPHS PCT: 33 %
Lymphs Abs: 1.1 10*3/uL (ref 0.9–3.3)
MCH: 37 pg — ABNORMAL HIGH (ref 28.0–33.4)
MCHC: 35.1 g/dL (ref 32.0–35.9)
MCV: 105.4 fL — AB (ref 82.0–98.0)
MONOS PCT: 5 %
Monocytes Absolute: 0.2 10*3/uL (ref 0.1–0.9)
NEUTROS PCT: 58 %
Neutro Abs: 1.9 10*3/uL (ref 1.5–6.5)
Platelet Count: 128 10*3/uL — ABNORMAL LOW (ref 145–400)
RBC: 3.89 MIL/uL — ABNORMAL LOW (ref 4.20–5.70)
RDW: 11.3 % (ref 11.1–15.7)
WBC Count: 3.3 10*3/uL — ABNORMAL LOW (ref 4.0–10.0)

## 2017-10-09 LAB — CMP (CANCER CENTER ONLY)
ALBUMIN: 3.6 g/dL (ref 3.5–5.0)
ALT: 32 U/L (ref 10–47)
AST: 38 U/L (ref 11–38)
Alkaline Phosphatase: 59 U/L (ref 26–84)
Anion gap: 4 — ABNORMAL LOW (ref 5–15)
BUN: 12 mg/dL (ref 7–22)
CHLORIDE: 108 mmol/L (ref 98–108)
CO2: 29 mmol/L (ref 18–33)
Calcium: 9.1 mg/dL (ref 8.0–10.3)
Creatinine: 1.2 mg/dL (ref 0.60–1.20)
GLUCOSE: 144 mg/dL — AB (ref 73–118)
POTASSIUM: 3.8 mmol/L (ref 3.3–4.7)
Sodium: 141 mmol/L (ref 128–145)
Total Bilirubin: 0.7 mg/dL (ref 0.2–1.6)
Total Protein: 6.4 g/dL (ref 6.4–8.1)

## 2017-10-09 LAB — LACTATE DEHYDROGENASE: LDH: 188 U/L (ref 98–192)

## 2017-10-09 MED ORDER — FOLIC ACID 1 MG PO TABS: 1.0000 mg | ORAL_TABLET | Freq: Every day | ORAL | 6 refills | Status: DC

## 2017-10-09 NOTE — Progress Notes (Signed)
Hematology and Oncology Follow Up Visit  Aaron Cunningham [id number] 1965/10/31 52 y.o. 10/09/2017   Principle Diagnosis:  1. 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 History of DVT right lower extremity and PE 3. 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 S/p Thrombolysis through bilateral lower extremity infusion catheters 2. Arixtra 7.5 mg SQ daily, lifelong - prefers this to oral anticoagulation, history of GI bleed on oral 3. Phlebotomy to maintain hematocrit below 45%   Interim History:  Aaron Cunningham is here today for follow-up. He is doing well and has no complaints at this time.  His Hct is 41% so no phlebotomy needed this visit.  He is doing well on Arixtra and has had no episodes of bleeding. No bruising or petechiae.  No issues with infection. No fever, chills, n/v, cough, rash, dizziness, SOB, chest pain, palpitations, abdominal pain or changes in bowel or bladder habits.  He still is not smoking but is vaping.  He states that he saw his PCP recently for hypotension. They took him off Benicar and now he is on Lisinopril 5 mg PO daily, hold for diastolic less than 90. VSS.  No swelling or numbness in his extremities.  He has pain in the right lower extremity when he is on his feet for an extended period of time (12 hour shifts) and wears a compression stocking for support.  He is staying active and has been going golfing regularly.  He has tingling in his hands he feels is due to carpal tunnel syndrome.  He has a good appetite and is staying well hydrated. His weight is stable.   ECOG Performance Status: 1 - Symptomatic but completely ambulatory  Medications:  Allergies as of 10/09/2017   No Known Allergies     Medication List        Accurate as of 10/09/17 11:01 AM. Always use your most recent med list.          BELSOMRA 10 MG Tabs Generic drug:   Suvorexant TAKE ONE TABLET BY MOUTH AT BEDTIME AS NEEDED   escitalopram 10 MG tablet Commonly known as:  LEXAPRO Take 1 tablet (10 mg total) by mouth daily.   ferrous sulfate 325 (65 FE) MG tablet Take 325 mg by mouth daily with breakfast.   folic acid 1 MG tablet Commonly known as:  FOLVITE Take 1 tablet (1 mg total) by mouth daily.   fondaparinux 7.5 MG/0.6ML Soln injection Commonly known as:  ARIXTRA INJECT THE CONTENTS OF 1 SYRINGE UNDER THE SKIN ONCE DAILY   INTEGRA PLUS Caps Take 1 capsule by mouth daily.   lisinopril 5 MG tablet Commonly known as:  PRINIVIL,ZESTRIL Take 5 mg by mouth daily.   omeprazole 20 MG capsule Commonly known as:  PRILOSEC Take 20 mg by mouth daily.   PROAIR HFA 108 (90 Base) MCG/ACT inhaler Generic drug:  albuterol inhale TWO PUFF EVERY 4 TO 6 HOURS AS NEEDED FOR coughing/ wheezing/ SHORTNESS OF BREATH   prochlorperazine 10 MG tablet Commonly known as:  COMPAZINE   tamsulosin 0.4 MG Caps capsule Commonly known as:  FLOMAX TAKE ONE CAPSULE BY MOUTH EVERY DAY   traMADol 50 MG tablet Commonly known as:  ULTRAM TAKE ONE TABLET BY MOUTH EVERY 6 HOURS AS NEEDED       Allergies: No Known Allergies  Past Medical History, Surgical history, Social history, and Family History were reviewed and  updated.  Review of Systems: All other 10 point review of systems is negative.   Physical Exam:  weight is 152 lb (68.9 kg). His oral temperature is 98 F (36.7 C). His blood pressure is 120/71 and his pulse is 77. His respiration is 18.   Wt Readings from Last 3 Encounters:  10/09/17 152 lb (68.9 kg)  08/15/17 150 lb (68 kg)  07/04/17 144 lb (65.3 kg)    Ocular: Sclerae unicteric, pupils equal, round and reactive to light Ear-nose-throat: Oropharynx clear, dentition fair Lymphatic: No cervical, supraclavicular or axillary adenopathy Lungs no rales or rhonchi, good excursion bilaterally Heart regular rate and rhythm, no murmur  appreciated Abd soft, nontender, positive bowel sounds, no liver or spleen tip palpated on exam, no fluid wave  MSK no focal spinal tenderness, no joint edema Neuro: non-focal, well-oriented, appropriate affect Breasts: Deferred   Lab Results  Component Value Date   WBC 3.3 (L) 10/09/2017   HGB 14.4 10/09/2017   HCT 41.0 10/09/2017   MCV 105.4 (H) 10/09/2017   PLT 128 (L) 10/09/2017   Lab Results  Component Value Date   FERRITIN 77 08/15/2017   IRON 256 (H) 08/15/2017   TIBC 269 08/15/2017   UIBC 13 08/15/2017   IRONPCTSAT 95 08/15/2017   Lab Results  Component Value Date   RBC 3.89 (L) 10/09/2017   No results found for: KPAFRELGTCHN, LAMBDASER, KAPLAMBRATIO No results found for: IGGSERUM, IGA, IGMSERUM No results found for: Marda StalkerOTALPROTELP, ALBUMINELP, A1GS, A2GS, BETS, BETA2SER, GAMS, MSPIKE, SPEI   Chemistry      Component Value Date/Time   NA 141 08/15/2017 1044   NA 145 01/20/2017 1113   NA 143 02/19/2016 0952   K 3.3 08/15/2017 1044   K 3.7 01/20/2017 1113   K 3.9 02/19/2016 0952   CL 100 08/15/2017 1044   CL 103 01/20/2017 1113   CO2 29 08/15/2017 1044   CO2 30 01/20/2017 1113   CO2 28 02/19/2016 0952   BUN 10 08/15/2017 1044   BUN 7 01/20/2017 1113   BUN 8.3 02/19/2016 0952   CREATININE 1.00 08/15/2017 1044   CREATININE 0.9 01/20/2017 1113   CREATININE 1.0 02/19/2016 0952      Component Value Date/Time   CALCIUM 9.2 08/15/2017 1044   CALCIUM 9.5 01/20/2017 1113   CALCIUM 9.0 02/19/2016 0952   ALKPHOS 50 08/15/2017 1044   ALKPHOS 59 01/20/2017 1113   ALKPHOS 122 02/19/2016 0952   AST 27 08/15/2017 1044   AST 51 (H) 02/19/2016 0952   ALT 26 08/15/2017 1044   ALT 28 01/20/2017 1113   ALT 47 02/19/2016 0952   BILITOT 0.9 08/15/2017 1044   BILITOT 0.69 02/19/2016 0952      Impression and Plan: Aaron Cunningham is a pleasant 52 yo caucasian gentleman with recurrent lower extremity DVT with IVC filter in place and lifelong anticoagulation with Arixtra. He  also had secondary polycythemia due to smoking which seems to have resolved since he quit.  Hct today is 41% so no phlebotomy needed this visit.  We will plan to see him back in another 3 months for follow-up.  He will contact our office with any questions or concerns. We can certainly see him sooner if need be.   Emeline GinsSarah [place], NP 9/23/201911:01 AM

## 2017-10-10 LAB — IRON AND TIBC
Iron: 231 ug/dL — ABNORMAL HIGH (ref 42–163)
Saturation Ratios: 80 % (ref 42–163)
TIBC: 289 ug/dL (ref 202–409)
UIBC: 58 ug/dL

## 2017-10-10 LAB — FERRITIN: Ferritin: 182 ng/mL (ref 24–336)

## 2017-11-01 ENCOUNTER — Other Ambulatory Visit: Payer: Self-pay | Admitting: Hematology & Oncology

## 2017-11-01 DIAGNOSIS — I82409 Acute embolism and thrombosis of unspecified deep veins of unspecified lower extremity: Secondary | ICD-10-CM

## 2017-12-05 DIAGNOSIS — Z8249 Family history of ischemic heart disease and other diseases of the circulatory system: Secondary | ICD-10-CM | POA: Insufficient documentation

## 2017-12-05 DIAGNOSIS — F331 Major depressive disorder, recurrent, moderate: Secondary | ICD-10-CM | POA: Insufficient documentation

## 2017-12-05 HISTORY — DX: Major depressive disorder, recurrent, moderate: F33.1

## 2017-12-05 HISTORY — DX: Family history of ischemic heart disease and other diseases of the circulatory system: Z82.49

## 2017-12-25 ENCOUNTER — Other Ambulatory Visit: Payer: Self-pay | Admitting: Hematology & Oncology

## 2017-12-25 DIAGNOSIS — I82409 Acute embolism and thrombosis of unspecified deep veins of unspecified lower extremity: Secondary | ICD-10-CM

## 2018-01-08 ENCOUNTER — Other Ambulatory Visit: Payer: BC Managed Care – PPO

## 2018-01-08 ENCOUNTER — Ambulatory Visit: Payer: BC Managed Care – PPO | Admitting: Family

## 2018-01-15 ENCOUNTER — Other Ambulatory Visit: Payer: BC Managed Care – PPO

## 2018-01-15 ENCOUNTER — Ambulatory Visit: Payer: BC Managed Care – PPO | Admitting: Family

## 2018-02-12 ENCOUNTER — Other Ambulatory Visit: Payer: Self-pay | Admitting: Hematology & Oncology

## 2018-02-12 DIAGNOSIS — I82409 Acute embolism and thrombosis of unspecified deep veins of unspecified lower extremity: Secondary | ICD-10-CM

## 2018-02-13 ENCOUNTER — Other Ambulatory Visit: Payer: Self-pay | Admitting: Family

## 2018-02-13 DIAGNOSIS — I824Z2 Acute embolism and thrombosis of unspecified deep veins of left distal lower extremity: Secondary | ICD-10-CM

## 2018-02-13 DIAGNOSIS — I2692 Saddle embolus of pulmonary artery without acute cor pulmonale: Secondary | ICD-10-CM

## 2018-02-13 DIAGNOSIS — I82413 Acute embolism and thrombosis of femoral vein, bilateral: Secondary | ICD-10-CM

## 2018-03-28 DIAGNOSIS — J982 Interstitial emphysema: Secondary | ICD-10-CM | POA: Insufficient documentation

## 2018-03-28 DIAGNOSIS — K223 Perforation of esophagus: Secondary | ICD-10-CM | POA: Insufficient documentation

## 2018-03-28 HISTORY — DX: Interstitial emphysema: J98.2

## 2018-04-02 ENCOUNTER — Other Ambulatory Visit: Payer: Self-pay | Admitting: Hematology & Oncology

## 2018-04-02 DIAGNOSIS — R350 Frequency of micturition: Secondary | ICD-10-CM | POA: Insufficient documentation

## 2018-04-02 DIAGNOSIS — I82409 Acute embolism and thrombosis of unspecified deep veins of unspecified lower extremity: Secondary | ICD-10-CM

## 2018-05-01 ENCOUNTER — Inpatient Hospital Stay: Payer: BC Managed Care – PPO | Attending: Family | Admitting: Family

## 2018-05-01 ENCOUNTER — Other Ambulatory Visit: Payer: Self-pay | Admitting: *Deleted

## 2018-05-01 ENCOUNTER — Telehealth: Payer: Self-pay | Admitting: Hematology & Oncology

## 2018-05-01 ENCOUNTER — Encounter: Payer: Self-pay | Admitting: *Deleted

## 2018-05-01 ENCOUNTER — Encounter: Payer: Self-pay | Admitting: Family

## 2018-05-01 ENCOUNTER — Inpatient Hospital Stay: Payer: BC Managed Care – PPO

## 2018-05-01 ENCOUNTER — Other Ambulatory Visit: Payer: Self-pay

## 2018-05-01 VITALS — BP 160/80 | HR 66 | Temp 98.3°F | Resp 17

## 2018-05-01 DIAGNOSIS — I779 Disorder of arteries and arterioles, unspecified: Secondary | ICD-10-CM

## 2018-05-01 DIAGNOSIS — I2692 Saddle embolus of pulmonary artery without acute cor pulmonale: Secondary | ICD-10-CM

## 2018-05-01 DIAGNOSIS — Z7901 Long term (current) use of anticoagulants: Secondary | ICD-10-CM

## 2018-05-01 DIAGNOSIS — D751 Secondary polycythemia: Secondary | ICD-10-CM | POA: Diagnosis not present

## 2018-05-01 DIAGNOSIS — D509 Iron deficiency anemia, unspecified: Secondary | ICD-10-CM

## 2018-05-01 DIAGNOSIS — I82513 Chronic embolism and thrombosis of femoral vein, bilateral: Secondary | ICD-10-CM

## 2018-05-01 DIAGNOSIS — Z79899 Other long term (current) drug therapy: Secondary | ICD-10-CM

## 2018-05-01 DIAGNOSIS — Z86718 Personal history of other venous thrombosis and embolism: Secondary | ICD-10-CM

## 2018-05-01 DIAGNOSIS — I824Z2 Acute embolism and thrombosis of unspecified deep veins of left distal lower extremity: Secondary | ICD-10-CM

## 2018-05-01 DIAGNOSIS — D500 Iron deficiency anemia secondary to blood loss (chronic): Secondary | ICD-10-CM

## 2018-05-01 LAB — CBC WITH DIFFERENTIAL (CANCER CENTER ONLY)
Abs Immature Granulocytes: 0.03 10*3/uL (ref 0.00–0.07)
Basophils Absolute: 0 10*3/uL (ref 0.0–0.1)
Basophils Relative: 0 %
Eosinophils Absolute: 0.4 10*3/uL (ref 0.0–0.5)
Eosinophils Relative: 7 %
HCT: 33.2 % — ABNORMAL LOW (ref 39.0–52.0)
Hemoglobin: 11.4 g/dL — ABNORMAL LOW (ref 13.0–17.0)
Immature Granulocytes: 1 %
Lymphocytes Relative: 25 %
Lymphs Abs: 1.3 10*3/uL (ref 0.7–4.0)
MCH: 38.6 pg — ABNORMAL HIGH (ref 26.0–34.0)
MCHC: 34.3 g/dL (ref 30.0–36.0)
MCV: 112.5 fL — ABNORMAL HIGH (ref 80.0–100.0)
Monocytes Absolute: 0.6 10*3/uL (ref 0.1–1.0)
Monocytes Relative: 12 %
Neutro Abs: 2.8 10*3/uL (ref 1.7–7.7)
Neutrophils Relative %: 55 %
Platelet Count: 194 10*3/uL (ref 150–400)
RBC: 2.95 MIL/uL — ABNORMAL LOW (ref 4.22–5.81)
RDW: 13.5 % (ref 11.5–15.5)
WBC Count: 5.2 10*3/uL (ref 4.0–10.5)
nRBC: 0 % (ref 0.0–0.2)

## 2018-05-01 LAB — CMP (CANCER CENTER ONLY)
ALT: 39 U/L (ref 0–44)
AST: 29 U/L (ref 15–41)
Albumin: 3.3 g/dL — ABNORMAL LOW (ref 3.5–5.0)
Alkaline Phosphatase: 57 U/L (ref 38–126)
Anion gap: 8 (ref 5–15)
BUN: 8 mg/dL (ref 6–20)
CO2: 28 mmol/L (ref 22–32)
Calcium: 7.9 mg/dL — ABNORMAL LOW (ref 8.9–10.3)
Chloride: 103 mmol/L (ref 98–111)
Creatinine: 0.88 mg/dL (ref 0.61–1.24)
GFR, Est AFR Am: >60 mL/min (ref >60)
GFR, Estimated: >60 mL/min (ref >60)
Glucose, Bld: 125 mg/dL — ABNORMAL HIGH (ref 70–99)
Potassium: 3.1 mmol/L — ABNORMAL LOW (ref 3.5–5.1)
Sodium: 139 mmol/L (ref 135–145)
Total Bilirubin: 0.6 mg/dL (ref 0.3–1.2)
Total Protein: 5 g/dL — ABNORMAL LOW (ref 6.5–8.1)

## 2018-05-01 NOTE — Telephone Encounter (Signed)
Called and spoke with patient regarding follow up appt added to his schedule per 4/14 los

## 2018-05-01 NOTE — Progress Notes (Signed)
Hematology and Oncology Follow Up Visit  [place] Falls[place] Scott Quraishi [id number] 10-12-1965 53 y.o. 05/01/2018   Principle Diagnosis:  1. 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 History of DVT right lower extremity and PE 3. 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 S/p Thrombolysis through bilateral lower extremity infusion catheters 2. Arixtra 7.5 mg SQ daily, lifelong - prefers this to oral anticoagulation, history of GI bleed on oral 3. Phlebotomy to maintain hematocrit below 45%   Interim History:  [name] is here today for follow-up. He is doing fairly well.  He states that he had an endoscopy and colonoscopy last week with [facility] due to frequent n/v and has an appointment tomorrow to go over the results. He states that they did stretch his esophagus and he is able to eat now with no n/v. He is still smoking.  He has history of PVD with right iliac stent. He has had some tenderness and puffiness in both his feet and ankles that waxes and wanes. He has not been wearing his compression stockings while working 12 hour shifts. He will start wearing his stockings again.   He has occasional dizziness and issues with balance. No falls or syncopal episodes to report.  No episodes of bleeding, no bruising or petechiae.   No fever, chills, cough, rash, SOB, chest pain, palpitations, abdominal pain or changes in bowel or bladder habits.  No numbness or tingling in he extremities at this time.  No lymphadenopathy noted on exam.  As mentioned above, he is eating well and staying well hydrated. His weight is stable.   ECOG Performance Status: 1 - Symptomatic but completely ambulatory  Medications:  Allergies as of 05/01/2018   No Known Allergies     Medication List       Accurate as of May 01, 2018 11:51 AM. Always use your most recent med list.        Belsomra 10 MG Tabs Generic  drug:  Suvorexant TAKE ONE TABLET BY MOUTH AT BEDTIME AS NEEDED   escitalopram 10 MG tablet Commonly known as:  LEXAPRO Take 1 tablet (10 mg total) by mouth daily.   ferrous sulfate 325 (65 FE) MG tablet Take 325 mg by mouth daily with breakfast.   folic acid 1 MG tablet Commonly known as:  FOLVITE Take 1 tablet (1 mg total) by mouth daily.   fondaparinux 7.5 MG/0.6ML Soln injection Commonly known as:  ARIXTRA INJECT 1 SYRINGE( 0.6 ML) UNDER THE SKIN ONCE DAILY   Integra Plus Caps Take 1 capsule by mouth daily.   lisinopril 5 MG tablet Commonly known as:  PRINIVIL,ZESTRIL Take 5 mg by mouth daily.   omeprazole 20 MG capsule Commonly known as:  PRILOSEC Take 20 mg by mouth daily.   ProAir HFA 108 (90 Base) MCG/ACT inhaler Generic drug:  albuterol inhale TWO PUFF EVERY 4 TO 6 HOURS AS NEEDED FOR coughing/ wheezing/ SHORTNESS OF BREATH   prochlorperazine 10 MG tablet Commonly known as:  COMPAZINE   tamsulosin 0.4 MG Caps capsule Commonly known as:  FLOMAX TAKE ONE CAPSULE BY MOUTH EVERY DAY   traMADol 50 MG tablet Commonly known as:  ULTRAM TAKE ONE TABLET BY MOUTH EVERY 6 HOURS AS NEEDED       Allergies: No Known Allergies  Past Medical History, Surgical history, Social history, and Family History were reviewed and updated.  Review of Systems: All other  10 point review of systems is negative.   Physical Exam:  vitals were not taken for this visit.   Wt Readings from Last 3 Encounters:  10/09/17 152 lb (68.9 kg)  08/15/17 150 lb (68 kg)  07/04/17 144 lb (65.3 kg)    Ocular: Sclerae unicteric, pupils equal, round and reactive to light Ear-nose-throat: Oropharynx clear, dentition fair Lymphatic: No cervical or supraclavicular adenopathy Lungs no rales or rhonchi, good excursion bilaterally Heart regular rate and rhythm, no murmur appreciated Abd soft, nontender, positive bowel sounds, no liver or spleen tip palpated on exam, no fluid wave  MSK no focal  spinal tenderness, no joint edema Neuro: non-focal, well-oriented, appropriate affect Breasts: Deferred   Lab Results  Component Value Date   WBC 5.2 05/01/2018   HGB 11.4 (L) 05/01/2018   HCT 33.2 (L) 05/01/2018   MCV 112.5 (H) 05/01/2018   PLT 194 05/01/2018   Lab Results  Component Value Date   FERRITIN 182 10/09/2017   IRON 231 (H) 10/09/2017   TIBC 289 10/09/2017   UIBC 58 10/09/2017   IRONPCTSAT 80 10/09/2017   Lab Results  Component Value Date   RBC 2.95 (L) 05/01/2018   No results found for: KPAFRELGTCHN, LAMBDASER, KAPLAMBRATIO No results found for: IGGSERUM, IGA, IGMSERUM No results found for: Marda Stalker, SPEI   Chemistry      Component Value Date/Time   NA 139 05/01/2018 1106   NA 145 01/20/2017 1113   NA 143 02/19/2016 0952   K 3.1 (L) 05/01/2018 1106   K 3.7 01/20/2017 1113   K 3.9 02/19/2016 0952   CL 103 05/01/2018 1106   CL 103 01/20/2017 1113   CO2 28 05/01/2018 1106   CO2 30 01/20/2017 1113   CO2 28 02/19/2016 0952   BUN 8 05/01/2018 1106   BUN 7 01/20/2017 1113   BUN 8.3 02/19/2016 0952   CREATININE 0.88 05/01/2018 1106   CREATININE 0.9 01/20/2017 1113   CREATININE 1.0 02/19/2016 0952      Component Value Date/Time   CALCIUM 7.9 (L) 05/01/2018 1106   CALCIUM 9.5 01/20/2017 1113   CALCIUM 9.0 02/19/2016 0952   ALKPHOS 57 05/01/2018 1106   ALKPHOS 59 01/20/2017 1113   ALKPHOS 122 02/19/2016 0952   AST 29 05/01/2018 1106   AST 51 (H) 02/19/2016 0952   ALT 39 05/01/2018 1106   ALT 28 01/20/2017 1113   ALT 47 02/19/2016 0952   BILITOT 0.6 05/01/2018 1106   BILITOT 0.69 02/19/2016 0952       Impression and Plan: Mr. Janey Greaser is a pleasant 53 yo caucasian gentleman with recurrent lower extremity DVT with IVC filter in place and lifelong anticoagulation with Arixtra.  He also has history of secondary polycythemia due to heavy smoking.  He is still smoking and having issues with PVD.  He plans to follow-up with vascular Dr. Darrick Penna once the pandemic ends and he can get an appointment.  He will continue his same regimen with Arixtra.  Hct is 33.2%. No phlebotomy needed at this time.  I did give him our fax number and requested that he have his recent colonoscopy and endoscopy results faxed to our office. He states that he will do this.  We will see him back in another 6 months for follow-up.  He will contact our office with any questions or concerns. We can certainly see him sooner if need be.   Emeline Gins, NP 4/14/202011:51 AM

## 2018-05-01 NOTE — Progress Notes (Signed)
Patient called and c/o bilateral ankle swelling and increasing leg pain. "My oncologist told me to follow up with [facility]" Patient did not keep 2019 appt with NP and vascular studies. Will arrange both and have scheduling call with appointment. Patient agreeable.

## 2018-05-02 LAB — FERRITIN: Ferritin: 245 ng/mL (ref 24–336)

## 2018-05-02 LAB — IRON AND TIBC
Iron: 43 ug/dL (ref 42–163)
Saturation Ratios: 23 % (ref 20–55)
TIBC: 185 ug/dL — ABNORMAL LOW (ref 202–409)
UIBC: 142 ug/dL (ref 117–376)

## 2018-05-04 ENCOUNTER — Telehealth (HOSPITAL_COMMUNITY): Payer: Self-pay | Admitting: Rehabilitation

## 2018-05-04 NOTE — Telephone Encounter (Signed)
The above patient or their representative was contacted and gave the following answers to these questions:         Do you have any of the following symptoms? No  Fever                    Cough                   Shortness of breath  Do  you have any of the following other symptoms? No   muscle pain         vomiting,        diarrhea        rash         weakness        red eye        abdominal pain         bruising          bruising or bleeding              joint pain           severe headache    Have you been in contact with someone who was or has been sick in the past 2 weeks? No  Yes                 Unsure                         Unable to assess   Does the person that you were in contact with have any of the following symptoms?   Cough         shortness of breath           muscle pain         vomiting,            diarrhea            rash            weakness           fever            red eye           abdominal pain           bruising  or  bleeding                joint pain                severe headache               Have you  or someone you have been in contact with traveled internationally in th last month? No        If yes, which countries?   Have you  or someone you have been in contact with traveled outside [place] in th last month? No         If yes, which state and city?   COMMENTS OR ACTION PLAN FOR THIS PATIENT:          

## 2018-05-06 ENCOUNTER — Other Ambulatory Visit: Payer: Self-pay | Admitting: Hematology & Oncology

## 2018-05-06 DIAGNOSIS — I82409 Acute embolism and thrombosis of unspecified deep veins of unspecified lower extremity: Secondary | ICD-10-CM

## 2018-05-07 ENCOUNTER — Ambulatory Visit (INDEPENDENT_AMBULATORY_CARE_PROVIDER_SITE_OTHER)
Admission: RE | Admit: 2018-05-07 | Discharge: 2018-05-07 | Disposition: A | Discharge status: Home / Self Care | Payer: BC Managed Care – PPO | Source: Ambulatory Visit | Attending: Family | Admitting: Family

## 2018-05-07 ENCOUNTER — Encounter: Payer: Self-pay | Admitting: Family

## 2018-05-07 ENCOUNTER — Other Ambulatory Visit: Payer: Self-pay

## 2018-05-07 ENCOUNTER — Other Ambulatory Visit (HOSPITAL_COMMUNITY): Payer: Self-pay | Admitting: Family

## 2018-05-07 ENCOUNTER — Ambulatory Visit (INDEPENDENT_AMBULATORY_CARE_PROVIDER_SITE_OTHER)
Admission: RE | Admit: 2018-05-07 | Discharge: 2018-05-07 | Disposition: A | Discharge status: Home / Self Care | Payer: BC Managed Care – PPO | Source: Ambulatory Visit | Attending: Surgery | Admitting: Surgery

## 2018-05-07 ENCOUNTER — Ambulatory Visit (INDEPENDENT_AMBULATORY_CARE_PROVIDER_SITE_OTHER): Payer: BC Managed Care – PPO | Admitting: Family

## 2018-05-07 ENCOUNTER — Ambulatory Visit (HOSPITAL_COMMUNITY)
Admission: RE | Admit: 2018-05-07 | Discharge: 2018-05-07 | Disposition: A | Discharge status: Home / Self Care | Payer: BC Managed Care – PPO | Source: Ambulatory Visit | Attending: Family | Admitting: Family

## 2018-05-07 VITALS — BP 180/103 | HR 66 | Temp 98.4°F | Resp 16 | Ht 68.0 in | Wt 143.5 lb

## 2018-05-07 DIAGNOSIS — I779 Disorder of arteries and arterioles, unspecified: Secondary | ICD-10-CM | POA: Insufficient documentation

## 2018-05-07 DIAGNOSIS — M7989 Other specified soft tissue disorders: Secondary | ICD-10-CM | POA: Insufficient documentation

## 2018-05-07 DIAGNOSIS — F17200 Nicotine dependence, unspecified, uncomplicated: Secondary | ICD-10-CM | POA: Diagnosis not present

## 2018-05-07 DIAGNOSIS — Z86718 Personal history of other venous thrombosis and embolism: Secondary | ICD-10-CM

## 2018-05-07 DIAGNOSIS — Z95828 Presence of other vascular implants and grafts: Secondary | ICD-10-CM | POA: Diagnosis not present

## 2018-05-07 DIAGNOSIS — I10 Essential (primary) hypertension: Secondary | ICD-10-CM

## 2018-05-07 NOTE — Patient Instructions (Signed)
Steps to Quit Smoking  Smoking tobacco can be bad for your health. It can also affect almost every organ in your body. Smoking puts you and people around you at risk for many serious long-lasting (chronic) diseases. Quitting smoking is hard, but it is one of the best things that you can do for your health. It is never too late to quit. What are the benefits of quitting smoking? When you quit smoking, you lower your risk for getting serious diseases and conditions. They can include:  Lung cancer or lung disease.  Heart disease.  Stroke.  Heart attack.  Not being able to have children (infertility).  Weak bones (osteoporosis) and broken bones (fractures). If you have coughing, wheezing, and shortness of breath, those symptoms may get better when you quit. You may also get sick less often. If you are pregnant, quitting smoking can help to lower your chances of having a baby of low birth weight. What can I do to help me quit smoking? Talk with your doctor about what can help you quit smoking. Some things you can do (strategies) include:  Quitting smoking totally, instead of slowly cutting back how much you smoke over a period of time.  Going to in-person counseling. You are more likely to quit if you go to many counseling sessions.  Using resources and support systems, such as: ? Online chats with a counselor. ? Phone quitlines. ? Printed self-help materials. ? Support groups or group counseling. ? Text messaging programs. ? Mobile phone apps or applications.  Taking medicines. Some of these medicines may have nicotine in them. If you are pregnant or breastfeeding, do not take any medicines to quit smoking unless your doctor says it is okay. Talk with your doctor about counseling or other things that can help you. Talk with your doctor about using more than one strategy at the same time, such as taking medicines while you are also going to in-person counseling. This can help make  quitting easier. What things can I do to make it easier to quit? Quitting smoking might feel very hard at first, but there is a lot that you can do to make it easier. Take these steps:  Talk to your family and friends. Ask them to support and encourage you.  Call phone quitlines, reach out to support groups, or work with a counselor.  Ask people who smoke to not smoke around you.  Avoid places that make you want (trigger) to smoke, such as: ? Bars. ? Parties. ? Smoke-break areas at work.  Spend time with people who do not smoke.  Lower the stress in your life. Stress can make you want to smoke. Try these things to help your stress: ? Getting regular exercise. ? Deep-breathing exercises. ? Yoga. ? Meditating. ? Doing a body scan. To do this, close your eyes, focus on one area of your body at a time from head to toe, and notice which parts of your body are tense. Try to relax the muscles in those areas.  Download or buy apps on your mobile phone or tablet that can help you stick to your quit plan. There are many free apps, such as QuitGuide from the CDC (Centers for Disease Control and Prevention). You can find more support from smokefree.gov and other websites. This information is not intended to replace advice given to you by your health care provider. Make sure you discuss any questions you have with your health care provider. Document Released: 10/30/2008 Document Revised: 09/01/2015   Document Reviewed: 05/20/2014 Elsevier Interactive Patient Education  2019 Elsevier Inc.     Peripheral Vascular Disease  Peripheral vascular disease (PVD) is a disease of the blood vessels that are not part of your heart and brain. A simple term for PVD is poor circulation. In most cases, PVD narrows the blood vessels that carry blood from your heart to the rest of your body. This can reduce the supply of blood to your arms, legs, and internal organs, like your stomach or kidneys. However, PVD most  often affects a person's lower legs and feet. Without treatment, PVD tends to get worse. PVD can also lead to acute ischemic limb. This is when an arm or leg suddenly cannot get enough blood. This is a medical emergency. Follow these instructions at home: Lifestyle  Do not use any products that contain nicotine or tobacco, such as cigarettes and e-cigarettes. If you need help quitting, ask your doctor.  Lose weight if you are overweight. Or, stay at a healthy weight as told by your doctor.  Eat a diet that is low in fat and cholesterol. If you need help, ask your doctor.  Exercise regularly. Ask your doctor for activities that are right for you. General instructions  Take over-the-counter and prescription medicines only as told by your doctor.  Take good care of your feet: ? Wear comfortable shoes that fit well. ? Check your feet often for any cuts or sores.  Keep all follow-up visits as told by your doctor This is important. Contact a doctor if:  You have cramps in your legs when you walk.  You have leg pain when you are at rest.  You have coldness in a leg or foot.  Your skin changes.  You are unable to get or have an erection (erectile dysfunction).  You have cuts or sores on your feet that do not heal. Get help right away if:  Your arm or leg turns cold, numb, and blue.  Your arms or legs become red, warm, swollen, painful, or numb.  You have chest pain.  You have trouble breathing.  You suddenly have weakness in your face, arm, or leg.  You become very confused or you cannot speak.  You suddenly have a very bad headache.  You suddenly cannot see. Summary  Peripheral vascular disease (PVD) is a disease of the blood vessels.  A simple term for PVD is poor circulation. Without treatment, PVD tends to get worse.  Treatment may include exercise, low fat and low cholesterol diet, and quitting smoking. This information is not intended to replace advice given to  you by your health care provider. Make sure you discuss any questions you have with your health care provider. Document Released: 03/30/2009 Document Revised: 02/11/2016 Document Reviewed: 02/11/2016 Elsevier Interactive Patient Education  2019 Elsevier Inc.  

## 2018-05-07 NOTE — Progress Notes (Signed)
[facility] [place]   CC: pain in right calf, hx of peripheral artery occlusive disease  History of Present Illness [place] is a 53 y.o. male former patient of Dr. Madilyn FiremanHayes who returns today for evaluation of his PAD. He was lost to follow up for a while. He is s/p right CIA stent with PTA in 2007 and right LE thrombolysis in 2010 by Dr. Hart RochesterLawson. February 2014 ABI's were in the normal range with bi and triphasic waveforms, digit pressures were normal range. He walks as part of his job most of his day, 7 days/week, very little sitting. He denies non healing wounds.   Pt denies any history of stroke or TIA.  He was treated for kidney stones in [place], legs started swelling, he was found here to have bilateral lower legs DVT, states he did not have kidney stones In June or July of 2016 he had a PE, he had a venous filter placed. Pt states he is under the care of a hematologist to see why he is having venous clotting issues, abnormalities in WBC's and RBC's.  He had a cholecystectomy in the Fall of 2017 for cholecystitis.   He states his right calf started aching about a month ago, swelling started 4 days ago, resolved 1-2 days ago. His left ankle was swollen slightly on awaking 3 days ago, resolved the next day with elevation of his legs. He states that the right calf swelling was worse than the left. The swelling has resolved in both lower legs, but the pain is worse in his right calf and remains. He is wearing his knee high compression hose.  He denies dyspnea, continues to to take the Arixtra.   He states that he has 2 more days left of his lisinopril. His blood pressure is elevated now, he denies chest pain, he states his allergies cause mild occasional dyspnea, he gave himself a breathing treatment yesterday.   He states he has known lumbar spine problems that are manifested by occasional right lower back pain and numbness in his lateral right thigh,  relieved by taking off his heavy work gear and getting off the concrete floor that he works on.    Diabetic: No Tobacco use: smoker  (1/2 ppd, started about age 53 yrs)  Pt meds include: Statin :No Betablocker: Yes ASA: No Other anticoagulants/antiplatelets: Arixtra started for DVT in December 2016    Past Medical History:  Diagnosis Date  . Anxiety   . Bronchiolitis Feb. 2016  . DVT (deep venous thrombosis) (HCC)   . GERD (gastroesophageal reflux disease)   . Hyperlipidemia   . Peripheral vascular disease (HCC)   . Pneumonia Feb. 2016  . PVD (peripheral vascular disease) (HCC) 01/09/2015   Right iliac stent 2007    Social History Social History   Tobacco Use  . Smoking status: Light Tobacco Smoker    Packs/day: 0.50    Years: 30.00    Pack years: 15.00    Types: Cigarettes  . Smokeless tobacco: Never Used  . Tobacco comment: Vapor  Substance Use Topics  . Alcohol use: Yes    Alcohol/week: 1.0 standard drinks    Types: 1 Glasses of wine per week  . Drug use: No    Family History Family History  Problem Relation Age of Onset  . Deep vein thrombosis Father   . Heart disease Father [id number]       Before age 53  . Hyperlipidemia Father   . Heart  attack Father   . Stroke Father     Past Surgical History:  Procedure Laterality Date  . CHOLECYSTECTOMY  10/2015  . HAND SURGERY    . IVC FILTER PLACEMENT ([facility] HX)  2016   . ROTATOR CUFF REPAIR    . THROMBOLYSIS Right Nov. 9, 2010   Lower Extrim.  . TONSILLECTOMY      No Known Allergies  Current Outpatient Medications  Medication Sig Dispense Refill  . BELSOMRA 10 MG TABS TAKE ONE TABLET BY MOUTH AT BEDTIME AS NEEDED 30 tablet 1  . buPROPion (WELLBUTRIN SR) 100 MG 12 hr tablet Take by mouth.    . escitalopram (LEXAPRO) 10 MG tablet Take 1 tablet (10 mg total) by mouth daily. 90 tablet 2  . FeFum-FePoly-FA-B Cmp-C-Biot (INTEGRA PLUS) CAPS Take 1 capsule by mouth daily. 90 capsule 2  . ferrous sulfate 325 (65  FE) MG tablet Take 325 mg by mouth daily with breakfast.    . folic acid (FOLVITE) 1 MG tablet Take 1 tablet (1 mg total) by mouth daily. 90 tablet 6  . fondaparinux (ARIXTRA) 7.5 MG/0.6ML SOLN injection INJECT 1 SYRINGE( 0.6 ML) UNDER THE SKIN ONCE DAILY 18 mL 0  . lisinopril (PRINIVIL,ZESTRIL) 5 MG tablet Take 5 mg by mouth daily.    Marland Kitchen MAGOX 400 400 (241.3 Mg) MG tablet TK PO QD    . metoprolol tartrate (LOPRESSOR) 25 MG tablet TK 1 T PO BID    . olmesartan (BENICAR) 40 MG tablet Take 1 tablet (40 mg total) by mouth daily.    Marland Kitchen omeprazole (PRILOSEC) 40 MG capsule     . pantoprazole (PROTONIX) 40 MG tablet TAKE 1 TABLET BY MOUTH DAILY FOR 30 DAYS    . PROAIR HFA 108 (90 Base) MCG/ACT inhaler inhale TWO PUFF EVERY 4 TO 6 HOURS AS NEEDED FOR coughing/ wheezing/ SHORTNESS OF BREATH  0  . sucralfate (CARAFATE) 1 GM/10ML suspension TK 10 ML PO QID BEFORE MEALS AND HS    . tamsulosin (FLOMAX) 0.4 MG CAPS capsule TAKE ONE CAPSULE BY MOUTH EVERY DAY 30 capsule 2   No current facility-administered medications for this visit.     ROS: See HPI for pertinent positives and negatives.   Physical Examination  Vitals:   05/07/18 0837 05/07/18 0844  BP: (!) 180/101 (!) 180/103  Pulse: 66   Resp: 16   Temp: 98.4 F (36.9 C)   TempSrc: Oral   SpO2: 99%   Weight: 143 lb 8 oz (65.1 kg)   Height:  (1.727 m)    Body mass index is 21.82 kg/m.  General: A&O x 3, WDWN, male. Gait: normal HENT: No gross abnormalities.  Eyes: PERRLA. Pulmonary: Respirations are non labored, wheezes and limited air movement in right posterior lung fields, faint rales 2/3 up in right posterior fields, no rhonchi.  Cardiac: regular rhythm, no detected murmur.         Carotid Bruits Right Left   Negative Negative   Radial pulses are 2+ palpable bilaterally   Adominal aortic pulse is not palpable                         VASCULAR EXAM: Extremities without ischemic changes, without Gangrene; without open  wounds.  LE Pulses Right Left       FEMORAL  2+ palpable  2+ palpable        POPLITEAL  not palpable   not palpable       POSTERIOR TIBIAL  1+ palpable   1+ palpable        DORSALIS PEDIS      ANTERIOR TIBIAL 1+ palpable  1+ palpable    Abdomen: soft, NT, no palpable masses. Skin: no rashes, no cellulitis, no ulcers noted. Musculoskeletal: no muscle wasting or atrophy.  Neurologic: A&O X 3; appropriate affect, Sensation is normal; MOTOR FUNCTION:  moving all extremities equally, motor strength 5/5 throughout. Speech is fluent/normal. CN 2-12 intact. Straight leg raise against resistance elicits no pain in bilateral legs.  Psychiatric: Thought content is normal, mood appropriate for clinical situation.    ASSESSMENT: Coleton Woon is a 53 y.o. male who is s/p right CIA stent with PTA in 2007 and right LE thrombolysis in 2010 by Dr. Hart Rochester.  ABI's and right iliac artery duplex today remain normal, no stenosis, all triphasic waveforms.   He had a PE in July 2016 and DVT in both calves in December 2016, is taking Arixtra. He has an IVC filter, placed in 2016. 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  He has had intermittent swelling in right calf and left ankle for the last 3-4 days. Swelling has resolved, pain remains in right calf at rest.  He has wheezes and faint rales with limited air movement in his right posterior lung fields. He states his mild dyspnea is due to allergies. He took a breathing treatment yesterday.  He states he has intermittent right low back pain and right lateral thigh numbness at work which he attributes to wearing heavy gear and walking on concrete floors.  Straight leg raise test against resistance is negative bilaterally. I advised pt to discuss his intermittent back pain with his PCP.   Bilateral LE DVT  duplex done today to evaluate for DVT: no DVT.  He continues to smoke but is trying to quite, taking Wellbutrin.   His blood pressure is elevated now, states he did not take his medications this morning yet. He denies chest pain. I advised him to take his medications ASAP and to seek advice from his PCP re his elevated blood pressure.  We also discussed the wheezing and faint rales on auscultation of his right posterior lung fields.    DATA  Bilateral LE Venous Duplex (05-07-18); No DVT   Right Iliac Artery Duplex (05-07-18): Right Stent(s): +---------------+---+-----------+---------++ Prox to Stent  64 no stenosistriphasic +---------------+---+-----------+---------++ Proximal Stent 136no stenosistriphasic +---------------+---+-----------+---------++ Mid Stent      142no stenosistriphasic +---------------+---+-----------+---------++ Distal Stent   135no stenosistriphasic +---------------+---+-----------+---------++ Distal to Stent117no stenosistriphasic +---------------+---+-----------+---------++   Summary: Stenosis: +------------------+-----------+ Location          Stent       +------------------+-----------+ Right Common Iliacno stenosis +------------------+-----------+    ABI (Date: 05/07/2018): ABI Findings: +---------+------------------+-----+---------+--------+ Right    Rt Pressure (mmHg)IndexWaveform Comment  +---------+------------------+-----+---------+--------+ Brachial 191                                      +---------+------------------+-----+---------+--------+ PTA      209               1.09 triphasic         +---------+------------------+-----+---------+--------+  DP       209               1.09 triphasic         +---------+------------------+-----+---------+--------+ Great Toe137               0.71                    +---------+------------------+-----+---------+--------+  +---------+------------------+-----+---------+-------+ Left     Lt Pressure (mmHg)IndexWaveform Comment +---------+------------------+-----+---------+-------+ Brachial 192                                     +---------+------------------+-----+---------+-------+ PTA      212               1.10 triphasic        +---------+------------------+-----+---------+-------+ DP       200               1.04 triphasic        +---------+------------------+-----+---------+-------+ Great Toe141               0.73                  +---------+------------------+-----+---------+-------+  +-------+-----------+-----------+------------+------------+ ABI/TBIToday's ABIToday's TBIPrevious ABIPrevious TBI +-------+-----------+-----------+------------+------------+ Right  1.09       0.71       1.08                     +-------+-----------+-----------+------------+------------+ Left   1.10       0.73       1.12                     +-------+-----------+-----------+------------+------------+ Bilateral ABIs appear essentially unchanged.   Summary: Right: Resting right ankle-brachial index is within normal range. No evidence of significant right lower extremity arterial disease. The right toe-brachial index is normal. Left: Resting left ankle-brachial index is within normal range. No evidence of significant left lower extremity arterial disease. The left toe-brachial index is normal.    PLAN:  Based on the patient's vascular studies and examination, pt will return to clinic in 1 year with right aortoiliac duplex and ABI's.  Continue extensive walking.  I advised him to notify us if he develops concerns re the circulation in his feet or legs.  I advised pt to speak with his PCP re the right calf and intermittent right low back pain and right lateral thigh numbness, that is not venous nor arterial in  etiology.   Over 3 minutes was spent counseling patient re smoking cessation, and patient was given several free resources re smoking cessation.  I discussed in depth with the patient the nature of atherosclerosis, and emphasized the importance of maximal medical management including strict control of blood pressure, blood glucose, and lipid levels, obtaining regular exercise, and cessation of smoking.  The patient is aware that without maximal medical management the underlying atherosclerotic disease process will progress, limiting the benefit of any interventions.  The patient was given information about PAD including signs, symptoms, treatment, what symptoms should prompt the patient to seek immediate medical care, and risk reduction measures to take.  Charisse March, RN, MSN, FNP-C [facility] [place] Phone: [phone]  Clinic MD: Myra Gianotti  05/07/18 8:50 AM

## 2018-05-17 DIAGNOSIS — M5416 Radiculopathy, lumbar region: Secondary | ICD-10-CM | POA: Insufficient documentation

## 2018-05-17 HISTORY — DX: Radiculopathy, lumbar region: M54.16

## 2018-05-27 DIAGNOSIS — J9621 Acute and chronic respiratory failure with hypoxia: Secondary | ICD-10-CM

## 2018-05-27 DIAGNOSIS — I82409 Acute embolism and thrombosis of unspecified deep veins of unspecified lower extremity: Secondary | ICD-10-CM

## 2018-05-27 DIAGNOSIS — E872 Acidosis: Secondary | ICD-10-CM

## 2018-05-27 DIAGNOSIS — I2699 Other pulmonary embolism without acute cor pulmonale: Secondary | ICD-10-CM

## 2018-05-27 DIAGNOSIS — K219 Gastro-esophageal reflux disease without esophagitis: Secondary | ICD-10-CM

## 2018-05-27 DIAGNOSIS — J441 Chronic obstructive pulmonary disease with (acute) exacerbation: Secondary | ICD-10-CM | POA: Diagnosis not present

## 2018-05-27 DIAGNOSIS — I110 Hypertensive heart disease with heart failure: Secondary | ICD-10-CM | POA: Diagnosis not present

## 2018-05-28 DIAGNOSIS — J9621 Acute and chronic respiratory failure with hypoxia: Secondary | ICD-10-CM | POA: Diagnosis not present

## 2018-05-28 DIAGNOSIS — E872 Acidosis: Secondary | ICD-10-CM | POA: Diagnosis not present

## 2018-05-28 DIAGNOSIS — J441 Chronic obstructive pulmonary disease with (acute) exacerbation: Secondary | ICD-10-CM | POA: Diagnosis not present

## 2018-05-29 DIAGNOSIS — J9621 Acute and chronic respiratory failure with hypoxia: Secondary | ICD-10-CM | POA: Diagnosis not present

## 2018-05-29 DIAGNOSIS — E872 Acidosis: Secondary | ICD-10-CM | POA: Diagnosis not present

## 2018-05-29 DIAGNOSIS — J441 Chronic obstructive pulmonary disease with (acute) exacerbation: Secondary | ICD-10-CM | POA: Diagnosis not present

## 2018-06-17 ENCOUNTER — Other Ambulatory Visit: Payer: Self-pay | Admitting: Family

## 2018-06-17 DIAGNOSIS — I824Z2 Acute embolism and thrombosis of unspecified deep veins of left distal lower extremity: Secondary | ICD-10-CM

## 2018-06-17 DIAGNOSIS — I2692 Saddle embolus of pulmonary artery without acute cor pulmonale: Secondary | ICD-10-CM

## 2018-06-17 DIAGNOSIS — I82413 Acute embolism and thrombosis of femoral vein, bilateral: Secondary | ICD-10-CM

## 2018-06-22 ENCOUNTER — Other Ambulatory Visit: Payer: Self-pay | Admitting: Hematology & Oncology

## 2018-06-22 DIAGNOSIS — I82409 Acute embolism and thrombosis of unspecified deep veins of unspecified lower extremity: Secondary | ICD-10-CM

## 2018-07-18 DIAGNOSIS — J439 Emphysema, unspecified: Secondary | ICD-10-CM | POA: Insufficient documentation

## 2018-07-18 HISTORY — DX: Emphysema, unspecified: J43.9

## 2018-07-19 ENCOUNTER — Other Ambulatory Visit: Payer: Self-pay | Admitting: Hematology & Oncology

## 2018-07-19 DIAGNOSIS — F419 Anxiety disorder, unspecified: Secondary | ICD-10-CM

## 2018-07-23 ENCOUNTER — Other Ambulatory Visit: Payer: Self-pay | Admitting: Hematology & Oncology

## 2018-07-23 DIAGNOSIS — I82409 Acute embolism and thrombosis of unspecified deep veins of unspecified lower extremity: Secondary | ICD-10-CM

## 2018-08-24 ENCOUNTER — Other Ambulatory Visit: Payer: Self-pay | Admitting: Hematology & Oncology

## 2018-08-24 DIAGNOSIS — I82409 Acute embolism and thrombosis of unspecified deep veins of unspecified lower extremity: Secondary | ICD-10-CM

## 2018-09-25 ENCOUNTER — Other Ambulatory Visit: Payer: Self-pay | Admitting: Family

## 2018-09-25 ENCOUNTER — Other Ambulatory Visit: Payer: Self-pay | Admitting: Hematology & Oncology

## 2018-09-25 DIAGNOSIS — I824Z2 Acute embolism and thrombosis of unspecified deep veins of left distal lower extremity: Secondary | ICD-10-CM

## 2018-09-25 DIAGNOSIS — I82413 Acute embolism and thrombosis of femoral vein, bilateral: Secondary | ICD-10-CM

## 2018-09-25 DIAGNOSIS — I2692 Saddle embolus of pulmonary artery without acute cor pulmonale: Secondary | ICD-10-CM

## 2018-09-25 DIAGNOSIS — I82409 Acute embolism and thrombosis of unspecified deep veins of unspecified lower extremity: Secondary | ICD-10-CM

## 2018-10-01 ENCOUNTER — Telehealth: Payer: Self-pay | Admitting: *Deleted

## 2018-10-01 ENCOUNTER — Other Ambulatory Visit: Payer: Self-pay | Admitting: *Deleted

## 2018-10-01 DIAGNOSIS — I82409 Acute embolism and thrombosis of unspecified deep veins of unspecified lower extremity: Secondary | ICD-10-CM

## 2018-10-01 MED ORDER — FONDAPARINUX SODIUM 7.5 MG/0.6ML ~~LOC~~ SOLN: 7.5000 mg | SUBCUTANEOUS | 0 refills | Status: DC

## 2018-10-01 NOTE — Telephone Encounter (Signed)
Pt called regarding recent Rx - [facility] advised they only have 8days worth of Arixtra for pt, the medication is on backorder and will not be available until November. [facility] will attempt to locate additional drug, however they have limited choices. Called pt and discussed above, however [facility] can order and have medication ready for pick up by Thursday. Pt advised he would not be able to pick up until Friday and he will call around to other pharmacies in his area for other options. Pt will call back if he locates another pharmacy. Rx sent to [facility] until further notice.

## 2018-10-01 NOTE — Telephone Encounter (Signed)
Pt called [facility]movm states [facility] and [facility].  [facility] placed [facility] (27 minutes ), who advised they only have 6 vials ( days ) worth for a Rx. They are also on backorder. Called pt advised to pick up his rx at [facility] as they will be able to fill his rx. Pt advised he will pick up Friday.

## 2018-10-03 ENCOUNTER — Other Ambulatory Visit: Payer: Self-pay | Admitting: *Deleted

## 2018-10-03 DIAGNOSIS — I82409 Acute embolism and thrombosis of unspecified deep veins of unspecified lower extremity: Secondary | ICD-10-CM

## 2018-10-03 MED ORDER — FONDAPARINUX SODIUM 7.5 MG/0.6ML ~~LOC~~ SOLN: 7.5000 mg | SUBCUTANEOUS | 11 refills | Status: DC

## 2018-10-08 MED FILL — FONDAPARINUX 7.5 MG/0.6 ML: 7.5 | 14 days supply | Qty: 8 | Fill #0

## 2018-10-24 ENCOUNTER — Other Ambulatory Visit: Payer: Self-pay | Admitting: Family

## 2018-10-24 DIAGNOSIS — I824Z2 Acute embolism and thrombosis of unspecified deep veins of left distal lower extremity: Secondary | ICD-10-CM

## 2018-10-31 ENCOUNTER — Ambulatory Visit: Payer: BC Managed Care – PPO | Admitting: Family

## 2018-10-31 ENCOUNTER — Other Ambulatory Visit: Payer: BC Managed Care – PPO

## 2018-12-28 ENCOUNTER — Other Ambulatory Visit: Payer: Self-pay | Admitting: Family

## 2018-12-28 DIAGNOSIS — I824Z2 Acute embolism and thrombosis of unspecified deep veins of left distal lower extremity: Secondary | ICD-10-CM

## 2018-12-28 DIAGNOSIS — I2692 Saddle embolus of pulmonary artery without acute cor pulmonale: Secondary | ICD-10-CM

## 2018-12-28 DIAGNOSIS — I82413 Acute embolism and thrombosis of femoral vein, bilateral: Secondary | ICD-10-CM

## 2019-03-25 DIAGNOSIS — U071 COVID-19: Secondary | ICD-10-CM | POA: Insufficient documentation

## 2019-05-24 ENCOUNTER — Other Ambulatory Visit: Payer: Self-pay | Admitting: Family

## 2019-05-24 DIAGNOSIS — D751 Secondary polycythemia: Secondary | ICD-10-CM

## 2019-05-24 DIAGNOSIS — I82513 Chronic embolism and thrombosis of femoral vein, bilateral: Secondary | ICD-10-CM

## 2019-05-24 DIAGNOSIS — I824Z2 Acute embolism and thrombosis of unspecified deep veins of left distal lower extremity: Secondary | ICD-10-CM

## 2019-05-27 ENCOUNTER — Ambulatory Visit: Payer: BC Managed Care – PPO | Admitting: Family

## 2019-05-27 ENCOUNTER — Other Ambulatory Visit: Payer: BC Managed Care – PPO

## 2019-06-06 DIAGNOSIS — I160 Hypertensive urgency: Secondary | ICD-10-CM | POA: Diagnosis not present

## 2019-06-06 DIAGNOSIS — E872 Acidosis: Secondary | ICD-10-CM

## 2019-06-06 DIAGNOSIS — F1010 Alcohol abuse, uncomplicated: Secondary | ICD-10-CM

## 2019-06-06 DIAGNOSIS — E876 Hypokalemia: Secondary | ICD-10-CM

## 2019-06-06 DIAGNOSIS — R290 Tetany: Secondary | ICD-10-CM | POA: Diagnosis not present

## 2019-06-06 DIAGNOSIS — R531 Weakness: Secondary | ICD-10-CM | POA: Diagnosis not present

## 2019-06-07 DIAGNOSIS — R531 Weakness: Secondary | ICD-10-CM | POA: Diagnosis not present

## 2019-06-07 DIAGNOSIS — F1010 Alcohol abuse, uncomplicated: Secondary | ICD-10-CM | POA: Diagnosis not present

## 2019-06-07 DIAGNOSIS — R290 Tetany: Secondary | ICD-10-CM | POA: Diagnosis not present

## 2019-06-07 DIAGNOSIS — I160 Hypertensive urgency: Secondary | ICD-10-CM | POA: Diagnosis not present

## 2019-06-08 DIAGNOSIS — R531 Weakness: Secondary | ICD-10-CM | POA: Diagnosis not present

## 2019-06-08 DIAGNOSIS — F1010 Alcohol abuse, uncomplicated: Secondary | ICD-10-CM | POA: Diagnosis not present

## 2019-06-08 DIAGNOSIS — I160 Hypertensive urgency: Secondary | ICD-10-CM | POA: Diagnosis not present

## 2019-06-08 DIAGNOSIS — R290 Tetany: Secondary | ICD-10-CM | POA: Diagnosis not present

## 2019-06-09 DIAGNOSIS — I160 Hypertensive urgency: Secondary | ICD-10-CM | POA: Diagnosis not present

## 2019-06-09 DIAGNOSIS — R531 Weakness: Secondary | ICD-10-CM | POA: Diagnosis not present

## 2019-06-09 DIAGNOSIS — F1010 Alcohol abuse, uncomplicated: Secondary | ICD-10-CM | POA: Diagnosis not present

## 2019-06-09 DIAGNOSIS — R290 Tetany: Secondary | ICD-10-CM | POA: Diagnosis not present

## 2019-06-24 ENCOUNTER — Other Ambulatory Visit: Payer: Self-pay | Admitting: Hematology & Oncology

## 2019-06-24 DIAGNOSIS — I82409 Acute embolism and thrombosis of unspecified deep veins of unspecified lower extremity: Secondary | ICD-10-CM

## 2019-06-26 ENCOUNTER — Other Ambulatory Visit: Payer: Self-pay | Admitting: Family

## 2019-06-26 ENCOUNTER — Other Ambulatory Visit: Payer: Self-pay | Admitting: Hematology & Oncology

## 2019-06-26 DIAGNOSIS — I824Z2 Acute embolism and thrombosis of unspecified deep veins of left distal lower extremity: Secondary | ICD-10-CM

## 2019-06-26 DIAGNOSIS — F419 Anxiety disorder, unspecified: Secondary | ICD-10-CM

## 2019-06-26 DIAGNOSIS — I2692 Saddle embolus of pulmonary artery without acute cor pulmonale: Secondary | ICD-10-CM

## 2019-06-26 DIAGNOSIS — I82413 Acute embolism and thrombosis of femoral vein, bilateral: Secondary | ICD-10-CM

## 2019-10-16 ENCOUNTER — Other Ambulatory Visit: Payer: Self-pay | Admitting: Family

## 2019-10-16 DIAGNOSIS — I2692 Saddle embolus of pulmonary artery without acute cor pulmonale: Secondary | ICD-10-CM

## 2019-10-16 DIAGNOSIS — I824Z2 Acute embolism and thrombosis of unspecified deep veins of left distal lower extremity: Secondary | ICD-10-CM

## 2019-10-16 DIAGNOSIS — I82413 Acute embolism and thrombosis of femoral vein, bilateral: Secondary | ICD-10-CM

## 2019-11-13 ENCOUNTER — Telehealth: Payer: Self-pay | Admitting: Family

## 2019-11-13 NOTE — Telephone Encounter (Signed)
Returned 10/26 VM from patient he was requesting to reschedule his missed appointments from May 2021.  He was ok with both date&time

## 2019-11-14 DIAGNOSIS — E876 Hypokalemia: Secondary | ICD-10-CM | POA: Insufficient documentation

## 2019-11-18 ENCOUNTER — Other Ambulatory Visit: Payer: BC Managed Care – PPO

## 2019-11-18 ENCOUNTER — Other Ambulatory Visit: Payer: Self-pay | Admitting: Family

## 2019-11-18 ENCOUNTER — Ambulatory Visit: Payer: BC Managed Care – PPO | Admitting: Family

## 2019-11-18 DIAGNOSIS — I824Z2 Acute embolism and thrombosis of unspecified deep veins of left distal lower extremity: Secondary | ICD-10-CM

## 2019-11-19 DIAGNOSIS — Z87898 Personal history of other specified conditions: Secondary | ICD-10-CM | POA: Insufficient documentation

## 2020-01-24 ENCOUNTER — Telehealth: Payer: Self-pay | Admitting: *Deleted

## 2020-01-24 NOTE — Telephone Encounter (Signed)
Call received from patient stating that he has been in the ER for 48 hours at [facility] and is waiting transfer to [facility].  He states that there are no beds available at [facility] and that [facility] is discharging him to home and was told to f/u with Dr. Myna Hidalgo.  Dr. Myna Hidalgo spoke with the discharging physician (Dr Lucianne Muss) from [facility] and pt will be discharged to home on 100 mg/kg of Lovenox daily.  Patient to have labs and to see Dr. Myna Hidalgo on Tuesday, 01/28/19 per order of Dr. Myna Hidalgo.  Message sent to scheduling.

## 2020-01-28 ENCOUNTER — Other Ambulatory Visit: Payer: Self-pay

## 2020-01-28 ENCOUNTER — Inpatient Hospital Stay (HOSPITAL_BASED_OUTPATIENT_CLINIC_OR_DEPARTMENT_OTHER): Payer: BC Managed Care – PPO | Admitting: Hematology & Oncology

## 2020-01-28 ENCOUNTER — Inpatient Hospital Stay: Payer: BC Managed Care – PPO | Attending: Hematology & Oncology

## 2020-01-28 VITALS — BP 139/84 | HR 74 | Temp 97.9°F | Resp 18 | Wt 165.0 lb

## 2020-01-28 DIAGNOSIS — D751 Secondary polycythemia: Secondary | ICD-10-CM

## 2020-01-28 DIAGNOSIS — F1020 Alcohol dependence, uncomplicated: Secondary | ICD-10-CM | POA: Insufficient documentation

## 2020-01-28 DIAGNOSIS — Z7901 Long term (current) use of anticoagulants: Secondary | ICD-10-CM | POA: Insufficient documentation

## 2020-01-28 DIAGNOSIS — I82403 Acute embolism and thrombosis of unspecified deep veins of lower extremity, bilateral: Secondary | ICD-10-CM | POA: Insufficient documentation

## 2020-01-28 DIAGNOSIS — F17210 Nicotine dependence, cigarettes, uncomplicated: Secondary | ICD-10-CM | POA: Diagnosis not present

## 2020-01-28 DIAGNOSIS — I82412 Acute embolism and thrombosis of left femoral vein: Secondary | ICD-10-CM | POA: Diagnosis not present

## 2020-01-28 DIAGNOSIS — Z86718 Personal history of other venous thrombosis and embolism: Secondary | ICD-10-CM | POA: Diagnosis not present

## 2020-01-28 DIAGNOSIS — I82513 Chronic embolism and thrombosis of femoral vein, bilateral: Secondary | ICD-10-CM

## 2020-01-28 LAB — CMP (CANCER CENTER ONLY)
ALT: 16 U/L (ref 0–44)
AST: 21 U/L (ref 15–41)
Albumin: 3 g/dL — ABNORMAL LOW (ref 3.5–5.0)
Alkaline Phosphatase: 50 U/L (ref 38–126)
Anion gap: 10 (ref 5–15)
BUN: 7 mg/dL (ref 6–20)
CO2: 27 mmol/L (ref 22–32)
Calcium: 8.6 mg/dL — ABNORMAL LOW (ref 8.9–10.3)
Chloride: 104 mmol/L (ref 98–111)
Creatinine: 1.05 mg/dL (ref 0.61–1.24)
GFR, Estimated: >60 mL/min (ref >60)
Glucose, Bld: 118 mg/dL — ABNORMAL HIGH (ref 70–99)
Potassium: 3.9 mmol/L (ref 3.5–5.1)
Sodium: 141 mmol/L (ref 135–145)
Total Bilirubin: 0.6 mg/dL (ref 0.3–1.2)
Total Protein: 5.6 g/dL — ABNORMAL LOW (ref 6.5–8.1)

## 2020-01-28 LAB — CBC WITH DIFFERENTIAL (CANCER CENTER ONLY)
Abs Immature Granulocytes: 0.04 10*3/uL (ref 0.00–0.07)
Basophils Absolute: 0 10*3/uL (ref 0.0–0.1)
Basophils Relative: 1 %
Eosinophils Absolute: 0.1 10*3/uL (ref 0.0–0.5)
Eosinophils Relative: 3 %
HCT: 37.9 % — ABNORMAL LOW (ref 39.0–52.0)
Hemoglobin: 12.9 g/dL — ABNORMAL LOW (ref 13.0–17.0)
Immature Granulocytes: 1 %
Lymphocytes Relative: 23 %
Lymphs Abs: 1.1 10*3/uL (ref 0.7–4.0)
MCH: 38.2 pg — ABNORMAL HIGH (ref 26.0–34.0)
MCHC: 34 g/dL (ref 30.0–36.0)
MCV: 112.1 fL — ABNORMAL HIGH (ref 80.0–100.0)
Monocytes Absolute: 0.7 10*3/uL (ref 0.1–1.0)
Monocytes Relative: 13 %
Neutro Abs: 2.9 10*3/uL (ref 1.7–7.7)
Neutrophils Relative %: 59 %
Platelet Count: 129 10*3/uL — ABNORMAL LOW (ref 150–400)
RBC: 3.38 MIL/uL — ABNORMAL LOW (ref 4.22–5.81)
RDW: 13.3 % (ref 11.5–15.5)
WBC Count: 4.9 10*3/uL (ref 4.0–10.5)
nRBC: 0 % (ref 0.0–0.2)

## 2020-01-29 ENCOUNTER — Telehealth: Payer: Self-pay

## 2020-01-29 LAB — IRON AND TIBC
Iron: 46 ug/dL (ref 42–163)
Saturation Ratios: 17 % — ABNORMAL LOW (ref 20–55)
TIBC: 275 ug/dL (ref 202–409)
UIBC: 229 ug/dL (ref 117–376)

## 2020-01-29 LAB — FERRITIN: Ferritin: 75 ng/mL (ref 24–336)

## 2020-01-29 NOTE — Progress Notes (Signed)
Hematology and Oncology Follow Up Visit  Aaron Cunningham [id number] [birth year] 55 y.o. 01/29/2020   Principle Diagnosis:  1. 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 History of DVT right lower extremity and PE 3. 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  Lovenox 100 mg subcu daily  2. Phlebotomy to maintain hematocrit below 45%   Interim History:  Aaron Cunningham is here today for a long awaited follow-up.  We have not seen him for almost 2 years.  Unfortunately, things have not been going all that well for him.  His wife passed away.  He I think, lost his job.  He has been drinking quite a bit.  He has been smoking quite a bit.  He apparently ended up in the emergency room down in [place].  He said he could not walk.  He said he can go to the bathroom.  He apparently had not been taking his Arixtra.  He was found to have on radiologic studies, a filter that was clotted off.  It does not sound this is anything that really is new.  Apparently, there was no thromboembolic disease in his legs.  There was some clot extending into the renal veins.  Again, I do not think this is anything that was really new.  He was told that he had kidney failure.  I think he had a Foley catheter put in only temporarily.  He was never admitted.  The emergency room doctor called [place].  We got him in.  He comes in with his daughter.  He actually looks pretty good.  He has no problems with his kidneys.  His his BUN and creatinine are okay.  He is still smoking.  He is still drinking.  Unfortunately, I do not have any of the scans to look at that were done in [place].  He has some swelling in the right leg.  This certainly could be from the IVC filter clot.  He is on Lovenox now.  He is on Lovenox at 100 mg a day.  He says that he is taking the Lovenox daily.  I spoke to one of our interventional  radiologist.  We will get a CT of the abdomen and pelvis.  Hopefully, this will tell us how much clot he has on the filter.  Maybe, we can then figure out how we can try to decrease the clot burden.  This might help his right leg.  I told him that he really needs to stop drinking.  He also needs to stop smoking.  His albumin is only 3.  I suspect this probably is a indicator of his decreased liver synthetic function.  Currently, I would say his performance status is ECOG 2.   Medications:  Allergies as of 01/28/2020   No Known Allergies     Medication List       Accurate as of January 28, 2020 11:59 PM. If you have any questions, ask your nurse or doctor.        STOP taking these medications   Belsomra 10 MG Tabs Generic drug: Suvorexant Stopped by: Aaron Macho, MD   buPROPion 100 MG 12 hr tablet Commonly known as: WELLBUTRIN SR Stopped by: Aaron Macho, MD   fondaparinux 7.5 MG/0.6ML Soln injection Commonly known as: ARIXTRA Stopped by: Aaron Macho, MD   pantoprazole 40 MG tablet Commonly known as: PROTONIX Stopped  by: Aaron MachoPeter R Linh Johannes, MD     TAKE these medications   amLODipine 5 MG tablet Commonly known as: NORVASC Take 5 mg by mouth daily.   enoxaparin 100 MG/ML injection Commonly known as: LOVENOX Inject 100 mg into the skin daily.   EpiPen 2-Pak 0.3 mg/0.3 mL Soaj injection Generic drug: EPINEPHrine 0.3 mg once as needed.   escitalopram 10 MG tablet Commonly known as: LEXAPRO Take 1 tablet (10 mg total) by mouth daily.   ferrous sulfate 325 (65 FE) MG tablet Take 325 mg by mouth daily with breakfast.   folic acid 1 MG tablet Commonly known as: FOLVITE TAKE ONE TABLET BY MOUTH EVERY EVENING   Integra Plus Caps Take 1 capsule by mouth daily.   lisinopril 40 MG tablet Commonly known as: ZESTRIL Take 40 mg by mouth daily. What changed: Another medication with the same name was removed. Continue taking this medication, and follow the  directions you see here. Changed by: Aaron MachoPeter R Solmon Bohr, MD   MagOx 400 400 (241.3 Mg) MG tablet Generic drug: magnesium oxide TK PO QD   metoprolol tartrate 25 MG tablet Commonly known as: LOPRESSOR TK 1 T PO BID   olmesartan 40 MG tablet Commonly known as: BENICAR Take 1 tablet (40 mg total) by mouth daily.   omeprazole 40 MG capsule Commonly known as: PRILOSEC   potassium chloride 20 MEQ packet Commonly known as: KLOR-CON Take 20 mEq by mouth.   ProAir HFA 108 (90 Base) MCG/ACT inhaler Generic drug: albuterol inhale TWO PUFF EVERY 4 TO 6 HOURS AS NEEDED FOR coughing/ wheezing/ SHORTNESS OF BREATH   sucralfate 1 GM/10ML suspension Commonly known as: CARAFATE TK 10 ML PO QID BEFORE MEALS AND HS   tamsulosin 0.4 MG Caps capsule Commonly known as: FLOMAX TAKE ONE CAPSULE BY MOUTH EVERY EVENING       Allergies: No Known Allergies  Past Medical History, Surgical history, Social history, and Family History were reviewed and updated.  Review of Systems: Review of Systems  Constitutional: Positive for malaise/fatigue.  HENT: Negative.   Eyes: Negative.   Respiratory: Negative.   Cardiovascular: Positive for leg swelling.  Gastrointestinal: Positive for abdominal pain.  Genitourinary: Positive for dysuria.  Musculoskeletal: Positive for joint pain and myalgias.  Skin: Negative.   Neurological: Positive for weakness.  Endo/Heme/Allergies: Negative.   Psychiatric/Behavioral: Negative.      Physical Exam:  weight is 165 lb (74.8 kg). His oral temperature is 97.9 F (36.6 C). His blood pressure is 139/84 and his pulse is 74. His respiration is 18 and oxygen saturation is 100%.   Wt Readings from Last 3 Encounters:  01/28/20 165 lb (74.8 kg)  05/07/18 143 lb 8 oz (65.1 kg)  10/09/17 152 lb (68.9 kg)    Physical Exam Vitals reviewed.  HENT:     Head: Normocephalic and atraumatic.     Mouth/Throat:     Mouth: Oropharynx is clear and moist.  Eyes:      Extraocular Movements: EOM normal.     Pupils: Pupils are equal, round, and reactive to light.  Cardiovascular:     Rate and Rhythm: Normal rate and regular rhythm.     Heart sounds: Normal heart sounds.  Pulmonary:     Effort: Pulmonary effort is normal.     Breath sounds: Normal breath sounds.  Abdominal:     General: Bowel sounds are normal.     Palpations: Abdomen is soft.  Musculoskeletal:        General:  No tenderness, deformity or edema. Normal range of motion.     Cervical back: Normal range of motion.     Comments: There is some swelling in the lower extremities.  There is more swelling in the right lower extremity than the left lower extremity.  This is somewhat pitting.  He has some decreased range of motion of the joints in his legs.  This is more pronounced on the right side.  Lymphadenopathy:     Cervical: No cervical adenopathy.  Skin:    General: Skin is warm and dry.     Findings: No erythema or rash.  Neurological:     Mental Status: He is alert and oriented to person, place, and time.  Psychiatric:        Mood and Affect: Mood and affect normal.        Behavior: Behavior normal.        Thought Content: Thought content normal.        Judgment: Judgment normal.      Lab Results  Component Value Date   WBC 4.9 01/28/2020   HGB 12.9 (L) 01/28/2020   HCT 37.9 (L) 01/28/2020   MCV 112.1 (H) 01/28/2020   PLT 129 (L) 01/28/2020   Lab Results  Component Value Date   FERRITIN 245 05/01/2018   IRON 43 05/01/2018   TIBC 185 (L) 05/01/2018   UIBC 142 05/01/2018   IRONPCTSAT 23 05/01/2018   Lab Results  Component Value Date   RBC 3.38 (L) 01/28/2020   No results found for: KPAFRELGTCHN, LAMBDASER, KAPLAMBRATIO No results found for: Loel Lofty, IGMSERUM No results found for: Marda Stalker, SPEI   Chemistry      Component Value Date/Time   NA 141 01/28/2020 1145   NA 145 01/20/2017 1113   NA 143  02/19/2016 0952   K 3.9 01/28/2020 1145   K 3.7 01/20/2017 1113   K 3.9 02/19/2016 0952   CL 104 01/28/2020 1145   CL 103 01/20/2017 1113   CO2 27 01/28/2020 1145   CO2 30 01/20/2017 1113   CO2 28 02/19/2016 0952   BUN 7 01/28/2020 1145   BUN 7 01/20/2017 1113   BUN 8.3 02/19/2016 0952   CREATININE 1.05 01/28/2020 1145   CREATININE 0.9 01/20/2017 1113   CREATININE 1.0 02/19/2016 0952      Component Value Date/Time   CALCIUM 8.6 (L) 01/28/2020 1145   CALCIUM 9.5 01/20/2017 1113   CALCIUM 9.0 02/19/2016 0952   ALKPHOS 50 01/28/2020 1145   ALKPHOS 59 01/20/2017 1113   ALKPHOS 122 02/19/2016 0952   AST 21 01/28/2020 1145   AST 51 (H) 02/19/2016 0952   ALT 16 01/28/2020 1145   ALT 28 01/20/2017 1113   ALT 47 02/19/2016 0952   BILITOT 0.6 01/28/2020 1145   BILITOT 0.69 02/19/2016 0952       Impression and Plan: Mr. 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  Unfortunately, because of certain social situations, he was somewhat lax in taking the Arixtra.  He went to the emergency room.  He apparently has a clot on the IVC filter.  Again I am not sure if this really is new or not.  Thankfully there are no thrombi in the lower extremities.  We will get a dedicated CT scan of the abdomen pelvis looking at the IVC filter thrombus.  If this is significant, we will make the referral over to interventional radiology to see how they might be able to help [place] out.  I suspect the swelling in the right leg probably is related to thrombus in the right iliac vein.  Again, he has to stop drinking.  He has to stop smoking.  I talked to him about this at length.  His daughter understood all of this.  She is a Advice worker with [employer].  I just feel bad for what he has been through.  He has been through a lot of personal crises.  I realize that this is  been very tough on him.  Hopefully, we will be able to make some improvements in his quality of life.  We can see about getting his right leg less swollen hopefully with decrease in the clot burden in the IVC.  I would like to have him come back to see [place] in about 3 weeks or so.  He promises that he will do the Lovenox daily.    This is quite complicated.  I spent about 45 minutes or so with he and his daughter.    Aaron Macho, MD 1/12/20228:07 AM

## 2020-01-29 NOTE — Telephone Encounter (Signed)
No 01/28/20 LOS entered,,,, aom

## 2020-01-31 ENCOUNTER — Ambulatory Visit (HOSPITAL_BASED_OUTPATIENT_CLINIC_OR_DEPARTMENT_OTHER)
Admission: RE | Admit: 2020-01-31 | Discharge: 2020-01-31 | Disposition: A | Discharge status: Home / Self Care | Payer: BC Managed Care – PPO | Source: Ambulatory Visit | Attending: Hematology & Oncology | Admitting: Hematology & Oncology

## 2020-01-31 ENCOUNTER — Telehealth: Payer: Self-pay

## 2020-01-31 ENCOUNTER — Other Ambulatory Visit: Payer: Self-pay

## 2020-01-31 ENCOUNTER — Ambulatory Visit (HOSPITAL_BASED_OUTPATIENT_CLINIC_OR_DEPARTMENT_OTHER): Admission: RE | Admit: 2020-01-31 | Payer: BC Managed Care – PPO | Source: Ambulatory Visit

## 2020-01-31 ENCOUNTER — Other Ambulatory Visit: Payer: Self-pay | Admitting: Hematology & Oncology

## 2020-01-31 DIAGNOSIS — I82412 Acute embolism and thrombosis of left femoral vein: Secondary | ICD-10-CM | POA: Diagnosis not present

## 2020-01-31 MED ORDER — IOHEXOL 350 MG/ML SOLN
100.0000 mL | Freq: Once | INTRAVENOUS | Status: AC | PRN
  Administered 2020-01-31: 100 mL via INTRAVENOUS

## 2020-01-31 NOTE — Telephone Encounter (Signed)
S/w pt regarding his ct angio and he can make the appt today    aom

## 2020-02-04 NOTE — Telephone Encounter (Signed)
Appointments scheduled per 1/11 los entered late on 1/12.

## 2020-02-07 ENCOUNTER — Other Ambulatory Visit: Payer: Self-pay | Admitting: *Deleted

## 2020-02-07 ENCOUNTER — Other Ambulatory Visit: Payer: Self-pay | Admitting: Hematology & Oncology

## 2020-02-07 DIAGNOSIS — I82401 Acute embolism and thrombosis of unspecified deep veins of right lower extremity: Secondary | ICD-10-CM

## 2020-02-07 DIAGNOSIS — I82513 Chronic embolism and thrombosis of femoral vein, bilateral: Secondary | ICD-10-CM

## 2020-02-07 MED ORDER — TRAMADOL HCL 50 MG PO TABS: ORAL_TABLET | ORAL | 0 refills | Status: DC

## 2020-02-17 ENCOUNTER — Telehealth: Payer: Self-pay

## 2020-02-18 ENCOUNTER — Other Ambulatory Visit: Payer: Self-pay | Admitting: Hematology and Oncology

## 2020-02-18 MED ORDER — OXYCODONE HCL 5 MG PO TABS: 5.0000 mg | ORAL_TABLET | ORAL | 0 refills | Status: DC | PRN

## 2020-02-18 NOTE — Telephone Encounter (Signed)
Verified does with Dr. Gilman Buttner and relayed message to Jacki Cones with [facility].

## 2020-02-19 ENCOUNTER — Telehealth: Payer: Self-pay

## 2020-02-19 NOTE — Telephone Encounter (Signed)
Returned pts call, pt has covid and was dx on 02/18/20.  Pt will actually f./u with dr Gilman Buttner on 2/24 in [place] as she saw him in the hosp.  Erie Noe verified the information and his 2/4 with dr Myna Hidalgo will be cancelled     [facility]

## 2020-02-20 DIAGNOSIS — D519 Vitamin B12 deficiency anemia, unspecified: Secondary | ICD-10-CM | POA: Insufficient documentation

## 2020-02-20 HISTORY — DX: Vitamin B12 deficiency anemia, unspecified: D51.9

## 2020-02-21 ENCOUNTER — Inpatient Hospital Stay: Payer: BC Managed Care – PPO

## 2020-02-21 ENCOUNTER — Inpatient Hospital Stay: Payer: BC Managed Care – PPO | Admitting: Hematology & Oncology

## 2020-02-26 ENCOUNTER — Other Ambulatory Visit: Payer: Self-pay | Admitting: Oncology

## 2020-02-26 DIAGNOSIS — I82401 Acute embolism and thrombosis of unspecified deep veins of right lower extremity: Secondary | ICD-10-CM

## 2020-03-10 NOTE — Progress Notes (Signed)
[facility]  [address] [place],  [place]  [address] [phone]  Clinic Day:  03/12/2020  Referring physician: Olive Bass, MD  This document serves as a record of services personally performed by Gery Pray, MD. It was created on their behalf by Curry,Lauren E, a trained medical scribe. The creation of this record is based on the scribe's personal observations and the provider's statements to them.  CHIEF COMPLAINT:  CC: History of multiple recurrences of deep venous thrombosis  Current Treatment:  Lovenox  1 mg/kg BID   HISTORY OF PRESENT ILLNESS:  Aaron Cunningham is a 55 y.o. male with a history of recurrent deep venous thrombosis and episode of pulmonary emboli who has an IVC filter in place.  His 1st episode was in December of 2016 and it has always been the right lower extremity.  He was admitted earlier in January 2022 for recurrent DVT but we are not sure about his compliance with the Arixtra.  He presented with worsening deep venous thrombosis of the entire right lower extremity as well as the left with thrombosis of the inferior neck cava filter, with thrombus extending above the filter to the level of the renal vein.  He was having severe pain and 3 to 4+ edema of the right lower extremity, and was unable to stand or ambulate.  He was admitted for IV heparin and was then switched to Lovenox at 1 milligram/kilogram twice daily rather than 1.5 milligrams/kilogram once daily.  The Doppler confirmed extensive deep venous thrombosis throughout the right lower extremity which is extensive and occlusive.  He has seen a vascular surgeon in the past and has had a stent placed as well as the IVC filter at [facility].  A vascular surgeon was contacted when the patient was in the emergency room and did not have any specific recommendations at this time.  He has seen vascular surgeons in the past.  I ordered oxycodone for the patient's pain  and it does give him some relief.  He continues to smoke and was drinking alcohol heavily previously.  At some point, he should probably have removal of the IVC filter, but I feel it would be too risky at this time. He was discharged on January 29th.  His daughter has undergone genetic testing and does have a MTHFR, homozygous mutation.  I went through his records from Dr. Arlan Organ, and did not see that he was checked for hypercoagulable state, but he was negative for polycythemia vera.    INTERVAL HISTORY:  Aaron Cunningham states that he continues Lovenox twice daily.  His lower extremities are still swollen, but improved.   However, this is severely painful at times and he has to keep his legs elevated most of the day.  He continues to smoke 1 pack per day, but he has quit drinking alcohol and has not drank in 6 weeks.  He states that his blood pressure was up to 220/102 yesterday.  He was evaluated by Dr. Sol Passer on February 15th and his note states that the patient should be taking lisinopril 40 mg daily and amlodipine 5 mg daily in the mornings, and to discontinue metoprolol.  However, he has been taking the metoprolol 25 mg BID alone.  I called Dr. Sol Passer to clarify the instructions, and informed the patient.  He will now start back on lisinopril and amlodipine, and he will taper off the metoprolol.  He did have COVID-19, 2-3 weeks ago and is  still struggling with occasional productive cough with white sputum, and wheezing, worse at night.  His  appetite is good, and he is eating well.  He denies fever, chills or other signs of infection.  He denies nausea, vomiting, bowel issues, or abdominal pain.  He denies sore throat or chest pain.  REVIEW OF SYSTEMS:  Review of Systems  Constitutional: Negative.  Negative for appetite change, chills, fatigue, fever and unexpected weight change.  HENT:  Negative.   Eyes: Negative.   Respiratory: Positive for cough (productive with white sputum) and wheezing (worse at  night). Negative for chest tightness, hemoptysis and shortness of breath.   Cardiovascular: Positive for leg swelling (of bilateral extremities, with pulsatile pain on the right). Negative for chest pain and palpitations.  Gastrointestinal: Negative.  Negative for abdominal distention, abdominal pain, blood in stool, constipation, diarrhea, nausea and vomiting.  Endocrine: Negative.   Genitourinary: Negative.  Negative for difficulty urinating, dysuria, frequency and hematuria.   Musculoskeletal: Negative for arthralgias, back pain, flank pain, gait problem and myalgias.       Pain of the bilateral lower extremities from the knee down, severe on the right; he even has involvement of the inner thighs  Skin: Negative.   Neurological: Negative.  Negative for dizziness, extremity weakness, gait problem, headaches, light-headedness, numbness, seizures and speech difficulty.  Hematological: Negative.   Psychiatric/Behavioral: Positive for depression (he continues to greive for his wife who died 5 years ago). Negative for sleep disturbance. The patient is not nervous/anxious.   All other systems reviewed and are negative.    VITALS:  Blood pressure (!) 180/89, pulse 69, temperature 97.9 F (36.6 C), temperature source Oral, resp. rate 18, height  (1.727 m), weight 169 lb 12.8 oz (77 kg), SpO2 98 %.  Wt Readings from Last 3 Encounters:  03/12/20 169 lb 12.8 oz (77 kg)  01/28/20 165 lb (74.8 kg)  05/07/18 143 lb 8 oz (65.1 kg)    Body mass index is 25.82 kg/m.  Performance status (ECOG): 1 - Symptomatic but completely ambulatory  PHYSICAL EXAM:  Physical Exam Constitutional:      General: He is not in acute distress.    Appearance: Normal appearance. He is normal weight.  HENT:     Head: Normocephalic and atraumatic.  Eyes:     General: No scleral icterus.    Extraocular Movements: Extraocular movements intact.     Conjunctiva/sclera: Conjunctivae normal.     Pupils: Pupils are  equal, round, and reactive to light.  Cardiovascular:     Rate and Rhythm: Normal rate and regular rhythm.     Pulses: Normal pulses.     Heart sounds: Normal heart sounds. No murmur heard. No friction rub. No gallop.   Pulmonary:     Effort: Pulmonary effort is normal. No respiratory distress.     Breath sounds: Wheezing (slight at the left base) present.  Abdominal:     General: Bowel sounds are normal. There is no distension.     Palpations: Abdomen is soft. There is no hepatomegaly, splenomegaly or mass.     Tenderness: There is no abdominal tenderness.  Musculoskeletal:        General: Normal range of motion.     Cervical back: Normal range of motion and neck supple.     Right lower leg: Edema (up to the mid thigh.  He has some mild warmth and erythema of the skin) present.     Left lower leg: 2+ Edema present.  Lymphadenopathy:     Cervical: No cervical adenopathy.  Skin:    General: Skin is warm and dry.  Neurological:     General: No focal deficit present.     Mental Status: He is alert and oriented to person, place, and time. Mental status is at baseline.  Psychiatric:        Mood and Affect: Mood normal.        Behavior: Behavior normal.        Thought Content: Thought content normal.        Judgment: Judgment normal.     LABS:   CBC Latest Ref Rng & Units 01/28/2020 05/01/2018 10/09/2017  WBC 4.0 - 10.5 K/uL 4.9 5.2 3.3(L)  Hemoglobin 13.0 - 17.0 g/dL 12.9(L) 11.4(L) 14.4  Hematocrit 39.0 - 52.0 % 37.9(L) 33.2(L) 41.0  Platelets 150 - 400 K/uL 129(L) 194 128(L)   CMP Latest Ref Rng & Units 01/28/2020 05/01/2018 10/09/2017  Glucose 70 - 99 mg/dL 332(R) 518(A) 416(S)  BUN 6 - 20 mg/dL 7 8 12   Creatinine 0.61 - 1.24 mg/dL 0.63 0.16  Sodium 135 - 145 mmol/L 141 139 141  Potassium 3.5 - 5.1 mmol/L 3.9 3.1(L) 3.8  Chloride 98 - 111 mmol/L 104 103 108  CO2 22 - 32 mmol/L 27 28 29   Calcium 8.9 - 10.3 mg/dL 0.10) 7.9(L) 9.1  Total Protein 6.5 - 8.1 g/dL )  9.3(A) 6.4  Total Bilirubin 0.3 - 1.2 mg/dL 0.6 0.6 0.7  Alkaline Phos 38 - 126 U/L 50 57 59  AST 15 - 41 U/L 21 29 38  ALT 0 - 44 U/L 16 39 32      Lab Results  Component Value Date   TIBC 275 01/28/2020   TIBC 185 (L) 05/01/2018   TIBC 289 10/09/2017   FERRITIN 75 01/28/2020   FERRITIN 245 05/01/2018   FERRITIN 182 10/09/2017   IRONPCTSAT 17 (L) 01/28/2020   IRONPCTSAT 23 05/01/2018   IRONPCTSAT 80 10/09/2017   Lab Results  Component Value Date   LDH 188 10/09/2017   LDH 183 08/15/2017   LDH 210 07/04/2017    STUDIES:   EXAM: CT ANGIOGRAPHY OF ABDOMINAL AORTA WITH ILIOFEMORAL RUNOFF  TECHNIQUE: Multidetector CT imaging of the abdomen, pelvis and lower extremities was performed using the standard protocol during bolus administration of intravenous contrast. Multiplanar CT image reconstructions and MIPs were obtained to evaluate the vascular anatomy.  CONTRAST:  80 cc Isovue 370  COMPARISON:  None.  FINDINGS: VASCULAR  Aorta: Moderate largely atheromatous plaque is seen within the infrarenal abdominal aorta. Normal aortic caliber. No aneurysm or dissection.  Celiac: Widely patent.  Normal anatomic configuration.  SMA: Widely patent.  Renals: Single left renal artery. Small accessory right lower pole renal artery. Main renal arteries are widely patent and demonstrate normal vascular morphology. No aneurysm.  IMA: Less than 50% stenosis of the inferior mesenteric artery at its origin. This vessel, however, is patent.  RIGHT Lower Extremity  Inflow: Right common iliac artery stenting has been performed with wide patency of the stented segment. External iliac artery is patent without evidence of hemodynamically significant stenosis, aneurysm, or dissection. Internal iliac artery is patent at its origin.  Outflow: Mild scattered mixed atherosclerotic plaque within the a distal superficial femoral artery without evidence of hemodynamically significant  stenosis. The right lower extremity arterial outflow is otherwise widely patent.  Runoff: Mild scattered mixed atherosclerotic plaque within the P1 segment of the popliteal artery without evidence of hemodynamically significant  stenosis. Lower extremity runoff is otherwise widely patent. Peroneal artery terminates at the level of the distal tibia. Anterior tibial artery forms the dorsalis pedis artery. Posterior tibial artery forms the plantar arch.  LEFT Lower Extremity  Inflow: Moderate mixed atherosclerotic plaque within the left common iliac artery without evidence of hemodynamically significant stenosis. Internal iliac artery is diseased, but patent. External iliac artery demonstrates mild atherosclerotic plaque without evidence of hemodynamically significant stenosis.  Outflow: Focal mixed atherosclerotic plaque results in a 50% stenosis of the superficial femoral artery at its origin. Scattered atherosclerotic plaque within the distal SFA does not result in hemodynamically significant stenosis.  Runoff: Variant anatomy with early takeoff of the anterior tibial artery from the P2 segment of the popliteal artery. Three-vessel runoff to the left foot. Anterior tibial artery forms the dorsalis pedis artery. Posterior tibial artery forms the plantar arch.  Veins: Not well opacified. However, relatively discrete margin of unopacified blood within the infrarenal inferior vena cava, best appreciated at the level of the renal veins suggests caval thrombus extending from the inferior vena cava filter cranially by approximately 3.5 cm to the level of the renal vein inflow. The infrarenal cava, and iliofemoral venous system appears engorged raising the question of intraluminal thrombus.  Review of the MIP images confirms the above findings.  NON-VASCULAR  Lower chest: The visualized lung bases are clear. Visualized heart and pericardium are unremarkable.  Hepatobiliary:  Cholecystectomy has been performed. At least mild hepatic steatosis. No enhancing liver lesion identified. No intra or extrahepatic biliary ductal dilation.  Pancreas: Unremarkable save for a moderate duodenal diverticulum within the pancreatic head.  Spleen: Heterogeneous enhancement likely related to phase of contrast enhancement. Spleen otherwise unremarkable.  Adrenals/Urinary Tract: The adrenal glands and kidneys are unremarkable. The bladder is decompressed, however, there is mild perivesicular inflammatory stranding identified suggesting an underlying diffuse infectious or inflammatory cystitis.  Stomach/Bowel: The stomach, small bowel, and large bowel are unremarkable. Appendix normal. No free intraperitoneal gas or fluid.  Lymphatic: No pathologic adenopathy within the abdomen and pelvis.  Reproductive: Prostate is unremarkable.  Other: Rectum unremarkable.  No abdominal wall hernia.  Musculoskeletal: Bilateral L5 pars defects are present without associated spondylolisthesis. No lytic or blastic bone lesions are identified.  IMPRESSION: VASCULAR  Moderate atheromatous plaque within the infrarenal abdominal aorta without evidence of plaque ulceration.  ocal 50% stenosis of the left superficial femoral artery at its origin. Otherwise widely patent lower extremity arterial inflow, outflow, and runoff with three-vessel runoff to the feet bilaterally.  Variant anatomy with early takeoff of the left anterior tibial artery from the P2 segment of the left popliteal artery.  Thrombosis of a TrapEase infrarenal inferior vena cava filter with thrombus extending above the filter by approximately 3.5 cm to the level of the renal vein inflow. Engorgement of the a ileo caval venous system proximal to the inferior vena cava filter raising the question of iliofemoral venous thrombosis. Lower extremity Doppler sonography of the a common femoral and external iliac arteries  is recommended as this will provide direct evaluation of the femoral vasculature and indirect evidence of central venous occlusion.  Ileo caval venous thrombosis would be better assessed with CT venography or MR venography (if there is underlying concern for contrast induced nephropathy).  NON-VASCULAR  Mild perivesicular inflammatory stranding suggesting an underlying diffuse infectious or inflammatory cystitis. The bladder is not distended. Correlation with urinalysis and urine culture may be helpful for further management.  Mild hepatic steatosis.    EXAM: CT  ABDOMEN AND PELVIS WITH CONTRAST  TECHNIQUE: Multidetector CT imaging of the abdomen and pelvis was performed using the standard protocol following bolus administration of intravenous contrast.  CONTRAST:  100 cc Isovue 370  COMPARISON:  01/31/2020  FINDINGS: Lower chest: The visualized lung bases are clear bilaterally. The visualized heart and pericardium are unremarkable.  Hepatobiliary: Cholecystectomy has been performed. Tiny scattered cysts are seen within the liver. No enhancing intrahepatic mass identified. No intra or extrahepatic biliary ductal dilation.  Pancreas: Unremarkable  Spleen: Unremarkable  Adrenals/Urinary Tract: The adrenal glands are unremarkable. The kidneys are normal. The bladder is decompressed. Mild bladder wall thickening is again noted circumferentially similar to multiple prior examinations.  Stomach/Bowel: Stomach is within normal limits. Appendix appears normal. No evidence of bowel wall thickening, distention, or inflammatory changes. No free intraperitoneal gas or fluid.  Vascular/Lymphatic: TrapEase filter again noted within the infrarenal inferior vena cava. Nonocclusive thrombus extends from the superior aspect of the filter to the level of the renal vein confluence, unchanged from prior examination. The infrarenal cava and iliofemoral venous system is suboptimally  opacified, but appears unchanged when compared to prior examination. Moderate mixed atherosclerotic plaque is seen within the infrarenal abdominal aorta and iliofemoral arterial vasculature, similar to that noted on prior examination. No aneurysm. Right common iliac artery stenting has been performed.  No pathologic adenopathy within the abdomen and pelvis.  Reproductive: Prostate is unremarkable.  Other: There is asymmetric right lower extremity subcutaneous edema, similar to that noted on prior examination. No abdominal wall hernia identified. Subcutaneous gas and soft tissue infiltration within the anterior abdominal wall likely relates to subcutaneous injection.  Musculoskeletal: Bilateral L5 pars defects are noted without associated spondylolisthesis. No acute bone abnormality.  IMPRESSION: No acute intra-abdominal pathology identified.  Stable nonocclusive thrombus extending above the level of the indwelling inferior vena cava filter to the level of the renal vein confluence.  Stable mild circumferential bladder wall thickening. The bladder is decompressed.  Aortic Atherosclerosis (ICD10-I70.0).    EXAM: RIGHT LOWER EXTREMITY VENOUS DOPPLER ULTRASOUND  TECHNIQUE: Gray-scale sonography with graded compression, as well as color Doppler and duplex ultrasound were performed to evaluate the lower extremity deep venous systems from the level of the common femoral vein and including the common femoral, femoral, profunda femoral, popliteal and calf veins including the posterior tibial, peroneal and gastrocnemius veins when visible. The superficial great saphenous vein was also interrogated. Spectral Doppler was utilized to evaluate flow at rest and with distal augmentation maneuvers in the common femoral, femoral and popliteal veins.  COMPARISON:  Prior bilateral lower extremity venous duplex ultrasound on 01/22/2020, CT of the abdomen and pelvis on 02/08/2020 and CTA  on 01/22/2020.  FINDINGS: Contralateral Common Femoral Vein: Occlusive thrombus present in the left common femoral vein.  Common Femoral Vein: Occlusive thrombus present.  Saphenofemoral Junction: Occlusive thrombus present.  Profunda Femoral Vein: The profunda femoral vein is not well visualized.  Femoral Vein: Occlusive thrombus present.  Popliteal Vein: Occlusive thrombus present.  Calf Veins: Thrombus present with some flow noted in right calf veins.  Superficial Great Saphenous Vein: Thrombus in the right great saphenous vein. Some of this thrombus is more echogenic and may be partially chronic.  Venous Reflux:  None.  Other Findings:  None.  IMPRESSION: Extensive deep vein thrombosis throughout the right lower extremity with some flow in calf veins but otherwise extensive occlusive thrombus. Also noted is occlusive thrombus in the contralateral left common femoral vein.    HISTORY:   Past  Medical History:  Diagnosis Date  . Anxiety   . Bronchiolitis Feb. 2016  . DVT (deep venous thrombosis) (HCC)   . GERD (gastroesophageal reflux disease)   . Hyperlipidemia   . Peripheral vascular disease (HCC)   . Pneumonia Feb. 2016  . PVD (peripheral vascular disease) (HCC) 01/09/2015   Right iliac stent 2007    Past Surgical History:  Procedure Laterality Date  . CHOLECYSTECTOMY  10/2015  . HAND SURGERY    . IVC FILTER PLACEMENT ([facility] HX)  2016   . ROTATOR CUFF REPAIR    . THROMBOLYSIS Right Nov. 9, 2010   Lower Extrim.  . TONSILLECTOMY      Family History  Problem Relation Age of Onset  . Deep vein thrombosis Father   . Heart disease Father 27       Before age 21  . Hyperlipidemia Father   . Heart attack Father   . Stroke Father     Social History:  reports that he has been smoking cigarettes. He has a 30.00 pack-year smoking history. He has never used smokeless tobacco. He reports previous alcohol use. He reports that he does not use drugs.The  patient is alone today.  He is widowed after his wife died of metastatic anal cancer 5 years ago.  He has 2 children.  He lives alone and smokes 1 ppd.  He has stopped drinking alcohol for 6 weeks now.  He is disabled.  Allergies: No Known Allergies  Current Medications: Current Outpatient Medications  Medication Sig Dispense Refill  . ipratropium-albuterol (DUONEB) 0.5-2.5 (3) MG/3ML SOLN INHALE THE CONTENT OF 1 VIAL Q 4 H PRN WHEEZING    . metoprolol tartrate (LOPRESSOR) 25 MG tablet Take 25 mg by mouth 2 (two) times daily.    Marland Kitchen buPROPion (WELLBUTRIN SR) 100 MG 12 hr tablet Take 100 mg by mouth every morning.    . enoxaparin (LOVENOX) 80 MG/0.8ML injection     . EPINEPHrine (EPIPEN 2-PAK) 0.3 mg/0.3 mL IJ SOAJ injection 0.3 mg once as needed.    Marland Kitchen escitalopram (LEXAPRO) 10 MG tablet Take 1 tablet (10 mg total) by mouth daily. 90 tablet 2  . FeFum-FePoly-FA-B Cmp-C-Biot (INTEGRA PLUS) CAPS Take 1 capsule by mouth daily. 90 capsule 2  . folic acid (FOLVITE) 1 MG tablet TAKE ONE TABLET BY MOUTH EVERY EVENING 90 tablet 6  . magnesium oxide (MAG-OX) 400 MG tablet Take by mouth.    Marland Kitchen omeprazole (PRILOSEC) 20 MG capsule Take 20 mg by mouth daily.    Marland Kitchen omeprazole (PRILOSEC) 40 MG capsule     . potassium chloride (KLOR-CON) 20 MEQ packet Take 20 mEq by mouth.    Marland Kitchen PROAIR HFA 108 (90 Base) MCG/ACT inhaler inhale TWO PUFF EVERY 4 TO 6 HOURS AS NEEDED FOR coughing/ wheezing/ SHORTNESS OF BREATH  0  . tamsulosin (FLOMAX) 0.4 MG CAPS capsule TAKE ONE CAPSULE BY MOUTH EVERY EVENING 30 capsule 2   No current facility-administered medications for this visit.     ASSESSMENT & PLAN:   Assessment:   1.  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  We are unsure of his compliance.  He has been seen by Dr. Arlan Organ in the past, and was negative for polycythemia vera.  I have agreed to be his local hematologist since he resides in [place].   I  recommend that he stay on Lovenox at 80 mg subcu twice daily.  I will check him for hypercoagulable state  and will order factor IV Leiden, prothrombin gene mutation, antithrombin III, protein C, protein S, lupus anticoagulant, beta glycoprotein, and anticardiolipin antibody.    2.  Episode of pulmonary emboli.  3. Extensive thrombosis of the right lower extremity throughout the leg and occlusive thrombus in the left common femoral vein as well.  This has been debilitating and painful.  He was given Tramadol without much relief, and so have been using Tylenol.  4. Tobacco abuse.  He has smoked 1 pack per day for at least 30 years.  5. Alcohol abuse.  He has stopped drinking alcohol for over 6 weeks.   6. 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  Evaluation did reveal B12 deficiency.  He received 1 injection in the hospital and is now on oral supplement.  We will recheck a level today.  Plan: This is an unfortunate young man with recurrences of deep venous thrombosis.  He has extensive thrombosis of the right lower extremity throughout the leg and occlusive thrombus in the left common femoral vein, which has been debilitating.  He will need to be evaluated by a vascular surgeon for further suggestions, and so I will refer him to Dr. Myra Gianotti.  I have agreed to be his local hematologist since he resides in [place].  He has been evaluated by Dr. Myna Hidalgo in the past and was evaluated for polycythemia vera and was negative.  I will check him for hypercoagulable state, so will order  factor IV Leiden, prothrombin, antithrombin III, protein C, protein S, lupus anticoagulant, beta glycoprotein, and anticardiolipin antibody.  He knows to continue Lovenox at 80 mg subcu twice daily.  We will see him back in 5 months with CBC, CMP and B12 for repeat evaluation.  He understands and agrees with this plan of care.  I have answered his questions and he knows to call with any  concerns.   I provided 45 minutes of face-to-face time during this this encounter and > 50% was spent counseling as documented under my assessment and plan.    Dellia Beckwith, MD [facility] [address] [place] [address] Dept: [phone] Dept Fax: [phone]   I, Foye Deer, am acting as scribe for Dellia Beckwith, MD  I have reviewed this report as typed by the medical scribe, and it is complete and accurate.  Nicoletta Ba

## 2020-03-12 ENCOUNTER — Other Ambulatory Visit: Payer: Self-pay | Admitting: Oncology

## 2020-03-12 ENCOUNTER — Telehealth: Payer: Self-pay | Admitting: Oncology

## 2020-03-12 ENCOUNTER — Inpatient Hospital Stay: Payer: BC Managed Care – PPO | Attending: Oncology

## 2020-03-12 ENCOUNTER — Other Ambulatory Visit: Payer: Self-pay

## 2020-03-12 ENCOUNTER — Encounter: Payer: Self-pay | Admitting: Oncology

## 2020-03-12 ENCOUNTER — Inpatient Hospital Stay: Payer: BC Managed Care – PPO

## 2020-03-12 ENCOUNTER — Inpatient Hospital Stay (INDEPENDENT_AMBULATORY_CARE_PROVIDER_SITE_OTHER): Payer: BC Managed Care – PPO | Admitting: Oncology

## 2020-03-12 ENCOUNTER — Other Ambulatory Visit: Payer: Self-pay | Admitting: Hematology and Oncology

## 2020-03-12 VITALS — BP 180/89 | HR 69 | Temp 97.9°F | Resp 18 | Ht 68.0 in | Wt 169.8 lb

## 2020-03-12 DIAGNOSIS — Z789 Other specified health status: Secondary | ICD-10-CM | POA: Insufficient documentation

## 2020-03-12 DIAGNOSIS — D519 Vitamin B12 deficiency anemia, unspecified: Secondary | ICD-10-CM

## 2020-03-12 DIAGNOSIS — I82401 Acute embolism and thrombosis of unspecified deep veins of right lower extremity: Secondary | ICD-10-CM | POA: Diagnosis not present

## 2020-03-12 DIAGNOSIS — D510 Vitamin B12 deficiency anemia due to intrinsic factor deficiency: Secondary | ICD-10-CM

## 2020-03-12 DIAGNOSIS — Z7289 Other problems related to lifestyle: Secondary | ICD-10-CM | POA: Insufficient documentation

## 2020-03-12 LAB — BASIC METABOLIC PANEL
BUN: 11 (ref 4–21)
CO2: 33 — AB (ref 13–22)
Chloride: 103 (ref 99–108)
Creatinine: 0.9 (ref 0.6–1.3)
Glucose: 102
Potassium: 3.7 (ref 3.4–5.3)
Sodium: 140 (ref 137–147)

## 2020-03-12 LAB — CBC AND DIFFERENTIAL
HCT: 36 — AB (ref 41–53)
Hemoglobin: 12.3 — AB (ref 13.5–17.5)
Neutrophils Absolute: 2.01
Platelets: 158 (ref 150–399)
WBC: 4.9

## 2020-03-12 LAB — HEPATIC FUNCTION PANEL
ALT: 9 — AB (ref 10–40)
AST: 15 (ref 14–40)
Alkaline Phosphatase: 40 (ref 25–125)
Bilirubin, Total: 0.5

## 2020-03-12 LAB — COMPREHENSIVE METABOLIC PANEL
Albumin: 3.7 (ref 3.5–5.0)
Calcium: 8.6 — AB (ref 8.7–10.7)

## 2020-03-12 LAB — CBC: RBC: 3.46 — AB (ref 3.87–5.11)

## 2020-03-12 NOTE — Telephone Encounter (Signed)
Per 2/24 LOS, patient scheduled for July Appt's.  Gave patient Appt Summary

## 2020-03-13 ENCOUNTER — Other Ambulatory Visit: Payer: Self-pay | Admitting: Hematology and Oncology

## 2020-03-13 ENCOUNTER — Other Ambulatory Visit: Payer: Self-pay

## 2020-03-13 DIAGNOSIS — I10 Essential (primary) hypertension: Secondary | ICD-10-CM

## 2020-03-13 MED ORDER — LISINOPRIL 40 MG PO TABS: 40.0000 mg | ORAL_TABLET | Freq: Every day | ORAL | 1 refills | Status: DC

## 2020-03-13 MED ORDER — TRAMADOL HCL 50 MG PO TABS: 50.0000 mg | ORAL_TABLET | Freq: Four times a day (QID) | ORAL | 0 refills | Status: DC | PRN

## 2020-03-13 MED ORDER — AMLODIPINE BESYLATE 5 MG PO TABS: 5.0000 mg | ORAL_TABLET | Freq: Every day | ORAL | 0 refills | Status: DC

## 2020-03-17 NOTE — Progress Notes (Signed)
Patient called me and wanted to speak with me about apply for disability. I let him know I would be happy to help him complete the application on-line, or he could call the SS office and set up an appointment to complete with them. He took the number for SS and was going to discuss with his daughter and call me back if he wants to come in and have me help him with application.

## 2020-03-30 ENCOUNTER — Encounter: Payer: Self-pay | Admitting: Oncology

## 2020-03-30 NOTE — Progress Notes (Unsigned)
Received Authorization from [facility]) for special lab CPT [phone] (Factor V)  Auth#[id number] Valid until 04/11/2020

## 2020-04-01 ENCOUNTER — Telehealth: Payer: Self-pay | Admitting: Oncology

## 2020-04-01 ENCOUNTER — Inpatient Hospital Stay: Payer: BC Managed Care – PPO | Attending: Oncology

## 2020-04-01 ENCOUNTER — Other Ambulatory Visit: Payer: Self-pay

## 2020-04-01 ENCOUNTER — Inpatient Hospital Stay: Payer: BC Managed Care – PPO

## 2020-04-01 DIAGNOSIS — F17210 Nicotine dependence, cigarettes, uncomplicated: Secondary | ICD-10-CM | POA: Diagnosis not present

## 2020-04-01 DIAGNOSIS — Z86718 Personal history of other venous thrombosis and embolism: Secondary | ICD-10-CM | POA: Diagnosis not present

## 2020-04-01 DIAGNOSIS — E538 Deficiency of other specified B group vitamins: Secondary | ICD-10-CM | POA: Diagnosis not present

## 2020-04-01 DIAGNOSIS — Z86711 Personal history of pulmonary embolism: Secondary | ICD-10-CM | POA: Diagnosis present

## 2020-04-01 DIAGNOSIS — I82401 Acute embolism and thrombosis of unspecified deep veins of right lower extremity: Secondary | ICD-10-CM

## 2020-04-01 DIAGNOSIS — D649 Anemia, unspecified: Secondary | ICD-10-CM | POA: Insufficient documentation

## 2020-04-09 LAB — FACTOR 5 LEIDEN

## 2020-04-27 ENCOUNTER — Telehealth: Payer: Self-pay

## 2020-04-27 NOTE — Telephone Encounter (Signed)
Pt called to report, "I'm having nose bleeds that take about a while to stop. This has been happening for about 1 -1.5 weeks. I was just discharged Thursday from the hospital because of renal failure and low blood pressure. They took me off all my blood pressure meds. I don't know if I need my labs checked since I'm on Lovenox twice day". I called pt back and asked about the nose bleeds. I asked how often he was having the nosebleeds? He replied"once a day". I asked what he is doing to stop the bleed? He replied, "pinch my nose closed for about 20 minutes" I made sure he was also holding his head in downward position @ the same time. I told him he could also apply ice pack if needed. I reported the above to [facility]. Kelli,PA, reviewed pt's labs while IP & on discharge. She recommends that if nose bleeds continue that he see his PCP for evaluation and of course if unable to get bleeding to stop to report to ED as soon as possible. Pt verbalized understanding.

## 2020-04-29 DIAGNOSIS — R197 Diarrhea, unspecified: Secondary | ICD-10-CM | POA: Insufficient documentation

## 2020-04-29 DIAGNOSIS — Z87448 Personal history of other diseases of urinary system: Secondary | ICD-10-CM | POA: Insufficient documentation

## 2020-04-30 DIAGNOSIS — R600 Localized edema: Secondary | ICD-10-CM

## 2020-04-30 HISTORY — DX: Localized edema: R60.0

## 2020-05-04 ENCOUNTER — Encounter: Payer: BC Managed Care – PPO | Admitting: Surgery

## 2020-05-11 ENCOUNTER — Encounter: Payer: BC Managed Care – PPO | Admitting: Surgery

## 2020-05-12 DIAGNOSIS — Z0000 Encounter for general adult medical examination without abnormal findings: Secondary | ICD-10-CM | POA: Insufficient documentation

## 2020-05-12 DIAGNOSIS — Z01818 Encounter for other preprocedural examination: Secondary | ICD-10-CM | POA: Insufficient documentation

## 2020-05-25 ENCOUNTER — Encounter: Payer: BC Managed Care – PPO | Admitting: Surgery

## 2020-05-25 ENCOUNTER — Encounter: Payer: Self-pay | Admitting: *Deleted

## 2020-06-08 DIAGNOSIS — K4090 Unilateral inguinal hernia, without obstruction or gangrene, not specified as recurrent: Secondary | ICD-10-CM

## 2020-06-08 HISTORY — DX: Unilateral inguinal hernia, without obstruction or gangrene, not specified as recurrent: K40.90

## 2020-06-23 ENCOUNTER — Other Ambulatory Visit: Payer: Self-pay | Admitting: Hematology & Oncology

## 2020-06-23 DIAGNOSIS — I2692 Saddle embolus of pulmonary artery without acute cor pulmonale: Secondary | ICD-10-CM

## 2020-06-23 DIAGNOSIS — I824Z2 Acute embolism and thrombosis of unspecified deep veins of left distal lower extremity: Secondary | ICD-10-CM

## 2020-06-23 DIAGNOSIS — I82413 Acute embolism and thrombosis of femoral vein, bilateral: Secondary | ICD-10-CM

## 2020-07-01 ENCOUNTER — Other Ambulatory Visit: Payer: Self-pay | Admitting: Hematology & Oncology

## 2020-07-01 DIAGNOSIS — F419 Anxiety disorder, unspecified: Secondary | ICD-10-CM

## 2020-07-17 ENCOUNTER — Telehealth: Payer: Self-pay

## 2020-07-17 NOTE — Telephone Encounter (Signed)
Per Tracey Harries. PA  No he cannot at this time.  He is scheduled to see Dr. Gilman Buttner at the end of the month, he can ask her about this change at that time.  I called Sherry @ Dr. Robyne Peers and gave her the recommendation.

## 2020-08-03 NOTE — Progress Notes (Signed)
[facility]  [address] [place],  [place]  [address] [phone]  Clinic Day:  08/10/2020  Referring physician: Olive Bass, MD  This document serves as a record of services personally performed by Aaron Pray, MD. It was created on their behalf by Aaron Cunningham,Aaron Cunningham, a trained medical scribe. The creation of this record is based on the scribe's personal observations and the provider's statements to them.  CHIEF COMPLAINT:  CC: History of multiple recurrences of deep venous thrombosis  Current Treatment:  Lovenox  1 mg/kg BID   HISTORY OF PRESENT ILLNESS:  Aaron Cunningham is a 55 y.o. male with a history of recurrent deep venous thrombosis and episode of pulmonary emboli who has an IVC filter in place.  His 1st episode was in December of 2016 and it has always been the right lower extremity.  He was admitted earlier in January 2022 for recurrent DVT but we are not sure about his compliance with the Arixtra.  He presented with worsening deep venous thrombosis of the entire right lower extremity as well as the left with thrombosis of the inferior neck cava filter, with thrombus extending above the filter to the level of the renal vein.  He was having severe pain and 3 to 4+ edema of the right lower extremity, and was unable to stand or ambulate.  He was admitted for IV heparin and was then switched to Lovenox at 1 milligram/kilogram twice daily rather than 1.5 milligrams/kilogram once daily.  The Doppler confirmed extensive deep venous thrombosis throughout the right lower extremity which is extensive and occlusive.  He has seen a vascular surgeon in the past and has had a stent placed as well as the IVC filter at [facility].  A vascular surgeon was contacted when the patient was in the emergency room and did not have any specific recommendations at this time.  He has seen vascular surgeons in the past.  I ordered oxycodone for the patient's pain  and it does give him some relief.  He continues to smoke and was drinking alcohol heavily previously.  At some point, he should probably have removal of the IVC filter, but I feel it would be too risky at this time. He was discharged on January 29th.  His daughter has undergone genetic testing and does have a MTHFR, homozygous mutation.  I went through his records from Dr. Arlan Cunningham, and did not see that he was checked for hypercoagulable state, but he was negative for JAK2 mutation (polycythemia vera).  Factor IV Leiden was negative in March.  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  However, he was already on anticoagulant therapy at that time.  We will monitor this periodically.  He also has problems with hypertension and did have a COVID infection in February.  INTERVAL HISTORY:  Aaron Cunningham is here for routine follow up.  He continues Lovenox twice a day as prescribed, but has started to develop knots at the injection sites as well as a hematoma.  He asks about switching medications due to Arixtra, but this is largely not available.  He has been complaint with his Lovenox dosing of 1 mg/kg twice daily.  I recommend that we try decreasing his Lovenox dose to 120 mg once daily, I.Cunningham 1.5 mg/kg.  He does report intermittent right lower extremity edema, and pain which he rates as a 4/5 today.  This worsens if he stands on it for a  period of time.  He reports intermittent epistaxis, but has not had an episode in at least 1 month.  He continues to abstain from alcohol.  Blood counts and chemistries are unremarkable including a hemoglobin of 14.1, improved from 12.3.  The MCV has also improved from 104 to 98.  His  appetite is good, and he has gained 6 pounds since his last visit.  He denies fever, chills or other signs of infection.  He denies nausea, vomiting, bowel issues, or abdominal pain.  He denies sore throat, cough, dyspnea, or chest pain.  REVIEW OF  SYSTEMS:  Review of Systems  Constitutional: Negative.  Negative for appetite change, chills, fatigue, fever and unexpected weight change.  HENT:   Positive for nosebleeds (intermittent).   Eyes: Negative.   Respiratory: Negative.  Negative for chest tightness, cough, hemoptysis, shortness of breath and wheezing.   Cardiovascular:  Positive for leg swelling (right lower extremity, which is painful and debilitating). Negative for chest pain and palpitations.  Gastrointestinal: Negative.  Negative for abdominal distention, abdominal pain, blood in stool, constipation, diarrhea, nausea and vomiting.  Endocrine: Negative.   Genitourinary: Negative.  Negative for difficulty urinating, dysuria, frequency and hematuria.   Musculoskeletal: Negative.  Negative for arthralgias, back pain, flank pain, gait problem and myalgias.  Skin: Negative.   Neurological: Negative.  Negative for dizziness, extremity weakness, gait problem, headaches, light-headedness, numbness, seizures and speech difficulty.  Hematological: Negative.   Psychiatric/Behavioral: Negative.  Negative for depression and sleep disturbance. The patient is not nervous/anxious.   All other systems reviewed and are negative.   VITALS:  Blood pressure (!) 170/88, pulse 79, temperature 98 F (36.7 C), temperature source Oral, resp. rate 18, height 5\' 8"  (1.727 m), weight 175 lb (79.4 kg), SpO2 100 %.  Wt Readings from Last 3 Encounters:  08/10/20 175 lb (79.4 kg)  03/12/20 169 lb 12.8 oz (77 kg)  01/28/20 165 lb (74.8 kg)    Body mass index is 26.61 kg/m.  Performance status (ECOG): 1 - Symptomatic but completely ambulatory  PHYSICAL EXAM:  Physical Exam Constitutional:      General: He is not in acute distress.    Appearance: Normal appearance. He is normal weight.  HENT:     Head: Normocephalic and atraumatic.  Eyes:     General: No scleral icterus.    Extraocular Movements: Extraocular movements intact.      Conjunctiva/sclera: Conjunctivae normal.     Pupils: Pupils are equal, round, and reactive to light.  Cardiovascular:     Rate and Rhythm: Normal rate and regular rhythm.     Pulses: Normal pulses.     Heart sounds: Normal heart sounds. No murmur heard.   No friction rub. No gallop.  Pulmonary:     Effort: Pulmonary effort is normal. No respiratory distress.     Breath sounds: Normal breath sounds.  Abdominal:     General: Bowel sounds are normal. There is no distension.     Palpations: Abdomen is soft. There is no hepatomegaly, splenomegaly or mass.     Tenderness: There is no abdominal tenderness.     Comments: He has a hematoma in the left lower quadrant which is 3-4 cm across and tender.  He also has some scattered knots at the injection site.  Musculoskeletal:        General: Normal range of motion.     Cervical back: Normal range of motion and neck supple.     Right lower leg: No edema.  Left lower leg: No edema.     Comments: Right calf is firm and mildly swollen  Lymphadenopathy:     Cervical: No cervical adenopathy.  Skin:    General: Skin is warm and dry.  Neurological:     General: No focal deficit present.     Mental Status: He is alert and oriented to person, place, and time. Mental status is at baseline.  Psychiatric:        Mood and Affect: Mood normal.        Behavior: Behavior normal.        Thought Content: Thought content normal.        Judgment: Judgment normal.    LABS:   CBC Latest Ref Rng & Units 08/10/2020 03/12/2020 01/28/2020  WBC - 5.8 4.9 4.9  Hemoglobin 13.5 - 17.5 14.1 12.3(A) 12.9(L)  Hematocrit 41 - 53 41 36(A) 37.9(L)  Platelets 150 - 399 166 158 129(L)   CMP Latest Ref Rng & Units 03/12/2020 01/28/2020 05/01/2018  Glucose 70 - 99 mg/dL - 502(D) 741(O)  BUN 4 - 21 11 7 8   Creatinine 0.6 - 1.3 0.9 1.05 0.88  Sodium 137 - 147 140 141 139  Potassium 3.4 - 5.3 3.7 3.9 3.1(L)  Chloride 99 - 108 103 104 103  CO2 13 - 22 33(A) 27 28  Calcium  8.7 - 10.7 8.6(A) 8.6(L) 7.9(L)  Total Protein 6.5 - 8.1 g/dL - 5.6(L) 5.0(L)  Total Bilirubin 0.3 - 1.2 mg/dL - 0.6 0.6  Alkaline Phos 25 - 125 40 50 57  AST 14 - 40 15 21 29   ALT 10 - 40 9(A) 16 39   Lab Results  Component Value Date   TIBC 275 01/28/2020   TIBC 185 (L) 05/01/2018   TIBC 289 10/09/2017   FERRITIN 75 01/28/2020   FERRITIN 245 05/01/2018   FERRITIN 182 10/09/2017   IRONPCTSAT 17 (L) 01/28/2020   IRONPCTSAT 23 05/01/2018   IRONPCTSAT 80 10/09/2017   Lab Results  Component Value Date   LDH 188 10/09/2017   LDH 183 08/15/2017   LDH 210 07/04/2017     STUDIES:   No current studies  HISTORY:   Allergies: No Known Allergies  Current Medications: Current Outpatient Medications  Medication Sig Dispense Refill   omeprazole (PRILOSEC) 40 MG capsule Take by mouth.     amLODipine (NORVASC) 5 MG tablet Take 1 tablet (5 mg total) by mouth daily. 30 tablet 0   atorvastatin (LIPITOR) 40 MG tablet SMARTSIG:1 Tablet(s) By Mouth Every Evening     buPROPion (WELLBUTRIN SR) 100 MG 12 hr tablet Take 100 mg by mouth every morning.     enoxaparin (LOVENOX) 80 MG/0.8ML injection SMARTSIG:80 Milligram(s) SUB-Q Every 12 Hours     EPINEPHrine (EPIPEN 2-PAK) 0.3 mg/0.3 mL IJ SOAJ injection 0.3 mg once as needed.     escitalopram (LEXAPRO) 10 MG tablet TAKE ONE TABLET BY MOUTH EVERY EVENING 90 tablet 2   FeFum-FePoly-FA-B Cmp-C-Biot (INTEGRA PLUS) CAPS Take 1 capsule by mouth daily. 90 capsule 2   folic acid (FOLVITE) 1 MG tablet TAKE ONE TABLET BY MOUTH EVERY EVENING 90 tablet 6   ipratropium-albuterol (DUONEB) 0.5-2.5 (3) MG/3ML SOLN INHALE THE CONTENT OF 1 VIAL Q 4 H PRN WHEEZING     lisinopril (ZESTRIL) 40 MG tablet Take 1 tablet (40 mg total) by mouth daily. 30 tablet 1   magnesium oxide (MAG-OX) 400 MG tablet Take by mouth.     metoprolol tartrate (LOPRESSOR) 25 MG tablet  Take 25 mg by mouth 2 (two) times daily. Patient to taper off medication- once a day then stop  completely     mirtazapine (REMERON) 45 MG tablet SMARTSIG:1 Tablet(s) By Mouth Every Evening     montelukast (SINGULAIR) 10 MG tablet SMARTSIG:1 Tablet(s) By Mouth Every Evening     potassium chloride (KLOR-CON) 20 MEQ packet Take 20 mEq by mouth.     PROAIR HFA 108 (90 Base) MCG/ACT inhaler inhale TWO PUFF EVERY 4 TO 6 HOURS AS NEEDED FOR coughing/ wheezing/ SHORTNESS OF BREATH  0   tamsulosin (FLOMAX) 0.4 MG CAPS capsule TAKE ONE CAPSULE BY MOUTH EVERY EVENING 30 capsule 2   thiamine (VITAMIN B-1) 100 MG tablet Take 100 mg by mouth daily.     traMADol (ULTRAM) 50 MG tablet Take 1 tablet (50 mg total) by mouth every 6 (six) hours as needed. 90 tablet 0   No current facility-administered medications for this visit.     ASSESSMENT & PLAN:   Assessment:   1.  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  He has been seen by Dr. Arlan Cunningham in the past, and was negative for polycythemia vera.  I have agreed to be his local hematologist since he resides in [place].   He continues Lovenox at 80 mg (1 mg/kg) subcu twice daily currently.    2.  Episode of pulmonary emboli.  3. Extensive thrombosis of the right lower extremity throughout the leg and occlusive thrombus in the left common femoral vein as well.  This has been debilitating and painful.  He was given Tramadol without much relief, and so has been using Tylenol.  I do consider him disabled based on these recurrent episodes of thrombosis and his postphlebitic syndrome.  He has clot in the inferior vena cava and surrounding his IVC filter so is at extremely high risk for complications.  4. Tobacco abuse.  He has smoked 1 pack per day for at least 30 years.  5. Alcohol abuse.  He has stopped drinking alcohol since January 2022.   6. Anemia, mild, resolved.  Evaluation did reveal B12 deficiency.  He received 1 injection in the hospital and is now on oral supplement 100 mg daily.  His  macrocytosis has improved, probably due to the vitamin B12 supplement as well as abstinence from alcohol.  He is at risk of developing polycythemia again, so we will continue to monitor this, but he is just barely normal now.    Plan: He knows to continue Lovenox at 80 mg subcu twice daily for now, but I will decrease the dose to 120 mg (1.5 mg/kg) once daily with his next refill.  We will see him back in 6 months with CBC, CMP, B12 and anticardiolipin antibodies for repeat evaluation.  He understands and agrees with this plan of care.  I have answered his questions and he knows to call with any concerns.   I provided 15 minutes of face-to-face time during this this encounter and > 50% was spent counseling as documented under my assessment and plan.    Dellia Beckwith, MD [facility] AT [facility] [address] [place] [address] Dept: [phone] Dept Fax: [phone]   I, Foye Deer, am acting as scribe for Dellia Beckwith, MD  I have reviewed this report as typed by the medical scribe, and it is complete and accurate.  Nicoletta Ba

## 2020-08-10 ENCOUNTER — Inpatient Hospital Stay (INDEPENDENT_AMBULATORY_CARE_PROVIDER_SITE_OTHER): Payer: BC Managed Care – PPO | Admitting: Oncology

## 2020-08-10 ENCOUNTER — Other Ambulatory Visit: Payer: Self-pay

## 2020-08-10 ENCOUNTER — Encounter: Payer: Self-pay | Admitting: Oncology

## 2020-08-10 ENCOUNTER — Inpatient Hospital Stay: Payer: BC Managed Care – PPO | Attending: Oncology

## 2020-08-10 ENCOUNTER — Other Ambulatory Visit: Payer: Self-pay | Admitting: Oncology

## 2020-08-10 ENCOUNTER — Telehealth: Payer: Self-pay

## 2020-08-10 ENCOUNTER — Telehealth: Payer: Self-pay | Admitting: Oncology

## 2020-08-10 VITALS — BP 170/88 | HR 79 | Temp 98.0°F | Resp 18 | Ht 68.0 in | Wt 175.0 lb

## 2020-08-10 DIAGNOSIS — Z86711 Personal history of pulmonary embolism: Secondary | ICD-10-CM | POA: Insufficient documentation

## 2020-08-10 DIAGNOSIS — F17210 Nicotine dependence, cigarettes, uncomplicated: Secondary | ICD-10-CM | POA: Diagnosis not present

## 2020-08-10 DIAGNOSIS — E538 Deficiency of other specified B group vitamins: Secondary | ICD-10-CM | POA: Insufficient documentation

## 2020-08-10 DIAGNOSIS — I82401 Acute embolism and thrombosis of unspecified deep veins of right lower extremity: Secondary | ICD-10-CM

## 2020-08-10 DIAGNOSIS — D7589 Other specified diseases of blood and blood-forming organs: Secondary | ICD-10-CM | POA: Insufficient documentation

## 2020-08-10 DIAGNOSIS — Z86718 Personal history of other venous thrombosis and embolism: Secondary | ICD-10-CM | POA: Diagnosis present

## 2020-08-10 DIAGNOSIS — Z7901 Long term (current) use of anticoagulants: Secondary | ICD-10-CM | POA: Insufficient documentation

## 2020-08-10 DIAGNOSIS — D510 Vitamin B12 deficiency anemia due to intrinsic factor deficiency: Secondary | ICD-10-CM

## 2020-08-10 DIAGNOSIS — Z79899 Other long term (current) drug therapy: Secondary | ICD-10-CM | POA: Diagnosis not present

## 2020-08-10 LAB — HEPATIC FUNCTION PANEL
ALT: 23 (ref 10–40)
AST: 23 (ref 14–40)
Alkaline Phosphatase: 50 (ref 25–125)
Bilirubin, Total: 0.5

## 2020-08-10 LAB — CBC AND DIFFERENTIAL
HCT: 41 (ref 41–53)
Hemoglobin: 14.1 (ref 13.5–17.5)
Neutrophils Absolute: 2.84
Platelets: 166 (ref 150–399)
WBC: 5.8

## 2020-08-10 LAB — BASIC METABOLIC PANEL
BUN: 17 (ref 4–21)
CO2: 26 — AB (ref 13–22)
Chloride: 107 (ref 99–108)
Creatinine: 1.2 (ref 0.6–1.3)
Glucose: 115
Potassium: 4.7 (ref 3.4–5.3)
Sodium: 143 (ref 137–147)

## 2020-08-10 LAB — VITAMIN B12: Vitamin B-12: 1027 pg/mL — ABNORMAL HIGH (ref 180–914)

## 2020-08-10 LAB — CBC: RBC: 4.16 (ref 3.87–5.11)

## 2020-08-10 LAB — COMPREHENSIVE METABOLIC PANEL
Albumin: 4.4 (ref 3.5–5.0)
Calcium: 9.3 (ref 8.7–10.7)

## 2020-08-10 MED ORDER — ENOXAPARIN SODIUM 120 MG/0.8ML IJ SOSY: 120.0000 mg | PREFILLED_SYRINGE | INTRAMUSCULAR | 5 refills | Status: DC

## 2020-08-10 NOTE — Telephone Encounter (Signed)
Per 7/25 LOS, patient scheduled for Jan 2023 Appt's.  Gave patient Appt Summary

## 2020-08-10 NOTE — Telephone Encounter (Signed)
Patient notified

## 2020-08-10 NOTE — Telephone Encounter (Signed)
-----   Message from Dellia Beckwith, MD sent at 08/10/2020  2:47 PM EDT ----- Regarding: call pt Tell him B12 looks great, in fact he can stop the B12 pills

## 2020-08-13 ENCOUNTER — Encounter: Payer: Self-pay | Admitting: Family

## 2020-08-16 ENCOUNTER — Other Ambulatory Visit: Payer: Self-pay | Admitting: Oncology

## 2020-08-16 DIAGNOSIS — I824Z2 Acute embolism and thrombosis of unspecified deep veins of left distal lower extremity: Secondary | ICD-10-CM

## 2020-09-02 ENCOUNTER — Telehealth: Payer: Self-pay

## 2020-09-02 NOTE — Telephone Encounter (Signed)
Pt came into clinic to clarify his Lovenox dosage. Pt brought his Lovenox injections with him. The injections he had were Lovenox 120mg /0.33mL. The 120mg  injection qd is the correct dosage as of 08/10/20. Pt was previously on 80mg /0.86mL BID - the 0.41mL was what was confusing to him. I explained that the 0.79mL is the same amount of fluid, but that the strength of medication was different. Pt verbalized understanding.  Pt also asked me to take a look at a vein on his right hip. Pt states that the vein has popped up in the last couple of days. The vein he is pointing to looks similar to a varicose vein that you would see in a leg. The vein is not large, knobby, or prominent in appearance. I reported the area to [facility], who recommends that pt continue to watch it. She doesn't feel it is related to the Lovenox injections.

## 2020-09-08 ENCOUNTER — Telehealth: Payer: Self-pay

## 2020-09-08 NOTE — Telephone Encounter (Addendum)
Pt notified of below. He wishes to stay on the daily lovenox dosage. He will keep an eye on the leg for changes and notify us as needed.   FW: Right leg aching.  Recent reduction in lovenox dosage Received: Today Mosher, Morrie Sheldon, RN Phone Number: [phone]   Please let him know. Thanks!        Previous Messages   ----- Message -----  From: Dellia Beckwith, MD  Sent: 09/09/2020   7:15 AM EDT  To: Adah Perl, PA-C  Subject: RE: Right leg aching.  Recent reduction in l*   I don't think so.  If he develops other signs, we can do doppler.  If he wants to go back to bid, we can  ----- Message -----  From: Adah Perl, PA-C  Sent: 09/08/2020   3:36 PM EDT  To: Dellia Beckwith, MD  Subject: FW: Right leg aching.  Recent reduction in l*   Without swelling or other s/s, I didn't think so. What is your opinion?      Pt notified of Kelli,PA, reponse, below & tht Dr Gilman Buttner will be back in clinic tomorrow.    RE: Right leg aching.  Recent reduction in lovenox dosage Received: Today Mosher, Morrie Sheldon, RN Phone Number: [phone]   I don't think this is the cause. I have forwarded to Dr. Shyrl Numbers, since I haven't seen him.     09/08/20 @ 1321- Pt called to report that his right leg, below his knee is aching. It is not swollen. It is not red. He has not had an injury. Pt describes as, "I feel like my leg needs to be stretched". Pt has been on Lovenox 80mg  BID for long while. Dr just reduced to Lovenox 120mg  this last fill. Pt asking if the change could cause this?

## 2020-09-09 ENCOUNTER — Encounter: Payer: Self-pay | Admitting: Family

## 2020-09-11 ENCOUNTER — Telehealth: Payer: Self-pay

## 2020-09-11 NOTE — Telephone Encounter (Addendum)
Pt notified of Kelli's, PA, response. He verbalized understanding.  ----- Message from Adah Perl, PA-C sent at 09/11/2020  3:20 PM EDT ----- Regarding: RE: Dropped handle of vacum on his leg Contact: [phone] He is on blood thinner to prevent clotting. Goose egg is bleeding under the skin. I will not be surprised if that area turns purple. Keep [place] posted. Thanks ----- Message ----- From: Hipolito Bayley, RN Sent: 09/11/2020   3:07 PM EDT To: Adah Perl, PA-C Subject: Dropped handle of vacum on his leg             Pt called to report that he accidentally dropped the handle end of a vacuum on his foot. He immediately had a "goose egg, but no bruising. My knee down is still pretty swollen. I just want to call because I don't want another blood clot".

## 2020-09-15 ENCOUNTER — Other Ambulatory Visit: Payer: Self-pay | Admitting: Hematology & Oncology

## 2020-09-15 DIAGNOSIS — I2692 Saddle embolus of pulmonary artery without acute cor pulmonale: Secondary | ICD-10-CM

## 2020-09-15 DIAGNOSIS — I82413 Acute embolism and thrombosis of femoral vein, bilateral: Secondary | ICD-10-CM

## 2020-09-15 DIAGNOSIS — I824Z2 Acute embolism and thrombosis of unspecified deep veins of left distal lower extremity: Secondary | ICD-10-CM

## 2020-09-25 ENCOUNTER — Inpatient Hospital Stay: Payer: BC Managed Care – PPO | Attending: Oncology

## 2020-09-25 ENCOUNTER — Other Ambulatory Visit: Payer: Self-pay

## 2020-09-25 ENCOUNTER — Encounter: Payer: Self-pay | Admitting: Hematology and Oncology

## 2020-09-25 ENCOUNTER — Telehealth: Payer: Self-pay | Admitting: Hematology and Oncology

## 2020-09-25 ENCOUNTER — Other Ambulatory Visit: Payer: Self-pay | Admitting: Hematology and Oncology

## 2020-09-25 ENCOUNTER — Ambulatory Visit (INDEPENDENT_AMBULATORY_CARE_PROVIDER_SITE_OTHER): Payer: BC Managed Care – PPO | Admitting: Hematology and Oncology

## 2020-09-25 ENCOUNTER — Telehealth: Payer: Self-pay

## 2020-09-25 DIAGNOSIS — I824Z2 Acute embolism and thrombosis of unspecified deep veins of left distal lower extremity: Secondary | ICD-10-CM

## 2020-09-25 DIAGNOSIS — I82401 Acute embolism and thrombosis of unspecified deep veins of right lower extremity: Secondary | ICD-10-CM

## 2020-09-25 LAB — HEPATIC FUNCTION PANEL
ALT: 22 (ref 10–40)
AST: 38 (ref 14–40)
Alkaline Phosphatase: 62 (ref 25–125)
Bilirubin, Total: 0.6

## 2020-09-25 LAB — CBC AND DIFFERENTIAL
HCT: 41 (ref 41–53)
Hemoglobin: 13.7 (ref 13.5–17.5)
Neutrophils Absolute: 4.25
Platelets: 156 (ref 150–399)
WBC: 5.9

## 2020-09-25 LAB — BASIC METABOLIC PANEL
BUN: 12 (ref 4–21)
CO2: 32 — AB (ref 13–22)
Chloride: 105 (ref 99–108)
Creatinine: 1.1 (ref 0.6–1.3)
Glucose: 99
Potassium: 3.9 (ref 3.4–5.3)
Sodium: 142 (ref 137–147)

## 2020-09-25 LAB — COMPREHENSIVE METABOLIC PANEL
Albumin: 4.4 (ref 3.5–5.0)
Calcium: 9.2 (ref 8.7–10.7)

## 2020-09-25 LAB — CBC: RBC: 4.15 (ref 3.87–5.11)

## 2020-09-25 NOTE — Telephone Encounter (Signed)
09/25/20 spoke with patient and scheduled stat venous doppler and appt with Ilda Basset.

## 2020-09-25 NOTE — Assessment & Plan Note (Signed)
Venous doppler study of the right lower extremity was negative for acute DVT. We discussed continuing his lovenox injections daily. He will return to clinic in 2 weeks for repeat evaluation.

## 2020-09-25 NOTE — Progress Notes (Addendum)
[facility]  [address] [place],  [place]  [address] [phone]  Clinic Day:  09/25/2020  Referring physician: Olive Bass, MD  ASSESSMENT & PLAN:   Assessment & Plan: DVT, lower extremity (HCC) Venous doppler study of the right lower extremity was negative for acute DVT. We discussed continuing his lovenox injections daily. He will return to clinic in 2 weeks for repeat evaluation.   The patient understands the plans discussed today and is in agreement with them.  He knows to contact our office if he develops concerns prior to his next appointment.    Pascal Lux, NP  [facility] AT [facility] [address] [place] [address] Dept: [phone] Dept Fax: [phone]   Orders Placed This Encounter  Procedures   CBC and differential    This external order was created through the Results Console.   CBC    This external order was created through the Results Console.   Basic metabolic panel    This external order was created through the Results Console.   Comprehensive metabolic panel    This external order was created through the Results Console.   Hepatic function panel    This external order was created through the Results Console.       CHIEF COMPLAINT:  CC: A 55 year old male with history of DVT, lower extremity here for evaluation of new right lower leg pain.  Current Treatment:  Lovenox daily   HISTORY OF PRESENT ILLNESS:   Oncology History   No history exists.   1.  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  He has been seen by Dr. Arlan Organ in the past, and was negative for polycythemia vera. He continues Lovenox at 120 mg (1 mg/kg) subcu daily currently.     2.  Episode of pulmonary emboli.   3. Extensive thrombosis of the right lower extremity throughout the leg and occlusive thrombus in the left  common femoral vein as well.  This has been debilitating and painful.  He was given Tramadol without much relief, and so has been using Tylenol.  I do consider him disabled based on these recurrent episodes of thrombosis and his postphlebitic syndrome.  He has clot in the inferior vena cava and surrounding his IVC filter so is at extremely high risk for complications.   4. Tobacco abuse.  He has smoked 1 pack per day for at least 30 years.   5. Alcohol abuse.  He has stopped drinking alcohol since January 2022.    6. Anemia, mild, resolved.  Evaluation did reveal B12 deficiency.  He received 1 injection in the hospital and is now on oral supplement 100 mg daily.  His macrocytosis has improved, probably due to the vitamin B12 supplement as well as abstinence from alcohol.  He is at risk of developing polycythemia again, so we will continue to monitor this, but he is just barely normal now.      INTERVAL HISTORY:  Delma is here today for evaluation of new right lower extremity pain that has increased over the last couple of days. He reports dropping a vacuum cleaner partially on his right foot where a "goose egg" immediately developed. That swelling has since gone down, but he has more pain in his right calf. He continues Lovenox daily for history of DVT. He denies fever, chills, nausea or vomiting. He denies shortness of breath, chest  pain, or cough. He denies issue with bowel or bladder.   REVIEW OF SYSTEMS:  Review of Systems  Constitutional:  Negative for appetite change, chills, diaphoresis, fatigue, fever and unexpected weight change.  HENT:   Negative for hearing loss, lump/mass, mouth sores, nosebleeds, sore throat, tinnitus, trouble swallowing and voice change.   Eyes:  Negative for eye problems and icterus.  Respiratory:  Negative for chest tightness, cough, hemoptysis, shortness of breath and wheezing.   Cardiovascular:  Negative for chest pain, leg swelling and palpitations.   Gastrointestinal:  Negative for abdominal distention, abdominal pain, blood in stool, constipation, diarrhea, nausea, rectal pain and vomiting.  Endocrine: Negative for hot flashes.  Genitourinary:  Negative for bladder incontinence, difficulty urinating, dyspareunia, dysuria, frequency, hematuria and nocturia.   Musculoskeletal:  Negative for arthralgias, back pain, flank pain, gait problem, myalgias, neck pain and neck stiffness.       Right calf pain  Skin:  Negative for itching, rash and wound.  Neurological:  Negative for dizziness, extremity weakness, gait problem, headaches, light-headedness, numbness, seizures and speech difficulty.  Hematological:  Negative for adenopathy. Does not bruise/bleed easily.  Psychiatric/Behavioral:  Negative for confusion, decreased concentration, depression, sleep disturbance and suicidal ideas. The patient is not nervous/anxious.     VITALS:  Blood pressure (!) 154/83, pulse 68, temperature 99 F (37.2 C), temperature source Oral, resp. rate 18, height 5\' 9"  (1.753 m), weight 175 lb 4 oz (79.5 kg), SpO2 97 %.  Wt Readings from Last 3 Encounters:  09/25/20 175 lb 4 oz (79.5 kg)  08/10/20 175 lb (79.4 kg)  03/12/20 169 lb 12.8 oz (77 kg)    Body mass index is 25.88 kg/m.  Performance status (ECOG): 1 - Symptomatic but completely ambulatory  PHYSICAL EXAM:  Physical Exam Constitutional:      General: He is not in acute distress.    Appearance: Normal appearance. He is normal weight. He is not ill-appearing, toxic-appearing or diaphoretic.  HENT:     Head: Normocephalic and atraumatic.     Nose: Nose normal. No congestion or rhinorrhea.     Mouth/Throat:     Mouth: Mucous membranes are moist.     Pharynx: Oropharynx is clear. No oropharyngeal exudate or posterior oropharyngeal erythema.  Eyes:     General: No scleral icterus.       Right eye: No discharge.        Left eye: No discharge.     Extraocular Movements: Extraocular movements  intact.     Conjunctiva/sclera: Conjunctivae normal.     Pupils: Pupils are equal, round, and reactive to light.  Neck:     Vascular: No carotid bruit.  Cardiovascular:     Rate and Rhythm: Normal rate and regular rhythm.     Heart sounds: No murmur heard.   No friction rub. No gallop.  Pulmonary:     Effort: Pulmonary effort is normal. No respiratory distress.     Breath sounds: Normal breath sounds. No stridor. No wheezing, rhonchi or rales.  Chest:     Chest wall: No tenderness.  Abdominal:     General: Abdomen is flat. Bowel sounds are normal. There is no distension.     Palpations: There is no mass.     Tenderness: There is no abdominal tenderness. There is no right CVA tenderness, left CVA tenderness, guarding or rebound.     Hernia: No hernia is present.  Musculoskeletal:        General: No swelling, tenderness, deformity or  signs of injury. Normal range of motion.     Cervical back: Normal range of motion and neck supple. No rigidity or tenderness.     Right lower leg: No edema.     Left lower leg: No edema.  Lymphadenopathy:     Cervical: No cervical adenopathy.  Skin:    General: Skin is warm and dry.     Capillary Refill: Capillary refill takes less than 2 seconds.     Coloration: Skin is not jaundiced or pale.     Findings: No bruising, erythema, lesion or rash.  Neurological:     General: No focal deficit present.     Mental Status: He is alert and oriented to person, place, and time. Mental status is at baseline.     Cranial Nerves: No cranial nerve deficit.     Sensory: No sensory deficit.     Motor: No weakness.     Coordination: Coordination normal.     Gait: Gait normal.     Deep Tendon Reflexes: Reflexes normal.  Psychiatric:        Mood and Affect: Mood normal.        Behavior: Behavior normal.        Thought Content: Thought content normal.        Judgment: Judgment normal.    LABS:   CBC Latest Ref Rng & Units 09/25/2020 08/10/2020 03/12/2020  WBC -  5.9 5.8 4.9  Hemoglobin 13.5 - 17.5 13.7 14.1 12.3(A)  Hematocrit 41 - 53 41 41 36(A)  Platelets 150 - 399 156 166 158   CMP Latest Ref Rng & Units 09/25/2020 08/10/2020 03/12/2020  Glucose 70 - 99 mg/dL - - -  BUN 4 - 21 12 17 11   Creatinine 0.6 - 1.3 1.1 1.2 0.9  Sodium 137 - 147 142 143 140  Potassium 3.4 - 5.3 3.9 4.7 3.7  Chloride 99 - 108 105 107 103  CO2 13 - 22 32(A) 26(A) 33(A)  Calcium 8.7 - 10.7 9.2 9.3 8.6(A)  Total Protein 6.5 - 8.1 g/dL - - -  Total Bilirubin 0.3 - 1.2 mg/dL - - -  Alkaline Phos 25 - 125 62 50 40  AST 14 - 40 38 23 15  ALT 10 - 40 22 23 9(A)     No results found for: CEA1 / No results found for: CEA1 No results found for: PSA1 No results found for: No results found for: CAN125  No results found for: OIZ124, A1GS, A2GS, Dorene Ar, MSPIKE, SPEI Lab Results  Component Value Date   TIBC 275 01/28/2020   TIBC 185 (L) 05/01/2018   TIBC 289 10/09/2017   FERRITIN 75 01/28/2020   FERRITIN 245 05/01/2018   FERRITIN 182 10/09/2017   IRONPCTSAT 17 (L) 01/28/2020   IRONPCTSAT 23 05/01/2018   IRONPCTSAT 80 10/09/2017   Lab Results  Component Value Date   LDH 188 10/09/2017   LDH 183 08/15/2017   LDH 210 07/04/2017    STUDIES:  Exam(s): 07/06/2017 US/US EXTREM LOW VENOUS - R CLINICAL DATA:  Right knee and calf pain.  History of DVT.  EXAM: RIGHT LOWER EXTREMITY VENOUS DOPPLER ULTRASOUND  TECHNIQUE: Gray-scale sonography with graded compression, as well as color Doppler and duplex ultrasound were performed to evaluate the lower extremity deep venous systems from the level of the common femoral vein and including the common femoral, femoral, profunda femoral, popliteal and calf veins including the posterior tibial, peroneal and gastrocnemius veins when visible. The  superficial great saphenous vein was also interrogated. Spectral Doppler was utilized to evaluate flow at rest and with distal augmentation maneuvers  in the common femoral, femoral and popliteal veins.  COMPARISON:  04/21/2020  FINDINGS: Contralateral Common Femoral Vein: Respiratory phasicity is normal and symmetric with the symptomatic side. No evidence of thrombus. Normal compressibility.  Common Femoral Vein: No evidence of thrombus. Normal compressibility and color Doppler flow.  Saphenofemoral Junction: No evidence of thrombus. Normal compressibility and flow on color Doppler imaging.  Profunda Femoral Vein: No evidence of thrombus. Normal compressibility and flow on color Doppler imaging.  Femoral Vein: No evidence of thrombus. Normal compressibility and color Doppler flow.  Popliteal Vein: Positive for thrombus. Again noted is echogenic thrombus in the distal right popliteal vein. Distal popliteal vein thrombus is occlusive. Proximal popliteal vein is patent.  Calf Veins: No evidence of thrombus. Normal compressibility and flow on color Doppler imaging.  Other Findings: Normal compressibility in the right short saphenous vein.  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 No evidence for acute DVT in the right lower extremity. Findings in the right popliteal vein are similar to the exam on 04/21/2020.   Electronically Signed   By: Richarda OverlieAdam  Henn M.D.   On: 09/25/2020 12:22  Electronically Signed By: Arn MedalHenn,Adam R MD  Electronically Signed Date/Time: 09/09/221225 Dictate Date/Time: 09/25/20 1215  Technologist: Stark KleinHicks,Angela Transcribed By: Weyman CroonVRTran Transcribed Date/Time: 09/25/20 1222    HISTORY:   Past Medical History:  Diagnosis Date   Anxiety    Bronchiolitis Feb. 2016   DVT (deep venous thrombosis) (HCC)    GERD (gastroesophageal reflux disease)    Hyperlipidemia    Peripheral vascular disease (HCC)    Pneumonia Feb. 2016   PVD (peripheral vascular disease) (HCC) 01/09/2015   Right iliac stent 2007    Past Surgical History:  Procedure Laterality Date    CHOLECYSTECTOMY  10/2015   HAND SURGERY     IVC FILTER PLACEMENT ([facility] HX)  2016    ROTATOR CUFF REPAIR     THROMBOLYSIS Right Nov. 9, 2010   Lower Extrim.   TONSILLECTOMY      Family History  Problem Relation Age of Onset   Deep vein thrombosis Father    Heart disease Father [id number]       Before age 55   Hyperlipidemia Father    Heart attack Father    Stroke Father     Social History:  reports that he has been smoking cigarettes. He has a 30.00 pack-year smoking history. He has never used smokeless tobacco. He reports that he does not currently use alcohol. He reports that he does not use drugs.The patient is alone  today.  Allergies: No Known Allergies  Current Medications: Current Outpatient Medications  Medication Sig Dispense Refill   amLODipine (NORVASC) 5 MG tablet Take 1 tablet (5 mg total) by mouth daily. 30 tablet 0   atorvastatin (LIPITOR) 40 MG tablet SMARTSIG:1 Tablet(s) By Mouth Every Evening     buPROPion (WELLBUTRIN SR) 100 MG 12 hr tablet Take 100 mg by mouth every morning.     enoxaparin (LOVENOX) 120 MG/0.8ML injection Inject 0.8 mLs (120 mg total) into the skin daily. 30 mL 5   EPINEPHrine (EPIPEN 2-PAK) 0.3 mg/0.3 mL IJ SOAJ injection 0.3 mg once as needed.     escitalopram (LEXAPRO) 10 MG tablet TAKE ONE TABLET BY MOUTH EVERY EVENING 90 tablet 2   FeFum-FePoly-FA-B Cmp-C-Biot (INTEGRA PLUS) CAPS Take 1 capsule by mouth daily. 90  capsule 2   folic acid (FOLVITE) 1 MG tablet TAKE ONE TABLET BY MOUTH EVERY EVENING 90 tablet 6   ipratropium-albuterol (DUONEB) 0.5-2.5 (3) MG/3ML SOLN INHALE THE CONTENT OF 1 VIAL Q 4 H PRN WHEEZING     lisinopril (ZESTRIL) 40 MG tablet Take 1 tablet (40 mg total) by mouth daily. 30 tablet 1   magnesium oxide (MAG-OX) 400 MG tablet Take by mouth.     metoprolol tartrate (LOPRESSOR) 25 MG tablet Take 25 mg by mouth 2 (two) times daily. Patient to taper off medication- once a day then stop completely     mirtazapine (REMERON) 45 MG  tablet SMARTSIG:1 Tablet(s) By Mouth Every Evening     montelukast (SINGULAIR) 10 MG tablet SMARTSIG:1 Tablet(s) By Mouth Every Evening     omeprazole (PRILOSEC) 40 MG capsule Take by mouth.     potassium chloride (KLOR-CON) 20 MEQ packet Take 20 mEq by mouth.     PROAIR HFA 108 (90 Base) MCG/ACT inhaler inhale TWO PUFF EVERY 4 TO 6 HOURS AS NEEDED FOR coughing/ wheezing/ SHORTNESS OF BREATH  0   tamsulosin (FLOMAX) 0.4 MG CAPS capsule TAKE ONE CAPSULE BY MOUTH EVERY EVENING 30 capsule 2   thiamine (VITAMIN B-1) 100 MG tablet Take 100 mg by mouth daily.     traMADol (ULTRAM) 50 MG tablet Take 1 tablet (50 mg total) by mouth every 6 (six) hours as needed. 90 tablet 0   No current facility-administered medications for this visit.

## 2020-09-25 NOTE — Telephone Encounter (Addendum)
Per Thomasena Edis- Spoke with patient and he is aware of all appts and to come to [facility]. at 1pm.  ----- Message from Pascal Lux, NP sent at 09/25/2020  9:44 AM EDT ----- Regarding: RE: "severe muscle calf cramps in right leg" We can get him in for ultrasound. I'll put the order in and let scheduling/ authorization know.  ----- Message ----- From: Hipolito Bayley, RN Sent: 09/25/2020   9:34 AM EDT To: Pascal Lux, NP Subject: "severe muscle calf cramps in right leg"       Pt reports that the muscle/calf pain has been bad the last 2 nights. He has hx DVT in the right leg in past & is on blood thinner. He is anxious and just wants to know if it is normal for that to happen? Please see call from 8/26 - between [facility] & I.  Please advise.  [phone]

## 2020-10-12 ENCOUNTER — Inpatient Hospital Stay (INDEPENDENT_AMBULATORY_CARE_PROVIDER_SITE_OTHER): Payer: BC Managed Care – PPO | Admitting: Hematology and Oncology

## 2020-10-12 ENCOUNTER — Other Ambulatory Visit: Payer: Self-pay

## 2020-10-12 ENCOUNTER — Encounter: Payer: Self-pay | Admitting: Hematology and Oncology

## 2020-10-12 ENCOUNTER — Telehealth: Payer: Self-pay | Admitting: Hematology and Oncology

## 2020-10-12 DIAGNOSIS — I82401 Acute embolism and thrombosis of unspecified deep veins of right lower extremity: Secondary | ICD-10-CM

## 2020-10-12 NOTE — Assessment & Plan Note (Signed)
No further swelling and only occasional cramping noted to right leg. Recent imaging was negative for DVT. He continues on Lovenox, but is requesting to switch to an oral medication if possible as he complains of pain across his abdomen related to injections. I will review this with Dr. Gilman Buttner and let him know. He is scheduled for eye surgeries in November. He will keep his January follow up unless there is a change in therapy and then we may see him sooner.

## 2020-10-12 NOTE — Telephone Encounter (Signed)
10/12/20 Next appt scheduled and given to patient

## 2020-10-12 NOTE — Progress Notes (Signed)
[facility]  [address] [place],  [place]  [address] [phone]  Clinic Day:  10/12/2020  Referring physician: Olive Bass, MD  ASSESSMENT & PLAN:   Assessment & Plan: DVT, lower extremity (HCC) No further swelling and only occasional cramping noted to right leg. Recent imaging was negative for DVT. He continues on Lovenox, but is requesting to switch to an oral medication if possible as he complains of pain across his abdomen related to injections. I will review this with Dr. Gilman Buttner and let him know. He is scheduled for eye surgeries in November. He will keep his January follow up unless there is a change in therapy and then we may see him sooner.     The patient understands the plans discussed today and is in agreement with them.  He knows to contact our office if he develops concerns prior to his next appointment.    Pascal Lux, NP  [facility] [address] [place] [address] Dept: [phone] Dept Fax: [phone]   No orders of the defined types were placed in this encounter.     CHIEF COMPLAINT:  CC: A 55 year old male with history of DVT here for 2 week evaluation.  Current Treatment:  Lovenox   HISTORY OF PRESENT ILLNESS:   Oncology History   No history exists.     INTERVAL HISTORY:  Aaron Cunningham is here today for repeat clinical assessment. He had increased pain and swelling to his right leg two weeks ago. Ultrasound at that time was negative. He remains on daily Lovenox and has had no new swelling and only occasional cramping to his right leg. He does complain of pain across his abdomen related to Lovenox injections and states he is running out of areas to administer his injections due to the pain and "knots". He is requesting to switch to an oral agent if possible. Otherwise, he has been well since his last visit. He denies fever, chills, nausea  or vomiting. He denies issue with bowel or bladder. He denies shortness of breath, chest pain or cough.   REVIEW OF SYSTEMS:  Review of Systems  Constitutional:  Negative for appetite change, chills, diaphoresis, fatigue, fever and unexpected weight change.  HENT:   Negative for hearing loss, lump/mass, mouth sores, nosebleeds, sore throat, tinnitus, trouble swallowing and voice change.   Eyes:  Negative for eye problems and icterus.  Respiratory:  Negative for chest tightness, cough, hemoptysis, shortness of breath and wheezing.   Cardiovascular:  Negative for chest pain, leg swelling and palpitations.  Gastrointestinal:  Negative for abdominal distention, abdominal pain, blood in stool, constipation, diarrhea, nausea, rectal pain and vomiting.  Endocrine: Negative for hot flashes.  Genitourinary:  Negative for bladder incontinence, difficulty urinating, dyspareunia, dysuria, frequency, hematuria and nocturia.   Musculoskeletal:  Negative for arthralgias, back pain, flank pain, gait problem, myalgias, neck pain and neck stiffness.  Skin:  Negative for itching, rash and wound.       Pain at injections sites across lower abdomen with multiple "knots" related to Lovenox injections.  Neurological:  Negative for dizziness, extremity weakness, gait problem, headaches, light-headedness, numbness, seizures and speech difficulty.  Hematological:  Negative for adenopathy. Does not bruise/bleed easily.  Psychiatric/Behavioral:  Negative for confusion, decreased concentration, depression, sleep disturbance and suicidal ideas. The patient is not nervous/anxious.     VITALS:  Blood pressure 129/82, pulse 79, temperature 99.1 F (37.3 C), temperature  source Oral, resp. rate 18, height 5\' 9"  (1.753 m), weight 178 lb (80.7 kg), SpO2 95 %.  Wt Readings from Last 3 Encounters:  10/12/20 178 lb (80.7 kg)  09/25/20 175 lb 4 oz (79.5 kg)  08/10/20 175 lb (79.4 kg)    Body mass index is 26.29  kg/m.  Performance status (ECOG): 1 - Symptomatic but completely ambulatory  PHYSICAL EXAM:  Physical Exam Constitutional:      General: He is not in acute distress.    Appearance: Normal appearance. He is normal weight. He is not ill-appearing, toxic-appearing or diaphoretic.  HENT:     Head: Normocephalic and atraumatic.     Right Ear: Tympanic membrane normal.     Left Ear: Tympanic membrane normal.     Nose: Nose normal. No congestion or rhinorrhea.     Mouth/Throat:     Mouth: Mucous membranes are moist.     Pharynx: Oropharynx is clear. No oropharyngeal exudate or posterior oropharyngeal erythema.  Eyes:     General: No scleral icterus.       Right eye: No discharge.        Left eye: No discharge.     Extraocular Movements: Extraocular movements intact.     Conjunctiva/sclera: Conjunctivae normal.     Pupils: Pupils are equal, round, and reactive to light.  Neck:     Vascular: No carotid bruit.  Cardiovascular:     Rate and Rhythm: Normal rate and regular rhythm.     Heart sounds: No murmur heard.   No friction rub. No gallop.  Pulmonary:     Effort: Pulmonary effort is normal. No respiratory distress.     Breath sounds: Normal breath sounds. No stridor. No wheezing, rhonchi or rales.  Chest:     Chest wall: No tenderness.  Abdominal:     General: Abdomen is flat. Bowel sounds are normal. There is no distension.     Palpations: Abdomen is soft. There is no mass.     Tenderness: There is no abdominal tenderness. There is no right CVA tenderness, left CVA tenderness, guarding or rebound.     Hernia: No hernia is present.  Musculoskeletal:        General: No swelling, tenderness, deformity or signs of injury. Normal range of motion.     Cervical back: Normal range of motion and neck supple. No rigidity or tenderness.     Right lower leg: No edema.     Left lower leg: No edema.  Lymphadenopathy:     Cervical: No cervical adenopathy.  Skin:    General: Skin is warm  and dry.     Capillary Refill: Capillary refill takes less than 2 seconds.     Coloration: Skin is not jaundiced or pale.     Findings: No bruising, erythema, lesion or rash.  Neurological:     General: No focal deficit present.     Mental Status: He is alert and oriented to person, place, and time. Mental status is at baseline.     Cranial Nerves: No cranial nerve deficit.     Sensory: No sensory deficit.     Motor: No weakness.     Coordination: Coordination normal.     Gait: Gait normal.     Deep Tendon Reflexes: Reflexes normal.  Psychiatric:        Mood and Affect: Mood normal.        Behavior: Behavior normal.        Thought Content: Thought content normal.  Judgment: Judgment normal.    LABS:   CBC Latest Ref Rng & Units 09/25/2020 08/10/2020 03/12/2020  WBC - 5.9 5.8 4.9  Hemoglobin 13.5 - 17.5 13.7 14.1 12.3(A)  Hematocrit 41 - 53 41 41 36(A)  Platelets 150 - 399 156 166 158   CMP Latest Ref Rng & Units 09/25/2020 08/10/2020 03/12/2020  Glucose 70 - 99 mg/dL - - -  BUN 4 - 21 12 17 11   Creatinine 0.6 - 1.3 1.1 1.2 0.9  Sodium 137 - 147 142 143 140  Potassium 3.4 - 5.3 3.9 4.7 3.7  Chloride 99 - 108 105 107 103  CO2 13 - 22 32(A) 26(A) 33(A)  Calcium 8.7 - 10.7 9.2 9.3 8.6(A)  Total Protein 6.5 - 8.1 g/dL - - -  Total Bilirubin 0.3 - 1.2 mg/dL - - -  Alkaline Phos 25 - 125 62 50 40  AST 14 - 40 38 23 15  ALT 10 - 40 22 23 9(A)     No results found for: CEA1 / No results found for: CEA1 No results found for: PSA1 No results found for: No results found for: CAN125  No results found for: QMG867, A1GS, A2GS, BETS, BETA2SER, GAMS, MSPIKE, SPEI Lab Results  Component Value Date   TIBC 275 01/28/2020   TIBC 185 (L) 05/01/2018   TIBC 289 10/09/2017   FERRITIN 75 01/28/2020   FERRITIN 245 05/01/2018   FERRITIN 182 10/09/2017   IRONPCTSAT 17 (L) 01/28/2020   IRONPCTSAT 23 05/01/2018   IRONPCTSAT 80 10/09/2017   Lab Results  Component  Value Date   LDH 188 10/09/2017   LDH 183 08/15/2017   LDH 210 07/04/2017    STUDIES:  No results found.    HISTORY:   Past Medical History:  Diagnosis Date   Anxiety    Bronchiolitis Feb. 2016   DVT (deep venous thrombosis) (HCC)    GERD (gastroesophageal reflux disease)    Hyperlipidemia    Peripheral vascular disease (HCC)    Pneumonia Feb. 2016   PVD (peripheral vascular disease) (HCC) 01/09/2015   Right iliac stent 2007    Past Surgical History:  Procedure Laterality Date   CHOLECYSTECTOMY  10/2015   HAND SURGERY     IVC FILTER PLACEMENT ([facility] HX)  2016    ROTATOR CUFF REPAIR     THROMBOLYSIS Right Nov. 9, 2010   Lower Extrim.   TONSILLECTOMY      Family History  Problem Relation Age of Onset   Deep vein thrombosis Father    Heart disease Father 31       Before age 67   Hyperlipidemia Father    Heart attack Father    Stroke Father     Social History:  reports that he has been smoking cigarettes. He has a 30.00 pack-year smoking history. He has never used smokeless tobacco. He reports that he does not currently use alcohol. He reports that he does not use drugs.The patient is alone  today.  Allergies: No Known Allergies  Current Medications: Current Outpatient Medications  Medication Sig Dispense Refill   amLODipine (NORVASC) 5 MG tablet Take 1 tablet (5 mg total) by mouth daily. 30 tablet 0   atorvastatin (LIPITOR) 40 MG tablet SMARTSIG:1 Tablet(s) By Mouth Every Evening     buPROPion (WELLBUTRIN SR) 100 MG 12 hr tablet Take 100 mg by mouth every morning.     enoxaparin (LOVENOX) 120 MG/0.8ML injection Inject 0.8 mLs (120 mg total) into the skin  daily. 30 mL 5   EPINEPHrine (EPIPEN 2-PAK) 0.3 mg/0.3 mL IJ SOAJ injection 0.3 mg once as needed.     escitalopram (LEXAPRO) 10 MG tablet TAKE ONE TABLET BY MOUTH EVERY EVENING 90 tablet 2   FeFum-FePoly-FA-B Cmp-C-Biot (INTEGRA PLUS) CAPS Take 1 capsule by mouth daily. 90 capsule 2   folic acid (FOLVITE) 1 MG  tablet TAKE ONE TABLET BY MOUTH EVERY EVENING 90 tablet 6   ipratropium-albuterol (DUONEB) 0.5-2.5 (3) MG/3ML SOLN INHALE THE CONTENT OF 1 VIAL Q 4 H PRN WHEEZING     lisinopril (ZESTRIL) 40 MG tablet Take 1 tablet (40 mg total) by mouth daily. 30 tablet 1   magnesium oxide (MAG-OX) 400 MG tablet Take by mouth.     metoprolol tartrate (LOPRESSOR) 25 MG tablet Take 25 mg by mouth 2 (two) times daily. Patient to taper off medication- once a day then stop completely     mirtazapine (REMERON) 45 MG tablet SMARTSIG:1 Tablet(s) By Mouth Every Evening     montelukast (SINGULAIR) 10 MG tablet SMARTSIG:1 Tablet(s) By Mouth Every Evening     omeprazole (PRILOSEC) 40 MG capsule Take by mouth.     potassium chloride (KLOR-CON) 20 MEQ packet Take 20 mEq by mouth.     PROAIR HFA 108 (90 Base) MCG/ACT inhaler inhale TWO PUFF EVERY 4 TO 6 HOURS AS NEEDED FOR coughing/ wheezing/ SHORTNESS OF BREATH  0   tamsulosin (FLOMAX) 0.4 MG CAPS capsule TAKE ONE CAPSULE BY MOUTH EVERY EVENING 30 capsule 2   thiamine (VITAMIN B-1) 100 MG tablet Take 100 mg by mouth daily.     traMADol (ULTRAM) 50 MG tablet Take 1 tablet (50 mg total) by mouth every 6 (six) hours as needed. 90 tablet 0   No current facility-administered medications for this visit.

## 2020-11-26 ENCOUNTER — Other Ambulatory Visit: Payer: Self-pay | Admitting: Family

## 2020-11-26 DIAGNOSIS — I824Z2 Acute embolism and thrombosis of unspecified deep veins of left distal lower extremity: Secondary | ICD-10-CM

## 2020-12-11 ENCOUNTER — Other Ambulatory Visit: Payer: Self-pay | Admitting: Hematology & Oncology

## 2020-12-11 DIAGNOSIS — I2692 Saddle embolus of pulmonary artery without acute cor pulmonale: Secondary | ICD-10-CM

## 2020-12-11 DIAGNOSIS — I82413 Acute embolism and thrombosis of femoral vein, bilateral: Secondary | ICD-10-CM

## 2020-12-11 DIAGNOSIS — I824Z2 Acute embolism and thrombosis of unspecified deep veins of left distal lower extremity: Secondary | ICD-10-CM

## 2020-12-12 ENCOUNTER — Encounter: Payer: Self-pay | Admitting: Family

## 2020-12-29 ENCOUNTER — Other Ambulatory Visit: Payer: Self-pay

## 2020-12-29 DIAGNOSIS — I82401 Acute embolism and thrombosis of unspecified deep veins of right lower extremity: Secondary | ICD-10-CM

## 2020-12-29 MED ORDER — ENOXAPARIN SODIUM 120 MG/0.8ML IJ SOSY: 120.0000 mg | PREFILLED_SYRINGE | INTRAMUSCULAR | 5 refills | Status: DC

## 2021-01-01 ENCOUNTER — Other Ambulatory Visit: Payer: Self-pay | Admitting: Hematology and Oncology

## 2021-01-01 DIAGNOSIS — I82401 Acute embolism and thrombosis of unspecified deep veins of right lower extremity: Secondary | ICD-10-CM

## 2021-01-01 MED ORDER — ENOXAPARIN SODIUM 120 MG/0.8ML IJ SOSY: 120.0000 mg | PREFILLED_SYRINGE | INTRAMUSCULAR | 5 refills | Status: DC

## 2021-02-04 NOTE — Progress Notes (Signed)
[facility]  [address] [place],  [place]  [address] [phone]  Clinic Day:  02/10/2021  Referring physician: Algis Greenhouse, MD  This document serves as a record of services personally performed by [name], MD. It was created on their behalf by Curry,Lauren E, a trained medical scribe. The creation of this record is based on the scribe's personal observations and the provider's statements to them.  ASSESSMENT & PLAN:   Assessment & Plan: 1.  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  He has been seen by Dr. Burney Gauze in the past, and was negative for polycythemia vera.  I have agreed to be his local hematologist since he resides in [place].   He continues Lovenox at 120 mg (1 mg/kg) subcu daily currently.     2.  Episode of pulmonary emboli.   3. Extensive thrombosis of the right lower extremity throughout the leg and occlusive thrombus in the left common femoral vein as well.  This has been debilitating and painful.  He was given Tramadol without much relief, and so has been using Tylenol.  I do consider him disabled based on these recurrent episodes of thrombosis and his postphlebitic syndrome.  He has clot in the inferior vena cava and surrounding his IVC filter so is at extremely high risk for complications.   4. Tobacco abuse.  He has smoked 1 pack per day for at least 30 years.   5. Alcohol abuse.  He has stopped drinking alcohol since January 2022.   6. Hypertension. He is not currently on antihypertensive medication. I advised that he keep a log of his blood pressure readings to review with Dr. Garlon Hatchet.  He knows to continue Lovenox at 120 mg subcu daily for now.  We will see him back in 6 months with CBC and CMP for repeat evaluation.  He understands and agrees with this plan of care.  I have answered his questions and he knows to call with any concerns.  I provided 15  minutes of face-to-face time during this this encounter and > 50% was spent counseling as documented under my assessment and plan.     [name] [address] [place] [address] Dept: [phone] Dept Fax: [phone]   No orders of the defined types were placed in this encounter.    CHIEF COMPLAINT:  CC: History of DVT  Current Treatment:  Lovenox   HISTORY OF PRESENT ILLNESS:  History of recurrent deep venous thrombosis and episode of pulmonary emboli who has an IVC filter in place.  His 1st episode was in December of 2016 and it has always been the right lower extremity.  He was admitted earlier in January 2022 for recurrent DVT but we are not sure about his compliance with the Arixtra.  He presented with worsening deep venous thrombosis of the entire right lower extremity as well as the left with thrombosis of the inferior neck cava filter, with thrombus extending above the filter to the level of the renal vein.  He was having severe pain and 3 to 4+ edema of the right lower extremity, and was unable to stand or ambulate.  He was admitted for IV heparin and was then switched to Lovenox at 1 milligram/kilogram twice daily rather than 1.5 milligrams/kilogram once daily.  The Doppler confirmed extensive deep venous thrombosis throughout the right lower extremity which  is extensive and occlusive.  He has seen a vascular surgeon in the past and has had a stent placed as well as the IVC filter at [facility].  A vascular surgeon was contacted when the patient was in the emergency room and did not have any specific recommendations at this time.  He has seen vascular surgeons in the past.  I ordered oxycodone for the patient's pain and it does give him some relief.  He continues to smoke and was drinking alcohol heavily previously.  At some point, he should probably have removal of the IVC filter, but I feel it  would be too risky at this time. He was discharged on January 29th.  His daughter has undergone genetic testing and does have a MTHFR, homozygous mutation.  I went through his records from Dr. Burney Gauze, and did not see that he was checked for hypercoagulable state, but he was negative for JAK2 mutation (polycythemia vera).  Factor IV Leiden was negative in March.  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  However, he was already on anticoagulant therapy at that time.  We will monitor this periodically.  He also has problems with hypertension and did have a COVID infection in February.  Oncology History   No history exists.     INTERVAL HISTORY:  Aaron Cunningham is here for routine follow up and states that he has been well. He reports chronic arthralgias of the right knee, which he rates as a 4/10 today. He also has chronic edema of the right lower extremity. He continues Lovenox injections as prescribed and does have a knot of the abdomen from this injections. He is hypertensive today and states that he was taken off antihypertensive medication recently. I advised that he keep a log of his blood pressure readings to review with his family doctor. Blood counts and chemistries are unremarkable including a hemoglobin of 15.4. His  appetite is good, and he has gained 8 pounds since his last visit.  He denies fever, chills or other signs of infection.  He denies nausea, vomiting, bowel issues, or abdominal pain.  He denies sore throat, cough, dyspnea, or chest pain.  REVIEW OF SYSTEMS:  Review of Systems  Constitutional: Negative.  Negative for appetite change, chills, fatigue, fever and unexpected weight change.  HENT:  Negative.    Eyes: Negative.   Respiratory: Negative.  Negative for chest tightness, cough, hemoptysis, shortness of breath and wheezing.   Cardiovascular:  Positive for leg swelling (right, chronic). Negative for chest pain and  palpitations.  Gastrointestinal: Negative.  Negative for abdominal distention, abdominal pain, blood in stool, constipation, diarrhea, nausea and vomiting.  Endocrine: Negative.   Genitourinary: Negative.  Negative for difficulty urinating, dysuria, frequency and hematuria.   Musculoskeletal:  Positive for arthralgias (right knee, chronic). Negative for back pain, flank pain, gait problem and myalgias.  Skin: Negative.   Neurological: Negative.  Negative for dizziness, extremity weakness, gait problem, headaches, light-headedness, numbness, seizures and speech difficulty.  Hematological: Negative.   Psychiatric/Behavioral: Negative.  Negative for depression and sleep disturbance. The patient is not nervous/anxious.   All other systems reviewed and are negative.   VITALS:  Blood pressure (!) 179/88, pulse 65, temperature 98.4 F (36.9 C), temperature source Oral, resp. rate 18, height 5\' 9"  (1.753 m), weight 186 lb 1.6 oz (84.4 kg), SpO2 99 %.  Wt Readings from Last 3 Encounters:  02/10/21 186 lb 1.6 oz (84.4 kg)  10/12/20 178 lb (  80.7 kg)  09/25/20 175 lb 4 oz (79.5 kg)    Body mass index is 27.48 kg/m.  Performance status (ECOG): 1 - Symptomatic but completely ambulatory  PHYSICAL EXAM:  Physical Exam Constitutional:      General: He is not in acute distress.    Appearance: Normal appearance. He is normal weight.  HENT:     Head: Normocephalic and atraumatic.  Eyes:     General: No scleral icterus.    Extraocular Movements: Extraocular movements intact.     Conjunctiva/sclera: Conjunctivae normal.     Pupils: Pupils are equal, round, and reactive to light.  Cardiovascular:     Rate and Rhythm: Normal rate and regular rhythm.     Pulses: Normal pulses.     Heart sounds: Normal heart sounds. No murmur heard.   No friction rub. No gallop.  Pulmonary:     Effort: Pulmonary effort is normal. No respiratory distress.     Breath sounds: Normal breath sounds.  Abdominal:      General: Bowel sounds are normal. There is no distension.     Palpations: Abdomen is soft. There is no hepatomegaly, splenomegaly or mass.     Tenderness: There is no abdominal tenderness.  Musculoskeletal:        General: Normal range of motion.     Cervical back: Normal range of motion and neck supple.     Right lower leg: 1+ Edema present.     Left lower leg: No edema.  Lymphadenopathy:     Cervical: No cervical adenopathy.  Skin:    General: Skin is warm and dry.  Neurological:     General: No focal deficit present.     Mental Status: He is alert and oriented to person, place, and time. Mental status is at baseline.  Psychiatric:        Mood and Affect: Mood normal.        Behavior: Behavior normal.        Thought Content: Thought content normal.        Judgment: Judgment normal.    LABS:   CBC Latest Ref Rng & Units 02/10/2021 09/25/2020 08/10/2020  WBC - 6.6 5.9 5.8  Hemoglobin 13.5 - 17.5 15.4 13.7 14.1  Hematocrit 41 - 53 46 41 41  Platelets 150 - 399 198 156 166   CMP Latest Ref Rng & Units 02/10/2021 09/25/2020 08/10/2020  Glucose 70 - 99 mg/dL - - -  BUN 4 - 21 15 12 17   Creatinine 0.6 - 1.3 1.2 1.1 1.2  Sodium 137 - 147 142 142 143  Potassium 3.4 - 5.3 4.2 3.9 4.7  Chloride 99 - 108 104 105 107  CO2 13 - 22 29(A) 32(A) 26(A)  Calcium 8.7 - 10.7 9.2 9.2 9.3  Total Protein 6.5 - 8.1 g/dL - - -  Total Bilirubin 0.3 - 1.2 mg/dL - - -  Alkaline Phos 25 - 125 72 62 50  AST 14 - 40 31 38 23  ALT 10 - 40 18 22 23     Lab Results  Component Value Date   TIBC 275 01/28/2020   TIBC 185 (L) 05/01/2018   TIBC 289 10/09/2017   FERRITIN 75 01/28/2020   FERRITIN 245 05/01/2018   FERRITIN 182 10/09/2017   IRONPCTSAT 17 (L) 01/28/2020   IRONPCTSAT 23 05/01/2018   IRONPCTSAT 80 10/09/2017   Lab Results  Component Value Date   LDH 188 10/09/2017   LDH 183 08/15/2017   LDH 210 07/04/2017  STUDIES:  No results found.    HISTORY:   Allergies: No Known  Allergies  Current Medications: Current Outpatient Medications  Medication Sig Dispense Refill   amLODipine (NORVASC) 5 MG tablet Take 1 tablet (5 mg total) by mouth daily. 30 tablet 0   atorvastatin (LIPITOR) 40 MG tablet SMARTSIG:1 Tablet(s) By Mouth Every Evening     buPROPion (WELLBUTRIN SR) 100 MG 12 hr tablet Take 100 mg by mouth every morning.     enoxaparin (LOVENOX) 120 MG/0.8ML injection Inject 0.8 mLs (120 mg total) into the skin daily. 30 mL 5   EPINEPHrine (EPIPEN 2-PAK) 0.3 mg/0.3 mL IJ SOAJ injection 0.3 mg once as needed.     escitalopram (LEXAPRO) 10 MG tablet TAKE ONE TABLET BY MOUTH EVERY EVENING 90 tablet 2   FeFum-FePoly-FA-B Cmp-C-Biot (INTEGRA PLUS) CAPS Take 1 capsule by mouth daily. 90 capsule 2   folic acid (FOLVITE) 1 MG tablet TAKE ONE TABLET BY MOUTH EVERY EVENING 90 tablet 6   ipratropium-albuterol (DUONEB) 0.5-2.5 (3) MG/3ML SOLN INHALE THE CONTENT OF 1 VIAL Q 4 H PRN WHEEZING     lisinopril (ZESTRIL) 40 MG tablet Take 1 tablet (40 mg total) by mouth daily. 30 tablet 1   magnesium oxide (MAG-OX) 400 MG tablet Take by mouth.     metoprolol tartrate (LOPRESSOR) 25 MG tablet Take 25 mg by mouth 2 (two) times daily. Patient to taper off medication- once a day then stop completely     mirtazapine (REMERON) 45 MG tablet SMARTSIG:1 Tablet(s) By Mouth Every Evening     montelukast (SINGULAIR) 10 MG tablet SMARTSIG:1 Tablet(s) By Mouth Every Evening     omeprazole (PRILOSEC) 40 MG capsule Take by mouth.     potassium chloride (KLOR-CON) 20 MEQ packet Take 20 mEq by mouth.     PROAIR HFA 108 (90 Base) MCG/ACT inhaler inhale TWO PUFF EVERY 4 TO 6 HOURS AS NEEDED FOR coughing/ wheezing/ SHORTNESS OF BREATH  0   tamsulosin (FLOMAX) 0.4 MG CAPS capsule TAKE ONE CAPSULE BY MOUTH EVERY EVENING 30 capsule 2   thiamine (VITAMIN B-1) 100 MG tablet Take 100 mg by mouth daily.     traMADol (ULTRAM) 50 MG tablet Take 1 tablet (50 mg total) by mouth every 6 (six) hours as needed. 90  tablet 0   No current facility-administered medications for this visit.      I, Rita Ohara, am acting as scribe for Derwood Kaplan, MD  I have reviewed this report as typed by the medical scribe, and it is complete and accurate.

## 2021-02-10 ENCOUNTER — Encounter: Payer: Self-pay | Admitting: Oncology

## 2021-02-10 ENCOUNTER — Telehealth: Payer: Self-pay | Admitting: Oncology

## 2021-02-10 ENCOUNTER — Inpatient Hospital Stay: Payer: BC Managed Care – PPO | Attending: Oncology

## 2021-02-10 ENCOUNTER — Inpatient Hospital Stay (INDEPENDENT_AMBULATORY_CARE_PROVIDER_SITE_OTHER): Payer: BC Managed Care – PPO | Admitting: Oncology

## 2021-02-10 ENCOUNTER — Other Ambulatory Visit: Payer: Self-pay | Admitting: Oncology

## 2021-02-10 ENCOUNTER — Other Ambulatory Visit: Payer: Self-pay

## 2021-02-10 VITALS — BP 179/88 | HR 65 | Temp 98.4°F | Resp 18 | Ht 69.0 in | Wt 186.1 lb

## 2021-02-10 DIAGNOSIS — I10 Essential (primary) hypertension: Secondary | ICD-10-CM | POA: Diagnosis not present

## 2021-02-10 DIAGNOSIS — Z86711 Personal history of pulmonary embolism: Secondary | ICD-10-CM | POA: Diagnosis not present

## 2021-02-10 DIAGNOSIS — I82401 Acute embolism and thrombosis of unspecified deep veins of right lower extremity: Secondary | ICD-10-CM

## 2021-02-10 DIAGNOSIS — Z79899 Other long term (current) drug therapy: Secondary | ICD-10-CM | POA: Diagnosis not present

## 2021-02-10 DIAGNOSIS — F1010 Alcohol abuse, uncomplicated: Secondary | ICD-10-CM | POA: Diagnosis not present

## 2021-02-10 DIAGNOSIS — F17210 Nicotine dependence, cigarettes, uncomplicated: Secondary | ICD-10-CM | POA: Diagnosis not present

## 2021-02-10 DIAGNOSIS — Z86718 Personal history of other venous thrombosis and embolism: Secondary | ICD-10-CM | POA: Diagnosis present

## 2021-02-10 DIAGNOSIS — Z7901 Long term (current) use of anticoagulants: Secondary | ICD-10-CM | POA: Diagnosis not present

## 2021-02-10 LAB — BASIC METABOLIC PANEL
BUN: 15 (ref 4–21)
CO2: 29 — AB (ref 13–22)
Chloride: 104 (ref 99–108)
Creatinine: 1.2 (ref 0.6–1.3)
Glucose: 107
Potassium: 4.2 (ref 3.4–5.3)
Sodium: 142 (ref 137–147)

## 2021-02-10 LAB — CBC AND DIFFERENTIAL
HCT: 46 (ref 41–53)
Hemoglobin: 15.4 (ref 13.5–17.5)
Neutrophils Absolute: 3.43
Platelets: 198 (ref 150–399)
WBC: 6.6

## 2021-02-10 LAB — HEPATIC FUNCTION PANEL
ALT: 18 (ref 10–40)
AST: 31 (ref 14–40)
Alkaline Phosphatase: 72 (ref 25–125)
Bilirubin, Total: 0.8

## 2021-02-10 LAB — CBC: RBC: 4.82 (ref 3.87–5.11)

## 2021-02-10 LAB — COMPREHENSIVE METABOLIC PANEL
Albumin: 4.8 (ref 3.5–5.0)
Calcium: 9.2 (ref 8.7–10.7)

## 2021-02-10 LAB — VITAMIN B12: Vitamin B-12: 299 pg/mL (ref 180–914)

## 2021-02-10 NOTE — Telephone Encounter (Signed)
Patient has been scheduled for follow-up visit per 12/21/21 los. Pt given an appt calendar with date and time.  

## 2021-02-11 ENCOUNTER — Ambulatory Visit: Payer: BC Managed Care – PPO | Admitting: Hematology and Oncology

## 2021-02-11 ENCOUNTER — Other Ambulatory Visit: Payer: BC Managed Care – PPO

## 2021-02-12 LAB — CARDIOLIPIN ANTIBODIES, IGM+IGG
Anticardiolipin IgG: <9 GPL U/mL (ref 0–14)
Anticardiolipin IgM: 18 MPL U/mL — ABNORMAL HIGH (ref 0–12)

## 2021-02-19 ENCOUNTER — Encounter: Payer: Self-pay | Admitting: Family

## 2021-03-03 DIAGNOSIS — Z8719 Personal history of other diseases of the digestive system: Secondary | ICD-10-CM | POA: Insufficient documentation

## 2021-03-04 ENCOUNTER — Telehealth: Payer: Self-pay

## 2021-03-04 NOTE — Telephone Encounter (Signed)
Per Dr. Gilman Buttner:  Hold Lovenox for 24hrs prior to colonoscopy and endoscopy.  Darl Pikes aware and will inform patient.

## 2021-03-16 ENCOUNTER — Other Ambulatory Visit: Payer: Self-pay | Admitting: Hematology & Oncology

## 2021-03-16 DIAGNOSIS — I824Z2 Acute embolism and thrombosis of unspecified deep veins of left distal lower extremity: Secondary | ICD-10-CM

## 2021-03-16 DIAGNOSIS — I82413 Acute embolism and thrombosis of femoral vein, bilateral: Secondary | ICD-10-CM

## 2021-03-16 DIAGNOSIS — F419 Anxiety disorder, unspecified: Secondary | ICD-10-CM

## 2021-03-16 DIAGNOSIS — I2692 Saddle embolus of pulmonary artery without acute cor pulmonale: Secondary | ICD-10-CM

## 2021-04-08 ENCOUNTER — Other Ambulatory Visit: Payer: Self-pay | Admitting: Oncology

## 2021-04-08 DIAGNOSIS — I82401 Acute embolism and thrombosis of unspecified deep veins of right lower extremity: Secondary | ICD-10-CM

## 2021-04-11 ENCOUNTER — Encounter: Payer: Self-pay | Admitting: Family

## 2021-06-02 ENCOUNTER — Other Ambulatory Visit: Payer: Self-pay | Admitting: Hematology and Oncology

## 2021-06-02 ENCOUNTER — Telehealth: Payer: Self-pay

## 2021-06-02 DIAGNOSIS — I82401 Acute embolism and thrombosis of unspecified deep veins of right lower extremity: Secondary | ICD-10-CM

## 2021-06-02 DIAGNOSIS — R252 Cramp and spasm: Secondary | ICD-10-CM

## 2021-06-02 NOTE — Assessment & Plan Note (Addendum)
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  Evaluation did not reveal any hypercoagulable disorder.  We saw him when he was hospitalized in August 2016, at which time we checked a JAK2 mutation for possible polycythemia vera, which was negative.  He then followed with Dr. [name] as an outpatient.  He has since had multiple episodes of right lower extremity deep venous thrombosis.  He was seen in the emergency room in early January 2022 and found to have nonocclusive thrombus extending above the level of the inferior vena cava filter despite Arixtra 7.5 mg subcu daily, however, there was questionable compliance with the Arixtra.  He was then placed on enoxaparin 1.5mg /kg once daily.  He was admitted again in later in January 2022 with severe right lower extremity pain and inability to ambulate.  He was found to have recurrent extensive deep venous thrombosis throughout the right lower extremity with occlusive thrombus in the contralateral left common femoral vein.  Apparently, he did not continue enoxaparin.  He was discharged home on enoxaparin 1 mg/kg twice daily.  He has done fairly well except for chronic swelling of his leg.  He was switched to enoxaparin 1.5 mg daily in July 2022.

## 2021-06-02 NOTE — Telephone Encounter (Signed)
I spoke with pt in triage room. He reports that he has had cramps in his right calf, foot and sometimes his hands for 2 weeks. Pt has hx large clot in right calf and is on daily Lovenox. He denies any missed doses of Lovenox. I asked pt when he had labs done . He replied "when I saw Dr Gilman Buttner last". Pt pulled up his right jean leg to show me his leg. The right leg is swollen, but is baseline per pt and medical record hx per [facility]. Kelli offered to see pt tomorrow w/labs and f/u. Pt states he would feel better to get his labs done and see someone. Appt made for tomorrow @ 9am. Before he left, I did ask pt to lift up both jean legs to compare them. Right calf is more swollen than right per baseline. ?

## 2021-06-03 ENCOUNTER — Inpatient Hospital Stay: Payer: BC Managed Care – PPO

## 2021-06-03 ENCOUNTER — Other Ambulatory Visit: Payer: Self-pay

## 2021-06-03 ENCOUNTER — Inpatient Hospital Stay: Payer: BC Managed Care – PPO | Attending: Hematology and Oncology | Admitting: Hematology and Oncology

## 2021-06-03 ENCOUNTER — Encounter: Payer: Self-pay | Admitting: Hematology and Oncology

## 2021-06-03 VITALS — BP 144/84 | HR 80 | Temp 98.3°F | Resp 18 | Ht 69.0 in | Wt 179.7 lb

## 2021-06-03 DIAGNOSIS — M25551 Pain in right hip: Secondary | ICD-10-CM

## 2021-06-03 DIAGNOSIS — Z86711 Personal history of pulmonary embolism: Secondary | ICD-10-CM | POA: Insufficient documentation

## 2021-06-03 DIAGNOSIS — M25559 Pain in unspecified hip: Secondary | ICD-10-CM | POA: Insufficient documentation

## 2021-06-03 DIAGNOSIS — Z86718 Personal history of other venous thrombosis and embolism: Secondary | ICD-10-CM | POA: Insufficient documentation

## 2021-06-03 DIAGNOSIS — Z7901 Long term (current) use of anticoagulants: Secondary | ICD-10-CM | POA: Diagnosis not present

## 2021-06-03 DIAGNOSIS — R252 Cramp and spasm: Secondary | ICD-10-CM | POA: Diagnosis not present

## 2021-06-03 DIAGNOSIS — Z8639 Personal history of other endocrine, nutritional and metabolic disease: Secondary | ICD-10-CM | POA: Diagnosis not present

## 2021-06-03 DIAGNOSIS — I82401 Acute embolism and thrombosis of unspecified deep veins of right lower extremity: Secondary | ICD-10-CM

## 2021-06-03 DIAGNOSIS — I82411 Acute embolism and thrombosis of right femoral vein: Secondary | ICD-10-CM | POA: Diagnosis not present

## 2021-06-03 LAB — BASIC METABOLIC PANEL
BUN: 18 (ref 4–21)
CO2: 30 — AB (ref 13–22)
Chloride: 104 (ref 99–108)
Creatinine: 1.3 (ref 0.6–1.3)
Glucose: 121
Potassium: 3.6 mEq/L (ref 3.5–5.1)
Sodium: 144 (ref 137–147)

## 2021-06-03 LAB — HEPATIC FUNCTION PANEL
ALT: 20 U/L (ref 10–40)
AST: 23 (ref 14–40)
Alkaline Phosphatase: 89 (ref 25–125)
Bilirubin, Total: 0.9

## 2021-06-03 LAB — CBC AND DIFFERENTIAL
HCT: 45 (ref 41–53)
Hemoglobin: 15.1 (ref 13.5–17.5)
Neutrophils Absolute: 5.33
Platelets: 192 10*3/uL (ref 150–400)
WBC: 8.2

## 2021-06-03 LAB — COMPREHENSIVE METABOLIC PANEL
Albumin: 4.7 (ref 3.5–5.0)
Calcium: 9.2 (ref 8.7–10.7)

## 2021-06-03 LAB — VITAMIN B12: Vitamin B-12: 286 pg/mL (ref 180–914)

## 2021-06-03 LAB — FOLATE: Folate: 31.7 ng/mL (ref >5.9)

## 2021-06-03 LAB — CBC
MCV: 95 — AB (ref 80–94)
RBC: 4.79 (ref 3.87–5.11)

## 2021-06-03 LAB — MAGNESIUM: Magnesium: 1.7 (ref 1.6–2.3)

## 2021-06-03 NOTE — Assessment & Plan Note (Addendum)
Daily right lower extremity calf severe muscle cramping for about 2 weeks.  He states he occasionally will have cramping in his hands as well.  The patient has a history of previous hypokalemia and hypomagnesemia.  Labs today do not reveal any electrolyte abnormality.  He knows to drink continue to plenty of fluids.  I will also check a [id number] and folic acid level today.  I will have him start a vitamin B complex with 30 mg of vitamin B6 3 times a day as initial treatment.  We also discussed strategies for reducing the chance of cramping such, as stretching exercises prior to bedtime.

## 2021-06-03 NOTE — Progress Notes (Signed)
[facility]  [address] 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  Evaluation did not reveal any hypercoagulable disorder.  We saw him when he was hospitalized in August 2016, at which time we checked a JAK2 mutation for possible polycythemia vera, which was negative.  He then followed with Dr. Myna Cunningham as an outpatient.  He has since had multiple episodes of right lower extremity deep venous thrombosis.  He was seen in the emergency room in early January 2022 and found to have nonocclusive thrombus extending above the level of the inferior vena cava filter despite Arixtra 7.5 mg subcu daily, however, there was questionable compliance with the Arixtra.  He was then placed on enoxaparin 1.5mg /kg once daily.  He was admitted again in later in January 2022 with severe right lower extremity pain and inability to ambulate.  He was found to have recurrent extensive deep venous thrombosis throughout the right lower extremity with occlusive thrombus in the contralateral left common femoral vein.  Apparently, he did not continue enoxaparin.  He was discharged home on enoxaparin 1 mg/kg twice daily.  He has done fairly well except for chronic swelling of his leg.  He was switched to enoxaparin 1.5 mg daily in July 2022.  Leg cramping Daily right lower extremity calf severe muscle cramping for about 2 weeks.  He states he occasionally will have cramping in his hands as well.  The patient has a history of previous hypokalemia and hypomagnesemia.  Labs today do not reveal any electrolyte abnormality.  He knows to drink continue to plenty of fluids.  I will also check a B12 and folic acid level today.  I will have him start a vitamin B complex with 30 mg of  vitamin B6 3 times a day as initial treatment.  We also discussed strategies for reducing the chance of cramping such, as stretching exercises prior to bedtime.  Hip pain He reports increasing right hip pain, in addition to chronic numbness of the left lateral thigh, which may be due to degenerative disease.  I advised him to follow-up with his primary care physician for further evaluation and treatment.   He is scheduled to see me for routine follow in July with repeat labs and we will keep that appointment.  The patient understands the plans discussed today and is in agreement with them.  He knows to contact our office if he continues to have severe cramping or develops concerns prior to his next appointment.   I provided 30 minutes of face-to-face time during this encounter and > 50% was spent counseling as documented under my assessment and plan.    Aaron Perl, PA-C  [facility] [address] [place] [address] Dept: [phone] Dept Fax: [phone]   Orders Placed This Encounter  Procedures   CBC and differential    This external order was created through the Results Console.   CBC    This external order was created through the Results Console.   CBC    This order was created through External Result Entry   Folate    Standing Status:   Future    Number of Occurrences:   1    Standing  Expiration Date:   06/04/2022   Vitamin B12    Standing Status:   Future    Number of Occurrences:   1    Standing Expiration Date:   06/04/2022   Basic metabolic panel    This external order was created through the Results Console.   Comprehensive metabolic panel    This external order was created through the Results Console.   Hepatic function panel    This external order was created through the Results Console.   Vitamin B12   Folate   Magnesium    This order was created through External Result Entry      CHIEF  COMPLAINT:  CC:  Right leg cramping  Current Treatment: Evaluation  HISTORY OF PRESENT ILLNESS:  Aaron Cunningham is a 56 year old male with a history of recurrent deep venous thrombosis, as well as an episode of pulmonary emboli.  He has an IVC filter in place.  His 1st episode of right lower extremity DVT was in May 2016.  All of his recurrent DVTs have been in the right lower extremity.  Complete evaluation in May 2016 did not reveal any evidence of a hypercoagulable state.  He also had polycythemia and JAK2 mutation was negative.  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  We saw him in the hospital in August 2016, but then he continued to follow-up with Dr. Myna Cunningham as an outpatient. He had a single mutation of MTHFR, which is not associated with increased risk of thrombosis.  He was admitted in January 2022 for recurrent right lower extremity DVT, but with questionable compliance with Arixtra.  He was found to have worsening deep venous thrombosis of the entire right lower extremity, as well as the left with thrombosis of the inferior neck cava filter, with thrombus extending above the filter to the level of the renal vein.  He was having severe pain with 3 to 4+ edema of the right lower extremity, and was unable to stand or ambulate.  He was admitted for IV heparin and was then switched to enoxaparin 1 mg/kg twice daily.  He has seen a vascular surgeon in the past and has had a stent placed this man at [facility].  A vascular surgeon was contacted when the patient was in the emergency room and did not have any specific recommendations at that time.  We began seeing him again in January 2022 during the hospital admission and have been following him since.  He was discharged on enoxaparin 1 mg/kg twice daily.  INTERVAL HISTORY:  Aaron Cunningham is added to the schedule today as he walked in yesterday reporting severe right calf muscle cramping daily for the past 2 weeks.  He  states he occasionally has cramping of his hands.  He feels he is drinking plenty of fluids.  He has had previous hypokalemia and hypomagnesemia.  He states he continues enoxaparin 120 mg daily and denies missed doses.  He also reports increasing right hip pain.  He has occasional low back pain which is not severe.  He reports numbness in the left lateral thigh.  Review of his records reveal he had a plain x-ray of the thoracic and lumbar spine in October 2021 for pain.  There was stable mild height loss of several mid to lower thoracic vertebral bodies with normal alignment the stent projected over the L5 vertebral body.  There was disc height loss at L5-S1.  He denies fevers or chills. He denies pain. His  appetite is good. His weight has decreased 7 pounds over last 4 months .  REVIEW OF SYSTEMS:  Review of Systems  Constitutional:  Positive for fatigue. Negative for appetite change, chills, fever and unexpected weight change.  HENT:   Negative for lump/mass, mouth sores and sore throat.   Respiratory:  Negative for cough and shortness of breath.   Cardiovascular:  Negative for chest pain and leg swelling.  Gastrointestinal:  Negative for abdominal pain, constipation, diarrhea, nausea and vomiting.  Genitourinary:  Negative for difficulty urinating, dysuria, frequency and hematuria.   Musculoskeletal:  Positive for arthralgias (right hip), back pain (occasional low back) and gait problem (right leg limp). Negative for myalgias.  Skin:  Negative for itching, rash and wound.  Neurological:  Positive for gait problem (right leg limp) and numbness (left thigh). Negative for dizziness, extremity weakness, headaches and light-headedness.  Hematological:  Negative for adenopathy.  Psychiatric/Behavioral:  Negative for depression and sleep disturbance. The patient is not nervous/anxious.     VITALS:  Blood pressure (!) 144/84, pulse 80, temperature 98.3 F (36.8 C), temperature source Oral, resp. rate  18, height  (1.753 m), weight 179 lb 11.2 oz (81.5 kg), SpO2 97 %.  Wt Readings from Last 3 Encounters:  06/03/21 179 lb 11.2 oz (81.5 kg)  02/10/21 186 lb 1.6 oz (84.4 kg)  10/12/20 178 lb (80.7 kg)    Body mass index is 26.54 kg/m.  Performance status (ECOG): 1 - Symptomatic but completely ambulatory  PHYSICAL EXAM:  Physical Exam Vitals and nursing note reviewed.  Constitutional:      General: He is not in acute distress.    Appearance: Normal appearance. He is normal weight.  HENT:     Head: Normocephalic and atraumatic.     Mouth/Throat:     Mouth: Mucous membranes are moist.     Pharynx: Oropharynx is clear. No oropharyngeal exudate or posterior oropharyngeal erythema.  Eyes:     General: No scleral icterus.    Extraocular Movements: Extraocular movements intact.     Conjunctiva/sclera: Conjunctivae normal.     Pupils: Pupils are equal, round, and reactive to light.  Cardiovascular:     Rate and Rhythm: Normal rate and regular rhythm.     Heart sounds: Normal heart sounds. No murmur heard.   No friction rub. No gallop.  Pulmonary:     Effort: Pulmonary effort is normal.     Breath sounds: Normal breath sounds. No wheezing, rhonchi or rales.  Abdominal:     General: Bowel sounds are normal. There is no distension.     Palpations: Abdomen is soft. There is no mass.     Tenderness: There is no abdominal tenderness.  Musculoskeletal:        General: Tenderness (slight right calf tenderness) present. Normal range of motion.     Cervical back: Normal range of motion and neck supple. No tenderness.     Right lower leg: Edema (stable, chronic mild edema) present.     Left lower leg: No edema.  Lymphadenopathy:     Cervical: No cervical adenopathy.  Skin:    General: Skin is warm and dry.     Coloration: Skin is not jaundiced.     Findings: No rash.  Neurological:     Mental Status: He is alert and oriented to person, place, and time.     Cranial Nerves: No  cranial nerve deficit.  Psychiatric:        Mood and Affect: Mood normal.  Behavior: Behavior normal.        Thought Content: Thought content normal.    LABS:      Latest Ref Rng & Units 06/03/2021   12:00 AM 02/10/2021   12:00 AM 09/25/2020   12:00 AM  CBC  WBC  8.2      6.6   5.9       Hemoglobin 13.5 - 17.5 15.1      15.4   13.7       Hematocrit 41 - 53 45      46   41       Platelets 150 - 400 K/uL 192      198   156          This result is from an external source.      Latest Ref Rng & Units 06/03/2021   12:00 AM 02/10/2021   12:00 AM 09/25/2020   12:00 AM  CMP  BUN 4 - 21 18      15   12        Creatinine 0.6 - 1.3 1.3      1.2   1.1       Sodium 137 - 147 144      142   142       Potassium 3.5 - 5.1 mEq/L 3.6      4.2   3.9       Chloride 99 - 108 104      104   105       CO2 13 - 22 30      29    32       Calcium 8.7 - 10.7 9.2      9.2   9.2       Alkaline Phos 25 - 125 89      72   62       AST 14 - 40 23      31   38       ALT 10 - 40 U/L 20      18   22           This result is from an external source.     No results found for: CEA1 / No results found for: CEA1 No results found for: PSA1 No results found for: [id number] No results found for: CAN125  No results found for: Dorene ArOTALPROTELP, ALBUMINELP, A1GS, A2GS, BETS, BETA2SER, GAMS, MSPIKE, SPEI Lab Results  Component Value Date   TIBC 275 01/28/2020   TIBC 185 (L) 05/01/2018   TIBC 289 10/09/2017   FERRITIN 75 01/28/2020   FERRITIN 245 05/01/2018   FERRITIN 182 10/09/2017   IRONPCTSAT 17 (L) 01/28/2020   IRONPCTSAT 23 05/01/2018   IRONPCTSAT 80 10/09/2017   Lab Results  Component Value Date   LDH 188 10/09/2017   LDH 183 08/15/2017   LDH 210 07/04/2017    STUDIES:  No results found.    HISTORY:   Past Medical History:  Diagnosis Date   Anxiety    Bronchiolitis Feb. 2016   DVT (deep venous thrombosis) (HCC)    GERD (gastroesophageal reflux disease)    Hyperlipidemia    Peripheral  vascular disease (HCC)    Pneumonia Feb. 2016   PVD (peripheral vascular disease) (HCC) 01/09/2015   Right iliac stent 2007    Past Surgical History:  Procedure Laterality Date   CHOLECYSTECTOMY  10/2015   HAND SURGERY     IVC FILTER PLACEMENT ([facility] HX)  2016    ROTATOR CUFF REPAIR     THROMBOLYSIS Right Nov. 9, 2010   Lower Extrim.   TONSILLECTOMY      Family History  Problem Relation Age of Onset   Deep vein thrombosis Father    Heart disease Father 16       Before age 60   Hyperlipidemia Father    Heart attack Father    Stroke Father     Social History:  reports that he has been smoking cigarettes. He has a 30.00 pack-year smoking history. He has never used smokeless tobacco. He reports that he does not currently use alcohol. He reports that he does not use drugs.The patient is alone today.  Allergies: No Known Allergies  Current Medications: Current Outpatient Medications  Medication Sig Dispense Refill   albuterol (VENTOLIN HFA) 108 (90 Base) MCG/ACT inhaler Inhale 2 puffs into the lungs every 4 (four) hours as needed.     folic acid (FOLVITE) 1 MG tablet Take by mouth.     ipratropium-albuterol (DUONEB) 0.5-2.5 (3) MG/3ML SOLN Inhale into the lungs.     omeprazole (PRILOSEC) 40 MG capsule Take by mouth.     tamsulosin (FLOMAX) 0.4 MG CAPS capsule Take 1 capsule by mouth daily.     atorvastatin (LIPITOR) 40 MG tablet SMARTSIG:1 Tablet(s) By Mouth Every Evening     benzonatate (TESSALON) 200 MG capsule Take 200 mg by mouth 3 (three) times daily as needed.     buPROPion (WELLBUTRIN SR) 100 MG 12 hr tablet Take 100 mg by mouth every morning.     enoxaparin (LOVENOX) 120 MG/0.8ML injection INJECT 0.8 MLS(120 MG TOTAL) INTO THE SKIN DAILY 24 mL 5   EPINEPHrine (EPIPEN 2-PAK) 0.3 mg/0.3 mL IJ SOAJ injection 0.3 mg once as needed.     escitalopram (LEXAPRO) 10 MG tablet TAKE ONE TABLET BY MOUTH EVERY EVENING 90 tablet 2   fluticasone (FLONASE) 50 MCG/ACT nasal spray Place 1  spray into both nostrils daily.     mirtazapine (REMERON) 45 MG tablet SMARTSIG:1 Tablet(s) By Mouth Every Evening     montelukast (SINGULAIR) 10 MG tablet SMARTSIG:1 Tablet(s) By Mouth Every Evening     thiamine (VITAMIN B-1) 100 MG tablet Take 100 mg by mouth daily.     No current facility-administered medications for this visit.

## 2021-06-03 NOTE — Assessment & Plan Note (Signed)
He reports increasing right hip pain, in addition to chronic numbness of the left lateral thigh, which may be due to degenerative disease.  I advised him to follow-up with his primary care physician for further evaluation and treatment.

## 2021-06-11 ENCOUNTER — Other Ambulatory Visit: Payer: Self-pay | Admitting: Hematology & Oncology

## 2021-06-11 DIAGNOSIS — I824Z2 Acute embolism and thrombosis of unspecified deep veins of left distal lower extremity: Secondary | ICD-10-CM

## 2021-06-11 DIAGNOSIS — I82413 Acute embolism and thrombosis of femoral vein, bilateral: Secondary | ICD-10-CM

## 2021-06-17 DIAGNOSIS — Z9889 Other specified postprocedural states: Secondary | ICD-10-CM

## 2021-06-17 DIAGNOSIS — I739 Peripheral vascular disease, unspecified: Secondary | ICD-10-CM | POA: Insufficient documentation

## 2021-06-17 HISTORY — DX: Other specified postprocedural states: Z98.890

## 2021-07-06 DIAGNOSIS — R252 Cramp and spasm: Secondary | ICD-10-CM | POA: Insufficient documentation

## 2021-08-05 ENCOUNTER — Other Ambulatory Visit: Payer: Self-pay

## 2021-08-09 NOTE — Assessment & Plan Note (Signed)
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  Evaluation did not reveal any hypercoagulable disorder.  We saw him when he was hospitalized in August 2016, at which time we checked a JAK2 mutation for possible polycythemia vera, which was negative.  He then followed with Dr. Myna Hidalgo as an outpatient.  He has since had multiple episodes of right lower extremity deep venous thrombosis.  He was seen in the emergency room in early January 2022 and found to have nonocclusive thrombus extending above the level of the inferior vena cava filter despite Arixtra 7.5 mg subcu daily, however, there was questionable compliance with the Arixtra.  He was then placed on enoxaparin 1.5mg /kg once daily.  He was admitted again in later in January 2022 with severe right lower extremity pain and inability to ambulate.  He was found to have recurrent extensive deep venous thrombosis throughout the right lower extremity with occlusive thrombus in the contralateral left common femoral vein.  Apparently, he did not continue enoxaparin.  He was discharged home on enoxaparin 1 mg/kg twice daily.  He has done fairly well except for chronic swelling of his leg.  He was switched to enoxaparin 1.5 mg daily in July 2022.  He remains without evidence of recurrent DVT.

## 2021-08-09 NOTE — Progress Notes (Signed)
[name]  [address] 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    Previous evaluation for hypercoagulable disorder was apparently negative.  We saw him when he was hospitalized in August 2016, at which time we checked a JAK2 mutation for possible polycythemia vera, which was negative.  He then followed with Dr. Myna Hidalgo as an outpatient.  He has since had multiple episodes of right lower extremity deep venous thrombosis.  He was seen in the emergency room in early January 2022 and found to have nonocclusive thrombus extending above the level of the inferior vena cava filter despite Arixtra 7.5 mg subcu daily, however, there was questionable compliance with the Arixtra.  He was then placed on enoxaparin 1.5mg /kg once daily.  He was admitted again in later in January 2022 with severe right lower extremity pain and inability to ambulate.  He was found to have recurrent extensive deep venous thrombosis throughout the right lower extremity with occlusive thrombus in the contralateral left common femoral vein.  Apparently, he did not continue enoxaparin.  He was discharged home on enoxaparin 1 mg/kg twice daily.  He has done fairly well except for chronic swelling of his leg.  He was switched to enoxaparin 1.5 mg daily in July 2022.  He remains without evidence of recurrent DVT.  He states the enoxaparin injections are painful and is asking to switch to fondaparinux, which he has had in the past, as he felt these injections were less painful.  I will go ahead and send in a prescription for Arixtra 7.5 mg subcutaneously daily.  We will plan to see him back in 6 months for repeat clinical assessment.   The patient understands the plans discussed  today and is in agreement with them.  He knows to contact our office if he develops concerns prior to his next appointment.   I provided 20 minutes of face-to-face time during this encounter and > 50% was spent counseling as documented under my assessment and plan.    Adah Perl, PA-C  [facility] [address] [place] [address] Dept: [phone] Dept Fax: [phone]   No orders of the defined types were placed in this encounter.     CHIEF COMPLAINT:  CC: Chronic deep vein thrombosis  Current Treatment: Enoxaparin 120 mg subcutaneously daily  HISTORY OF PRESENT ILLNESS:  Aaron Cunningham is a 56 year old male with a history of recurrent deep venous thrombosis, as well as an episode of pulmonary emboli.  He has an IVC filter in place.  His 1st episode of right lower extremity DVT was in May 2016.  All of his recurrent DVTs have been in the right lower extremity.  Complete evaluation in May 2016 did not reveal any evidence of a hypercoagulable state.  He also had polycythemia and JAK2 mutation was negative.  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  We saw him in the hospital in August 2016, but then he continued to follow-up with Dr. Myna Hidalgo as an outpatient. He had a single mutation of MTHFR, which is not associated with increased risk of thrombosis.  He was admitted in January 2022 for recurrent right lower extremity DVT, but with questionable  compliance with Arixtra.  He was found to have worsening deep venous thrombosis of the entire right lower extremity, as well as the left with thrombosis of the inferior neck cava filter, with thrombus extending above the filter to the level of the renal vein.  He was having severe pain with 3 to 4+ edema of the right lower extremity, and was unable to stand or ambulate.  He was admitted for IV heparin and was then switched to enoxaparin 1 mg/kg  twice daily.  He has seen a vascular surgeon in the past and has had a stent [facility].  A vascular surgeon was contacted when the patient was in the emergency room and did not have any specific recommendations at that time.  We began seeing him again in January 2022 during the hospital admission and have been following him since.  He was discharged on enoxaparin 1 mg/kg twice daily.  We switched him to enoxaparin 1.5 mg/kg daily in July 2022.  He has not had recurrent DVTs.  He was seen on the schedule in May due to leg cramping of uncertain etiology.  INTERVAL HISTORY:  Aaron Cunningham is here today for repeat clinical assessment and states he continues enoxaparin 120 mg subcutaneously daily.  He states that shots are painful and he has bruising and hematoma of the lower abdomen.  He is asking to switch to Arixtra.  He denies abnormal bleeding.  He has chronic right calf swelling and pain, but now reports left hip and knee pain.  Of note, on previous CT abdomen and pelvis degenerative skin changes were seen in the lumbar spine. He continues to have leg cramping and was placed on magnesium by Dr. Sol Passerough.  He also had a CTA aorta to evaluate for peripheral arterial disease.  Mild multilevel PAD was seen, but not felt to be the cause of his cramping.   He denies fevers or chills. He denies pain. His appetite is good. His weight has increased 11 pounds over last 2 months .  He had a CTA abdominal aorta with iliofemoral runoff in June.  This revealed multilevel peripheral arterial disease.  He had an EGD and colonoscopy in March which was negative.  REVIEW OF SYSTEMS:  Review of Systems  Constitutional:  Negative for appetite change, chills, fatigue, fever and unexpected weight change.  HENT:   Negative for lump/mass, mouth sores and sore throat.   Respiratory:  Negative for cough and shortness of breath.   Cardiovascular:  Negative for chest pain and leg swelling.  Gastrointestinal:  Negative  for abdominal pain, constipation, diarrhea, nausea and vomiting.  Genitourinary:  Negative for difficulty urinating, dysuria, frequency and hematuria.   Musculoskeletal:  Positive for arthralgias. Negative for back pain and myalgias.  Skin:  Negative for itching, rash and wound.  Neurological:  Negative for dizziness, extremity weakness, headaches, light-headedness and numbness.  Hematological:  Negative for adenopathy. Bruises/bleeds easily.  Psychiatric/Behavioral:  Negative for depression and sleep disturbance. The patient is not nervous/anxious.      VITALS:  Blood pressure 133/81, pulse 70, temperature 97.8 F (36.6 C), temperature source Oral, resp. rate 20, height 5\' 9"  (1.753 m), weight 190 lb (86.2 kg), SpO2 94 %.  Wt Readings from Last 3 Encounters:  08/10/21 190 lb (86.2 kg)  06/03/21 179 lb 11.2 oz (81.5 kg)  02/10/21 186 lb 1.6 oz (84.4 kg)    Body mass index is 28.06 kg/m.  Performance status (ECOG): 1 - Symptomatic but completely ambulatory  PHYSICAL EXAM:  Physical Exam Vitals and nursing note reviewed.  Constitutional:      General: He is not in acute distress.    Appearance: Normal appearance. He is normal weight.  HENT:     Head: Normocephalic and atraumatic.     Mouth/Throat:     Mouth: Mucous membranes are moist.     Pharynx: Oropharynx is clear. No oropharyngeal exudate or posterior oropharyngeal erythema.  Eyes:     General: No scleral icterus.    Extraocular Movements: Extraocular movements intact.     Conjunctiva/sclera: Conjunctivae normal.     Pupils: Pupils are equal, round, and reactive to light.  Cardiovascular:     Rate and Rhythm: Normal rate and regular rhythm.     Heart sounds: Normal heart sounds. No murmur heard.    No friction rub. No gallop.  Pulmonary:     Effort: Pulmonary effort is normal.     Breath sounds: Normal breath sounds. No wheezing, rhonchi or rales.  Abdominal:     General: Bowel sounds are normal. There is no  distension.     Palpations: Abdomen is soft. There is no hepatomegaly, splenomegaly or mass.     Tenderness: There is no abdominal tenderness.     Comments: There is bruising and hematoma of the lower abdomen.  Musculoskeletal:        General: Swelling (Right calf, stable) and tenderness (Mild tenderness of the right calf) present. Normal range of motion.     Cervical back: Normal range of motion and neck supple. No tenderness.     Right lower leg: No edema.     Left lower leg: No edema.  Lymphadenopathy:     Cervical: No cervical adenopathy.     Upper Body:     Right upper body: No supraclavicular or axillary adenopathy.     Left upper body: No supraclavicular or axillary adenopathy.     Lower Body: No right inguinal adenopathy. No left inguinal adenopathy.  Skin:    General: Skin is warm and dry.     Coloration: Skin is not jaundiced.     Findings: No rash.  Neurological:     Mental Status: He is alert and oriented to person, place, and time.     Cranial Nerves: No cranial nerve deficit.  Psychiatric:        Mood and Affect: Mood normal.        Behavior: Behavior normal.        Thought Content: Thought content normal.    LABS:      Latest Ref Rng & Units 06/03/2021   12:00 AM 02/10/2021   12:00 AM 09/25/2020   12:00 AM  CBC  WBC  8.2     6.6  5.9      Hemoglobin 13.5 - 17.5 15.1     15.4  13.7      Hematocrit 41 - 53 45     46  41      Platelets 150 - 400 K/uL 192     198  156         This result is from an external source.      Latest Ref Rng & Units 06/03/2021   12:00 AM 02/10/2021   12:00 AM 09/25/2020   12:00 AM  CMP  BUN 4 - 21 18     15  12       Creatinine 0.6 - 1.3 1.3     1.2  1.1  Sodium 137 - 147 144     142  142      Potassium 3.5 - 5.1 mEq/L 3.6     4.2  3.9      Chloride 99 - 108 104     104  105      CO2 13 - 22 30     29   32      Calcium 8.7 - 10.7 9.2     9.2  9.2      Alkaline Phos 25 - 125 89     72  62      AST 14 - 40 23     31  38      ALT  10 - 40 U/L 20     18  22          This result is from an external source.     No results found for: "CEA1", "CEA" / No results found for: "CEA1", "CEA" No results found for: "PSA1" No results found for: " " No results found for: "CAN125"  No results found for: "TOTALPROTELP", "ALBUMINELP", "A1GS", "A2GS", "BETS", "BETA2SER", "GAMS", "MSPIKE", "SPEI" Lab Results  Component Value Date   TIBC 275 01/28/2020   TIBC 185 (L) 05/01/2018   TIBC 289 10/09/2017   FERRITIN 75 01/28/2020   FERRITIN 245 05/01/2018   FERRITIN 182 10/09/2017   IRONPCTSAT 17 (L) 01/28/2020   IRONPCTSAT 23 05/01/2018   IRONPCTSAT 80 10/09/2017   Lab Results  Component Value Date   LDH 188 10/09/2017   LDH 183 08/15/2017   LDH 210 07/04/2017    STUDIES:  No results found.    HISTORY:   Past Medical History:  Diagnosis Date   Anxiety    Bronchiolitis Feb. 2016   DVT (deep venous thrombosis) (HCC)    GERD (gastroesophageal reflux disease)    Hyperlipidemia    Peripheral vascular disease (HCC)    Pneumonia Feb. 2016   PVD (peripheral vascular disease) (HCC) 01/09/2015   Right iliac stent 2007    Past Surgical History:  Procedure Laterality Date   CHOLECYSTECTOMY  10/2015   HAND SURGERY     IVC FILTER PLACEMENT ([facility] HX)  2016    ROTATOR CUFF REPAIR     THROMBOLYSIS Right Nov. 9, 2010   Lower Extrim.   TONSILLECTOMY      Family History  Problem Relation Age of Onset   Deep vein thrombosis Father    Heart disease Father 52       Before age 25   Hyperlipidemia Father    Heart attack Father    Stroke Father     Social History:  reports that he has been smoking cigarettes. He has a 30.00 pack-year smoking history. He has never used smokeless tobacco. He reports that he does not currently use alcohol. He reports that he does not use drugs.The patient is alone today.  Allergies: No Known Allergies  Current Medications: Current Outpatient Medications  Medication Sig Dispense Refill    albuterol (VENTOLIN HFA) 108 (90 Base) MCG/ACT inhaler Inhale 2 puffs into the lungs every 4 (four) hours as needed.     atorvastatin (LIPITOR) 80 MG tablet Take by mouth.     buPROPion (WELLBUTRIN SR) 100 MG 12 hr tablet Take 100 mg by mouth every morning.     enoxaparin (LOVENOX) 120 MG/0.8ML injection Inject into the skin.     EPINEPHrine (EPIPEN 2-PAK) 0.3 mg/0.3 mL IJ SOAJ injection 0.3 mg once as needed.  escitalopram (LEXAPRO) 10 MG tablet TAKE ONE TABLET BY MOUTH EVERY EVENING 90 tablet 2   fluticasone (FLONASE) 50 MCG/ACT nasal spray Place 1 spray into both nostrils daily.     folic acid (FOLVITE) 1 MG tablet Take by mouth.     ipratropium-albuterol (DUONEB) 0.5-2.5 (3) MG/3ML SOLN Inhale into the lungs.     magnesium oxide (MAG-OX) 400 MG tablet Take by mouth.     mirtazapine (REMERON) 45 MG tablet SMARTSIG:1 Tablet(s) By Mouth Every Evening     montelukast (SINGULAIR) 10 MG tablet SMARTSIG:1 Tablet(s) By Mouth Every Evening     omeprazole (PRILOSEC) 40 MG capsule Take by mouth.     tamsulosin (FLOMAX) 0.4 MG CAPS capsule TAKE ONE CAPSULE BY MOUTH EVERY EVENING 30 capsule 2   thiamine (VITAMIN B-1) 100 MG tablet Take 100 mg by mouth daily.     No current facility-administered medications for this visit.

## 2021-08-10 ENCOUNTER — Encounter: Payer: Self-pay | Admitting: Hematology and Oncology

## 2021-08-10 ENCOUNTER — Inpatient Hospital Stay (INDEPENDENT_AMBULATORY_CARE_PROVIDER_SITE_OTHER): Payer: BC Managed Care – PPO | Admitting: Hematology and Oncology

## 2021-08-10 ENCOUNTER — Telehealth: Payer: Self-pay | Admitting: Oncology

## 2021-08-10 ENCOUNTER — Inpatient Hospital Stay: Payer: BC Managed Care – PPO | Attending: Hematology and Oncology

## 2021-08-10 DIAGNOSIS — I82531 Chronic embolism and thrombosis of right popliteal vein: Secondary | ICD-10-CM

## 2021-08-10 DIAGNOSIS — I82401 Acute embolism and thrombosis of unspecified deep veins of right lower extremity: Secondary | ICD-10-CM

## 2021-08-10 NOTE — Telephone Encounter (Signed)
Per 08/10/21 los next appt scheduled and confirmed with patient 

## 2021-08-11 ENCOUNTER — Encounter: Payer: Self-pay | Admitting: Family

## 2021-08-11 ENCOUNTER — Other Ambulatory Visit: Payer: Self-pay | Admitting: Oncology

## 2021-08-11 ENCOUNTER — Encounter: Payer: Self-pay | Admitting: Hematology and Oncology

## 2021-08-11 MED ORDER — FONDAPARINUX SODIUM 7.5 MG/0.6ML ~~LOC~~ SOLN
7.5000 mg | SUBCUTANEOUS | 5 refills | Status: DC
Start: 2021-08-11 — End: 2022-05-18

## 2021-09-04 ENCOUNTER — Other Ambulatory Visit: Payer: Self-pay | Admitting: Hematology & Oncology

## 2021-09-04 DIAGNOSIS — I82413 Acute embolism and thrombosis of femoral vein, bilateral: Secondary | ICD-10-CM

## 2021-09-04 DIAGNOSIS — I824Z2 Acute embolism and thrombosis of unspecified deep veins of left distal lower extremity: Secondary | ICD-10-CM

## 2021-09-06 ENCOUNTER — Encounter: Payer: Self-pay | Admitting: Family

## 2021-12-03 ENCOUNTER — Other Ambulatory Visit: Payer: Self-pay | Admitting: Hematology & Oncology

## 2021-12-03 DIAGNOSIS — I824Z2 Acute embolism and thrombosis of unspecified deep veins of left distal lower extremity: Secondary | ICD-10-CM

## 2021-12-18 ENCOUNTER — Other Ambulatory Visit: Payer: Self-pay | Admitting: Hematology & Oncology

## 2021-12-18 DIAGNOSIS — I824Z2 Acute embolism and thrombosis of unspecified deep veins of left distal lower extremity: Secondary | ICD-10-CM

## 2021-12-18 DIAGNOSIS — F419 Anxiety disorder, unspecified: Secondary | ICD-10-CM

## 2021-12-18 DIAGNOSIS — I82413 Acute embolism and thrombosis of femoral vein, bilateral: Secondary | ICD-10-CM

## 2021-12-20 DIAGNOSIS — G629 Polyneuropathy, unspecified: Secondary | ICD-10-CM | POA: Insufficient documentation

## 2021-12-20 HISTORY — DX: Polyneuropathy, unspecified: G62.9

## 2022-02-04 ENCOUNTER — Other Ambulatory Visit: Payer: Self-pay | Admitting: Hematology and Oncology

## 2022-02-04 DIAGNOSIS — I82531 Chronic embolism and thrombosis of right popliteal vein: Secondary | ICD-10-CM

## 2022-02-04 DIAGNOSIS — D510 Vitamin B12 deficiency anemia due to intrinsic factor deficiency: Secondary | ICD-10-CM

## 2022-02-07 NOTE — Progress Notes (Signed)
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    Previous evaluation for hypercoagulable disorder was apparently negative.  We saw him when he was hospitalized in August 2016, at which time we checked a JAK2 mutation for possible polycythemia vera, which was negative.  He then followed with Dr. [name] as an outpatient.  He has since had multiple episodes of right lower extremity deep venous thrombosis.    He was seen in the emergency room in early January 2022 and found to have nonocclusive thrombus extending above the level of the inferior vena cava filter despite Arixtra 7.5 mg subcu daily, however, there was questionable compliance with the Arixtra.  He was then placed on enoxaparin 1.66m/kg once daily.  He was admitted again in later in January 2022 with severe right lower extremity pain and inability to ambulate.  He was found to have recurrent extensive deep venous thrombosis throughout the right lower extremity with occlusive thrombus in the contralateral left common femoral vein.  Apparently, he did not continue enoxaparin.  He was discharged home on enoxaparin 1 mg/kg twice daily.  He was switched to enoxaparin 1.5 mg daily in July 2022.   He has had chronic swelling and pain of his right leg. He remains without evidence of recurrent DVT.  Due to pain from the enoxaparin injections, he wanted to switch back to fondaparinux 7.5 mg subcutaneously daily, which he felt were less painful.  He continues fondaparinux daily without difficulty.  We will plan to see him back in 6 months for repeat clinical assessment.  Tobacco abuse The patient continues to smoke 1 pack per day of cigarettes.  He states he has done so for at  least 30 years so he has a 30-pack-year history of smoking.  He has never had low-dose CT chest for lung cancer screening.  CTA chest in the emergency room and July 2019 revealed changes consistent with bronchial inflammation/bronchitis.  There were prominent mediastinal nodes measuring 1 cm or less felt to be possibly reactive.  I discussed low-dose CT chest for lung cancer screening with the patient.  He understands the risks and benefits of the procedure and agrees to proceed and continue annually.  He is well enough to undergo biopsy if needed.  He does not have any symptoms of lung cancer at this time.  He understands the importance of complete abstinence from tobacco.  He is attempted to quit on multiple occasions and is willing to do so again.  I will plan to contact him with the results of his testing.   The patient understands the plans discussed today and is in agreement with them.  He knows to contact our office if he develops concerns prior to his next appointment.   I provided 20 minutes of face-to-face time during this encounter and > 50% was spent counseling as documented under my assessment and plan.    KMarvia Pickles PA-C  CFort [facility]iance Indian HospitalAT AT[facility] ([facility])[address]Dept: [phone] Fax: [phone]  No orders of the defined types were placed in this encounter.     CHIEF COMPLAINT:  CC: Multiple recurrences of deep venous thrombosis of the right lower  extremity  Current Treatment: Fondaparinux 7.5 mg daily  HISTORY OF PRESENT ILLNESS:  Aaron Cunningham is a 57 year old male with a history of recurrent deep venous thrombosis, as well as an episode of pulmonary emboli.  He has an IVC filter in place.  His 1st episode of right lower extremity DVT was in May 2016.  All of his recurrent DVTs have been in the right lower extremity.  Complete evaluation in May 2016 did not reveal any evidence  of a hypercoagulable state.  He also had polycythemia and JAK2 mutation was negative.  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  We saw him in the hospital in August 2016, but then he continued to follow-up with Dr. [name] as an outpatient. He had a single mutation of MTHFR, which is not associated with increased risk of thrombosis.  He has a history of peripheral vascular disease and had a right iliac stent placed in 2007.  He was admitted in January 2022 for recurrent right lower extremity DVT, but with questionable compliance with Arixtra.  He was found to have worsening deep venous thrombosis of the entire right lower extremity, as well as the left with thrombosis of the inferior neck cava filter, with thrombus extending above the filter to the level of the renal vein.  He was having severe pain with 3 to 4+ edema of the right lower extremity, and was unable to stand or ambulate.  He was admitted for IV heparin and was then switched to enoxaparin 1 mg/kg twice daily.  We began seeing him again in January 2022 during the hospital admission and have been following him since.  He was discharged on enoxaparin 1 mg/kg twice daily.  We switched him to enoxaparin 1.5 mg/kg daily in July 2022.  He has not had recurrent DVTs since then.  He was seen off the schedule in May due to leg cramping of uncertain etiology.  He has had chronic right calf swelling and pain.  At his request, he was switched to fondaparinux 7.5 mg subcutaneously daily in July.   INTERVAL HISTORY:  Aaron Cunningham is here today for repeat clinical assessment.  He states he continues fondaparinux daily without significant difficulty.  He denies abnormal bleeding or bruising. He reports worsening right leg pain. I see he had a CT angio of the abdominal aorta with iliofemoral runoff in June. 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  There were developing varicose veins  of the bilateral lower extremities compatible with chronic venous insufficiency.   He denies fevers or chills.  He denies ear pain or scalp lesions.  He denies pain. His appetite is good. His weight has increased 1 pounds over last 6 months .  He continues to smoke cigarettes about 1 pack per day.  He states he has been smoking for at least 30 years, so has a 30 pack year history of smoking.  REVIEW OF SYSTEMS:  Review of Systems  Constitutional:  Negative for appetite change, chills, fatigue, fever and unexpected weight change.  HENT:   Negative for lump/mass, mouth sores and sore throat.   Respiratory:  Negative for cough and shortness of breath.   Cardiovascular:  Negative for chest pain and leg swelling.  Gastrointestinal:  Negative for abdominal pain, constipation, diarrhea, nausea and vomiting.  Genitourinary:  Negative for difficulty urinating, dysuria, frequency and hematuria.   Musculoskeletal:  Positive for myalgias. Negative for arthralgias and back pain.  Skin:  Negative for itching, rash and wound.  Neurological:  Negative for dizziness, extremity weakness, headaches, light-headedness and numbness.  Hematological:  Negative for adenopathy.  Psychiatric/Behavioral:  Negative for depression and sleep disturbance. The patient is not nervous/anxious.      VITALS:  Blood pressure (!) 158/83, pulse 71, temperature 98.2 F (36.8 C), temperature source Oral, resp. rate 20, height 5' 9"$  (1.753 m), weight 191 lb 3.2 oz (86.7 kg), SpO2 100 %.  Wt Readings from Last 3 Encounters:  02/10/22 191 lb 3.2 oz (86.7 kg)  08/10/21 190 lb (86.2 kg)  06/03/21 179 lb 11.2 oz (81.5 kg)    Body mass index is 28.24 kg/m.  Performance status (ECOG): 1 - Symptomatic but completely ambulatory  PHYSICAL EXAM:  Physical Exam Vitals and nursing note reviewed.  Constitutional:      General: He is not in acute distress.    Appearance: Normal appearance. He is normal weight.  HENT:     Head:  Normocephalic and atraumatic.     Right Ear: Tympanic membrane and ear canal normal.     Left Ear: Tympanic membrane and ear canal normal.     Mouth/Throat:     Mouth: Mucous membranes are moist.     Pharynx: Oropharynx is clear. No oropharyngeal exudate or posterior oropharyngeal erythema.  Eyes:     General: No scleral icterus.    Extraocular Movements: Extraocular movements intact.     Conjunctiva/sclera: Conjunctivae normal.     Pupils: Pupils are equal, round, and reactive to light.  Cardiovascular:     Rate and Rhythm: Normal rate and regular rhythm.     Heart sounds: Normal heart sounds. No murmur heard.    No friction rub. No gallop.  Pulmonary:     Effort: Pulmonary effort is normal.     Breath sounds: Normal breath sounds. No wheezing, rhonchi or rales.  Abdominal:     General: Bowel sounds are normal. There is no distension.     Palpations: Abdomen is soft. There is no hepatomegaly, splenomegaly or mass.     Tenderness: There is no abdominal tenderness.  Musculoskeletal:        General: Normal range of motion.     Cervical back: Normal range of motion and neck supple. No tenderness.     Right lower leg: No tenderness. Edema (trace) present.     Left lower leg: No edema.     Comments: Trace edema of the right lower extremity with chronic stasis changes of the skin.  There is no calf tenderness.  Bevelyn Buckles is negative.  Lymphadenopathy:     Head:     Right side of head: Posterior auricular (Subcentimeter right posterior auricular lymph node) adenopathy present.     Cervical: No cervical adenopathy.     Upper Body:     Right upper body: No supraclavicular or axillary adenopathy.     Left upper body: No supraclavicular or axillary adenopathy.     Lower Body: No right inguinal adenopathy. No left inguinal adenopathy.  Skin:    General: Skin is warm and dry.     Coloration: Skin is not jaundiced.     Findings: No rash.  Neurological:     Mental Status: He is alert and  oriented to person, place, and time.     Cranial Nerves: No cranial nerve deficit.  Psychiatric:        Mood and Affect: Mood normal.        Behavior: Behavior normal.  Thought Content: Thought content normal.     LABS:      Latest Ref Rng & Units 02/10/2022    9:24 AM 06/03/2021   12:00 AM 02/10/2021   12:00 AM  CBC  WBC 4.0 - 10.5 K/uL 5.8  8.2     6.6   Hemoglobin 13.0 - 17.0 g/dL 13.6  15.1     15.4   Hematocrit 39.0 - 52.0 % 41.8  45     46   Platelets 150 - 400 K/uL 156  192     198      This result is from an external source.      Latest Ref Rng & Units 06/03/2021   12:00 AM 02/10/2021   12:00 AM 09/25/2020   12:00 AM  CMP  BUN 4 - 21 18     15  12      $ Creatinine 0.6 - 1.3 1.3     1.2  1.1      Sodium 137 - 147 144     142  142      Potassium 3.5 - 5.1 mEq/L 3.6     4.2  3.9      Chloride 99 - 108 104     104  105      CO2 13 - 22 30     29  $ 32      Calcium 8.7 - 10.7 9.2     9.2  9.2      Alkaline Phos 25 - 125 89     72  62      AST 14 - 40 23     31  38      ALT 10 - 40 U/L 20     18  22         $ This result is from an external source.     No results found for: "CEA1", "CEA" / No results found for: "CEA1", "CEA" No results found for: "PSA1" No results found for: "[id number]" No results found for: "CAN125"  No results found for: "TOTALPROTELP", "ALBUMINELP", "A1GS", "A2GS", "BETS", "BETA2SER", "GAMS", "MSPIKE", "SPEI" Lab Results  Component Value Date   TIBC 275 01/28/2020   TIBC 185 (L) 05/01/2018   TIBC 289 10/09/2017   FERRITIN 75 01/28/2020   FERRITIN 245 05/01/2018   FERRITIN 182 10/09/2017   IRONPCTSAT 17 (L) 01/28/2020   IRONPCTSAT 23 05/01/2018   IRONPCTSAT 80 10/09/2017   Lab Results  Component Value Date   LDH 188 10/09/2017   LDH 183 08/15/2017   LDH 210 07/04/2017    STUDIES:  No results found.    HISTORY:   Past Medical History:  Diagnosis Date   Anxiety    Bronchiolitis Feb. 2016   DVT (deep venous thrombosis) (HCC)     GERD (gastroesophageal reflux disease)    Hyperlipidemia    Peripheral vascular disease ([place])    Pneumonia Feb. 2016   PVD (peripheral vascular disease) ([place]) 01/09/2015   Right iliac stent 2007    Past Surgical History:  Procedure Laterality Date   CHOLECYSTECTOMY  10/2015   HAND SURGERY     IVC FILTER PLACEMENT ([place] [facility])  2016    ROTATOR CUFF REPAIR     THROMBOLYSIS Right Nov. 9, 2010   Lower Extrim.   TONSILLECTOMY      Family History  Problem Relation Age of Onset   Deep vein thrombosis Father    Heart disease Father 66  Before age 69   Hyperlipidemia Father    Heart attack Father    Stroke Father     Social History:  reports that he has been smoking cigarettes. He has a 30.00 pack-year smoking history. He has never used smokeless tobacco. He reports that he does not currently use alcohol. He reports that he does not use drugs.The patient is alone today.  Allergies: No Known Allergies  Current Medications: Current Outpatient Medications  Medication Sig Dispense Refill   albuterol (VENTOLIN HFA) 108 (90 Base) MCG/ACT inhaler Inhale 2 puffs into the lungs every 4 (four) hours as needed.     atorvastatin (LIPITOR) 80 MG tablet Take by mouth.     buPROPion (WELLBUTRIN SR) 100 MG 12 hr tablet Take 100 mg by mouth every morning.     EPINEPHrine (EPIPEN 2-PAK) 0.3 mg/0.3 mL IJ SOAJ injection 0.3 mg once as needed.     escitalopram (LEXAPRO) 10 MG tablet TAKE ONE TABLET BY MOUTH EVERY EVENING 90 tablet 2   ezetimibe (ZETIA) 10 MG tablet Take 10 mg by mouth daily.     fluticasone (FLONASE) 50 MCG/ACT nasal spray Place 1 spray into both nostrils daily. (Patient not taking: Reported on [id number])     folic acid (FOLVITE) 1 MG tablet TAKE ONE TABLET BY MOUTH EVERY EVENING 90 tablet 6   fondaparinux (ARIXTRA) 7.5 MG/0.6ML SOLN injection Inject 0.6 mLs (7.5 mg total) into the skin daily. 30 mL 5   ipratropium-albuterol (DUONEB) 0.5-2.5 (3) MG/3ML SOLN Inhale into the lungs.      magnesium oxide (MAG-OX) 400 MG tablet Take by mouth.     mirtazapine (REMERON) 45 MG tablet SMARTSIG:1 Tablet(s) By Mouth Every Evening (Patient not taking: Reported on 02/10/2022)     montelukast (SINGULAIR) 10 MG tablet SMARTSIG:1 Tablet(s) By Mouth Every Evening     omeprazole (PRILOSEC) 40 MG capsule Take by mouth.     tamsulosin (FLOMAX) 0.4 MG CAPS capsule TAKE ONE CAPSULE BY MOUTH EVERY EVENING 30 capsule 2   thiamine (VITAMIN B-1) 100 MG tablet Take 100 mg by mouth daily.     No current facility-administered medications for this visit.

## 2022-02-07 NOTE — Assessment & Plan Note (Addendum)
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    Previous evaluation for hypercoagulable disorder was apparently negative.  We saw him when he was hospitalized in August 2016, at which time we checked a JAK2 mutation for possible polycythemia vera, which was negative.  He then followed with Dr. [name] as an outpatient.  He has since had multiple episodes of right lower extremity deep venous thrombosis.    He was seen in the emergency room in early January 2022 and found to have nonocclusive thrombus extending above the level of the inferior vena cava filter despite Arixtra 7.5 mg subcu daily, however, there was questionable compliance with the Arixtra.  He was then placed on enoxaparin 1.5mg /kg once daily.  He was admitted again in later in January 2022 with severe right lower extremity pain and inability to ambulate.  He was found to have recurrent extensive deep venous thrombosis throughout the right lower extremity with occlusive thrombus in the contralateral left common femoral vein.  Apparently, he did not continue enoxaparin.  He was discharged home on enoxaparin 1 mg/kg twice daily.  He was switched to enoxaparin 1.5 mg daily in July 2022.   He has had chronic swelling and pain of his right leg. He remains without evidence of recurrent DVT.  Due to pain from the enoxaparin injections, he wanted to switch back to fondaparinux 7.5 mg subcutaneously daily, which he felt were less painful.  He continues fondaparinux daily without difficulty.  We will plan to see him back in 6 months for repeat clinical assessment.

## 2022-02-10 ENCOUNTER — Inpatient Hospital Stay: Payer: BC Managed Care – PPO | Attending: Oncology | Admitting: Hematology and Oncology

## 2022-02-10 ENCOUNTER — Inpatient Hospital Stay: Payer: BC Managed Care – PPO

## 2022-02-10 ENCOUNTER — Encounter: Payer: Self-pay | Admitting: Hematology and Oncology

## 2022-02-10 ENCOUNTER — Telehealth: Payer: Self-pay | Admitting: Hematology and Oncology

## 2022-02-10 VITALS — BP 158/83 | HR 71 | Temp 98.2°F | Resp 20 | Ht 69.0 in | Wt 191.2 lb

## 2022-02-10 DIAGNOSIS — I82531 Chronic embolism and thrombosis of right popliteal vein: Secondary | ICD-10-CM

## 2022-02-10 DIAGNOSIS — Z720 Tobacco use: Secondary | ICD-10-CM | POA: Diagnosis not present

## 2022-02-10 DIAGNOSIS — Z7901 Long term (current) use of anticoagulants: Secondary | ICD-10-CM | POA: Insufficient documentation

## 2022-02-10 DIAGNOSIS — Z86718 Personal history of other venous thrombosis and embolism: Secondary | ICD-10-CM | POA: Diagnosis present

## 2022-02-10 DIAGNOSIS — F17210 Nicotine dependence, cigarettes, uncomplicated: Secondary | ICD-10-CM | POA: Insufficient documentation

## 2022-02-10 DIAGNOSIS — I825Z1 Chronic embolism and thrombosis of unspecified deep veins of right distal lower extremity: Secondary | ICD-10-CM

## 2022-02-10 DIAGNOSIS — D510 Vitamin B12 deficiency anemia due to intrinsic factor deficiency: Secondary | ICD-10-CM

## 2022-02-10 LAB — CBC WITH DIFFERENTIAL (CANCER CENTER ONLY)
Abs Immature Granulocytes: 0.02 10*3/uL (ref 0.00–0.07)
Basophils Absolute: 0 10*3/uL (ref 0.0–0.1)
Basophils Relative: 0 %
Eosinophils Absolute: 0.2 10*3/uL (ref 0.0–0.5)
Eosinophils Relative: 3 %
HCT: 41.8 % (ref 39.0–52.0)
Hemoglobin: 13.6 g/dL (ref 13.0–17.0)
Immature Granulocytes: 0 %
Lymphocytes Relative: 34 %
Lymphs Abs: 2 10*3/uL (ref 0.7–4.0)
MCH: 31.6 pg (ref 26.0–34.0)
MCHC: 32.5 g/dL (ref 30.0–36.0)
MCV: 97.2 fL (ref 80.0–100.0)
Monocytes Absolute: 0.4 10*3/uL (ref 0.1–1.0)
Monocytes Relative: 7 %
Neutro Abs: 3.2 10*3/uL (ref 1.7–7.7)
Neutrophils Relative %: 56 %
Platelet Count: 156 10*3/uL (ref 150–400)
RBC: 4.3 MIL/uL (ref 4.22–5.81)
RDW: 13.2 % (ref 11.5–15.5)
WBC Count: 5.8 10*3/uL (ref 4.0–10.5)
nRBC: 0 % (ref 0.0–0.2)

## 2022-02-10 LAB — VITAMIN B12: Vitamin B-12: 247 pg/mL (ref 180–914)

## 2022-02-10 NOTE — Telephone Encounter (Signed)
02/10/22 Next appt scheduled and confirmed with patient 

## 2022-02-11 ENCOUNTER — Encounter: Payer: Self-pay | Admitting: Hematology and Oncology

## 2022-02-11 ENCOUNTER — Encounter: Payer: Self-pay | Admitting: Family

## 2022-02-11 NOTE — Assessment & Plan Note (Addendum)
The patient continues to smoke 1 pack per day of cigarettes.  He states he has done so for at least 30 years so he has a 30-pack-year history of smoking.  He has never had low-dose CT chest for lung cancer screening.  CTA chest in the emergency room and July 2019 revealed changes consistent with bronchial inflammation/bronchitis.  There were prominent mediastinal nodes measuring 1 cm or less felt to be possibly reactive.  I discussed low-dose CT chest for lung cancer screening with the patient.  He understands the risks and benefits of the procedure and agrees to proceed and continue annually.  He is well enough to undergo biopsy if needed.  He does not have any symptoms of lung cancer at this time.  He understands the importance of complete abstinence from tobacco.  He is attempted to quit on multiple occasions and is willing to do so again.  I will plan to contact him with the results of his testing.

## 2022-02-23 ENCOUNTER — Encounter: Payer: Self-pay | Admitting: Hematology and Oncology

## 2022-02-25 ENCOUNTER — Telehealth: Payer: Self-pay | Admitting: Hematology and Oncology

## 2022-02-25 ENCOUNTER — Telehealth: Payer: Self-pay

## 2022-02-25 DIAGNOSIS — I2510 Atherosclerotic heart disease of native coronary artery without angina pectoris: Secondary | ICD-10-CM

## 2022-02-25 HISTORY — DX: Atherosclerotic heart disease of native coronary artery without angina pectoris: I25.10

## 2022-02-25 NOTE — Telephone Encounter (Signed)
Spoke with patient regarding CT lung cancer screening. No pulmonary nodules or other suspicious lesions were seen.  There was mild central lobar emphysema with mild linear opacities in the right lower lobe unchanged from previously felt to be secondary to scarring.  However, there was severe three-vessel coronary artery calcifications.  The patient is on medication for cholesterol.  He has seen Dr. Agustin Cree in the remote past.  He denies chest pain or shortness of breath.  He follow-ups regularly with Dr. Garlon Hatchet.  I we will forward the CT scan to Dr. Garlon Hatchet.  The patient knows to seek immediate medical attention if he were to have sudden shortness of breath or chest pain.  He will have a repeat low-dose CT chest for lung cancer screening in 1 year.  The patient expresses understanding.

## 2022-02-25 NOTE — Telephone Encounter (Signed)
CT of chest faxed to Dr Yvette Rack office.

## 2022-04-01 ENCOUNTER — Other Ambulatory Visit: Payer: Self-pay | Admitting: Hematology & Oncology

## 2022-04-01 DIAGNOSIS — I824Z2 Acute embolism and thrombosis of unspecified deep veins of left distal lower extremity: Secondary | ICD-10-CM

## 2022-04-01 DIAGNOSIS — I82413 Acute embolism and thrombosis of femoral vein, bilateral: Secondary | ICD-10-CM

## 2022-05-18 ENCOUNTER — Other Ambulatory Visit: Payer: Self-pay | Admitting: Hematology and Oncology

## 2022-05-18 DIAGNOSIS — Z86718 Personal history of other venous thrombosis and embolism: Secondary | ICD-10-CM

## 2022-05-18 DIAGNOSIS — Z7901 Long term (current) use of anticoagulants: Secondary | ICD-10-CM

## 2022-05-18 DIAGNOSIS — Z86711 Personal history of pulmonary embolism: Secondary | ICD-10-CM

## 2022-06-15 ENCOUNTER — Other Ambulatory Visit: Payer: Self-pay | Admitting: Hematology & Oncology

## 2022-06-15 DIAGNOSIS — I824Z2 Acute embolism and thrombosis of unspecified deep veins of left distal lower extremity: Secondary | ICD-10-CM

## 2022-06-15 DIAGNOSIS — I82413 Acute embolism and thrombosis of femoral vein, bilateral: Secondary | ICD-10-CM

## 2022-07-29 ENCOUNTER — Other Ambulatory Visit: Payer: Self-pay | Admitting: Hematology & Oncology

## 2022-07-29 DIAGNOSIS — F419 Anxiety disorder, unspecified: Secondary | ICD-10-CM

## 2022-08-04 NOTE — Progress Notes (Signed)
[facility]  [address] [place],  [place]  [address] [phone]  Clinic Day:  08/11/22   Referring physician: Olive Bass, MD  ASSESSMENT & PLAN:   Assessment & Plan: 1.  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  He has been seen by Dr. Arlan Organ in the past, and was negative for polycythemia vera.  I have agreed to be his local hematologist since he resides in [place].   He was changed from Lovenox at 120 mg (1 mg/kg) to Abrixta injections 7.5mg  (total) daily and is tolerating this well.      2.  Episode of pulmonary emboli.   3. Extensive thrombosis of the right lower extremity throughout the leg and occlusive thrombus in the left common femoral vein as well.  This has been debilitating and painful.  He was given Tramadol without much relief, and so has been using Tylenol.  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  He has clot in the inferior vena cava and surrounding his IVC filter so is at extremely high risk for complications.   4. Tobacco abuse.  He has smoked 1 pack per day for at least 30 years.   5. Alcohol abuse.  He has stopped drinking alcohol since January 2022.   6. Hypertension, improved. He will follow-up with Dr. Sol Passer.  7. Peripheral Neuropathy. This is part of his chronic pain and may be related to his prior alcohol abuse. I will place him on a trail of Gabapentin.   8. Low-normal B-12. I will check a level today after he has been off oral supplement.   Plan: He has now quit drinking alcohol and feels much better, but is still smoking.  He informed me that he had a hearing last Thursday for his disability status and is waiting on the results, he has been trying to get disability for the past 2 years. He continues his Arixtra injections 7.5mg  daily and inquired if he can be  off of his blood thinner 5 days before a dental procedure. I advised against stopping it 5 days prior but he can stop it 2 days before his appointment considering that he is high risk for developing a blood clot. His labs today are pending and I will add a B-12 level to that. His B-12 levels have been low normal for the last 2 years. He informed me that he was taking an oral B-12 supplement previously but has stopped it for a while. Patient inquired about taking Gabapentin for his chronic leg pain and I agreed that it would be a good medication to use. I will prescribe Gabapentin 300mg  1 at bedtime and he can slowly escalate to 3 daily.  I will see him back in 6 months with CBC, CMP, and B-12.    He understands and agrees with this plan of care.  I have answered his questions and he knows to call with any concerns.  I provided 20 minutes of face-to-face time during this this encounter and > 50% was spent counseling as documented under my assessment and plan.     Dellia Beckwith, MD  [facility] [address] [place] [address] Dept: [phone] Dept Fax: [phone]   No orders of the defined types were placed in this encounter.    CHIEF COMPLAINT:  CC: History of DVT  Current Treatment:  Arixtra   HISTORY OF PRESENT ILLNESS:  History of recurrent deep venous thrombosis and episode of pulmonary emboli who has an IVC filter in place.  His 1st episode was in December of 2016 and it has always been the right lower extremity.  He was admitted earlier in January 2022 for recurrent DVT but we are not sure about his compliance with the Arixtra.  He presented with worsening deep venous thrombosis of the entire right lower extremity as well as the left with thrombosis of the inferior neck cava filter, with thrombus extending above the filter to the level of the renal vein.  He was having severe pain and 3 to 4+ edema of the right  lower extremity, and was unable to stand or ambulate.  He was admitted for IV heparin and was then switched to Lovenox at 1 milligram/kilogram twice daily rather than 1.5 milligrams/kilogram once daily.  The Doppler confirmed extensive deep venous thrombosis throughout the right lower extremity which is extensive and occlusive.  He has seen a vascular surgeon in the past and has had a stent placed as well as the IVC filter at [facility].  A vascular surgeon was contacted when the patient was in the emergency room and did not have any specific recommendations at this time.  He has seen vascular surgeons in the past.  I ordered oxycodone for the patient's pain and it does give him some relief.  He continues to smoke and was drinking alcohol heavily previously.  At some point, he should probably have removal of the IVC filter, but I feel it would be too risky at this time. He was discharged on January 29th.  His daughter has undergone genetic testing and does have a MTHFR, homozygous mutation.  I went through his records from Dr. Arlan Organ, and did not see that he was checked for hypercoagulable state, but he was negative for JAK2 mutation (polycythemia vera).  Factor IV Leiden was negative in March.  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  However, he was already on anticoagulant therapy at that time.  We will monitor this periodically.  He also has problems with hypertension and did have a COVID infection in February.   INTERVAL HISTORY:  Aaron Cunningham is here for routine follow up for his history of DVT. Patient states that he feels well and complains of chronic right leg pain rating a 3/10. He has now quit drinking alcohol and feels much better, but is still smoking.  He informed me that he had a hearing last Thursday for his disability status and is waiting on the results, he has been trying to get disability for the past 2 years. He continues his Arixtra  injections 7.5mg  and inquired if he can be off of his blood thinner 5 days before a dental procedure. I advised against stopping it 5 days prior but he can stop it 2 days before his appointment considering that he is high risk for developing a blood clot. His labs today are pending and I will add a B-12 level to that. His B-12 levels have been low normal for the last 2 years. He informed me that he was taking an oral B-12 supplement previously but has stopped it for a while. Patient inquired about taking Gabapentin for his chronic leg pain and I agreed that it would be a good medication to use. I will prescribe Gabapentin 300mg  1 at bedtime  and he can slowly escalate to 3 daily.  I will see him back in 6 months with CBC, CMP, and B-12. He denies signs of infection such as sore throat, sinus drainage, cough, or urinary symptoms.  He denies fevers or recurrent chills. He denies nausea, vomiting, chest pain, dyspnea or cough. His appetite is good and his weight has decreased 9 pounds over last 6 months .  REVIEW OF SYSTEMS:  Review of Systems  Constitutional: Negative.  Negative for appetite change, chills, diaphoresis, fatigue, fever and unexpected weight change.  HENT:  Negative.  Negative for hearing loss, lump/mass, mouth sores, nosebleeds, sore throat, tinnitus, trouble swallowing and voice change.   Eyes: Negative.  Negative for eye problems and icterus.  Respiratory: Negative.  Negative for chest tightness, cough, hemoptysis, shortness of breath and wheezing.   Cardiovascular:  Positive for leg swelling (right, chronic). Negative for chest pain and palpitations.  Gastrointestinal: Negative.  Negative for abdominal distention, abdominal pain, blood in stool, constipation, diarrhea, nausea, rectal pain and vomiting.  Endocrine: Negative.  Negative for hot flashes.  Genitourinary: Negative.  Negative for bladder incontinence, difficulty urinating, dyspareunia, dysuria, frequency, hematuria, nocturia,  pelvic pain and penile discharge.   Musculoskeletal:  Positive for arthralgias (right knee, chronic). Negative for back pain, flank pain, gait problem, myalgias, neck pain and neck stiffness.  Skin: Negative.  Negative for itching, rash and wound.  Neurological: Negative.  Negative for dizziness, extremity weakness, gait problem, headaches, light-headedness, numbness, seizures and speech difficulty.       Chronic peripheral neuropathy.   Hematological: Negative.  Negative for adenopathy. Does not bruise/bleed easily.  Psychiatric/Behavioral: Negative.  Negative for confusion, decreased concentration, depression, sleep disturbance and suicidal ideas. The patient is not nervous/anxious.   All other systems reviewed and are negative.    VITALS:  Blood pressure (!) 146/81, pulse (!) 58, temperature 97.9 F (36.6 C), temperature source Oral, resp. rate 16, height 5\' 8"  (1.727 m), weight 182 lb 4.8 oz (82.7 kg), SpO2 100%.  Wt Readings from Last 3 Encounters:  08/11/22 182 lb 4.8 oz (82.7 kg)  08/08/22 180 lb (81.6 kg)  02/10/22 191 lb 3.2 oz (86.7 kg)    Body mass index is 27.72 kg/m.  Performance status (ECOG): 1 - Symptomatic but completely ambulatory  PHYSICAL EXAM:  Physical Exam Vitals and nursing note reviewed.  Constitutional:      General: He is not in acute distress.    Appearance: Normal appearance. He is normal weight. He is not ill-appearing, toxic-appearing or diaphoretic.  HENT:     Head: Normocephalic and atraumatic.     Right Ear: Tympanic membrane, ear canal and external ear normal. There is no impacted cerumen.     Left Ear: Tympanic membrane, ear canal and external ear normal. There is no impacted cerumen.     Nose: Nose normal. No congestion or rhinorrhea.     Mouth/Throat:     Mouth: Mucous membranes are moist.     Pharynx: Oropharynx is clear. No oropharyngeal exudate or posterior oropharyngeal erythema.  Eyes:     General: No scleral icterus.       Right eye:  No discharge.        Left eye: No discharge.     Extraocular Movements: Extraocular movements intact.     Conjunctiva/sclera: Conjunctivae normal.     Pupils: Pupils are equal, round, and reactive to light.  Neck:     Vascular: No carotid bruit.  Cardiovascular:     Rate  and Rhythm: Regular rhythm. Bradycardia present.     Pulses: Normal pulses.     Heart sounds: Normal heart sounds. No murmur heard.    No friction rub. No gallop.  Pulmonary:     Effort: Pulmonary effort is normal. No respiratory distress.     Breath sounds: Normal breath sounds. No stridor. No wheezing, rhonchi or rales.  Chest:     Chest wall: No tenderness.  Abdominal:     General: Bowel sounds are normal. There is no distension.     Palpations: Abdomen is soft. There is no hepatomegaly, splenomegaly or mass.     Tenderness: There is no abdominal tenderness. There is no right CVA tenderness, left CVA tenderness, guarding or rebound.     Hernia: No hernia is present.  Musculoskeletal:        General: No swelling, tenderness, deformity or signs of injury. Normal range of motion.     Cervical back: Normal range of motion and neck supple. No rigidity or tenderness.     Right lower leg: 1+ Edema present.     Left lower leg: No edema.     Comments: Edema hyperpigmentation on the right leg with firmness of the right calf.   Lymphadenopathy:     Cervical: No cervical adenopathy.     Upper Body:     Right upper body: No supraclavicular or axillary adenopathy.     Left upper body: No supraclavicular or axillary adenopathy.     Lower Body: No right inguinal adenopathy. No left inguinal adenopathy.  Skin:    General: Skin is warm and dry.     Coloration: Skin is not jaundiced or pale.     Findings: No bruising, erythema, lesion or rash.  Neurological:     General: No focal deficit present.     Mental Status: He is alert and oriented to person, place, and time. Mental status is at baseline.     Cranial Nerves: No  cranial nerve deficit.     Sensory: No sensory deficit.     Motor: No weakness.     Coordination: Coordination normal.     Gait: Gait normal.     Deep Tendon Reflexes: Reflexes normal.  Psychiatric:        Mood and Affect: Mood normal.        Behavior: Behavior normal.        Thought Content: Thought content normal.        Judgment: Judgment normal.     LABS:   Component Ref Range & Units (02/10/22)  WBC Count 4.0 - 10.5 K/uL 5.8  RBC 4.22 - 5.81 MIL/uL 4.30  Hemoglobin 13.0 - 17.0 g/dL 13.0  HCT 86.5 - 78.4 % 41.8  MCV 80.0 - 100.0 fL 97.2  MCH 26.0 - 34.0 pg 31.6  MCHC 30.0 - 36.0 g/dL 69.6  RDW 29.5 - 28.4 % 13.2  Platelet Count 150 - 400 K/uL 156   Component Ref Range & Units 08/08/2022   Sodium 136 - 145 mmol/L 143  Potassium 3.5 - 5.1 mmol/L 5.0  Chloride 98 - 107 mmol/L 105  CO2 21 - 31 mmol/L 29  Anion Gap 6 - 14 mmol/L 9  Glucose, Random 70 - 99 mg/dL 132 High   Blood Urea Nitrogen (BUN) 7 - 25 mg/dL 15  Creatinine 4.40 - 1.02 mg/dL 7.25  eGFR >36 UY/QIH/4.74Q5 71  Albumin 3.5 - 5.7 g/dL 4.8  Total Protein 6.4 - 8.9 g/dL 6.7  Bilirubin, Total 0.3 - 1.0  mg/dL 0.7  Alkaline Phosphatase (ALP) 34 - 104 U/L 86  Aspartate Aminotransferase (AST) 13 - 39 U/L 26  Alanine Aminotransferase (ALT) 7 - 52 U/L 36  Calcium 8.6 - 10.3 mg/dL 9.2   Component Ref Range & Units 08/08/2022  PSA, Total 0.00 - 3.50 ng/mL 0.53   Component Ref Range & Units 08/08/2022  Cholesterol, Total, Lipid Panel <200 mg/dL 161  Triglycerides, Lipid Panel <150 mg/dL 096  HDL Cholesterol - Lipid Panel >=60 mg/dL 40 Low   LDL Cholesterol, Calculated <100 mg/dL 55  Non-HDL Cholesterol mg/dL 74   Component Ref Range & Units 6 mo ago (02/10/22) 1 yr ago (06/03/21) 1 yr ago (02/10/21)  Vitamin B-12 180 - 914 pg/mL 247 286 CM 299 CM      Latest Ref Rng & Units 08/11/2022    8:50 AM 02/10/2022    9:24 AM 06/03/2021   12:00 AM  CBC  WBC 4.0 - 10.5 K/uL 6.5   5.8  8.2      Hemoglobin 13.0 - 17.0 g/dL 04.5  40.9  81.1      Hematocrit 39.0 - 52.0 % 42.9  41.8  45      Platelets 150 - 400 K/uL 162  156  192         This result is from an external source.      Latest Ref Rng & Units 08/11/2022    8:50 AM 06/03/2021   12:00 AM 02/10/2021   12:00 AM  CMP  Glucose 70 - 99 mg/dL 914     BUN 6 - 20 mg/dL 15  18     15    Creatinine 0.61 - 1.24 mg/dL 7.82  1.3     1.2   Sodium 135 - 145 mmol/L 146  144     142   Potassium 3.5 - 5.1 mmol/L 5.0  3.6     4.2   Chloride 98 - 111 mmol/L 111  104     104   CO2 22 - 32 mmol/L 27  30     29    Calcium 8.9 - 10.3 mg/dL 8.8  9.2     9.2   Total Protein 6.5 - 8.1 g/dL 6.8     Total Bilirubin 0.3 - 1.2 mg/dL 0.8     Alkaline Phos 38 - 126 U/L 76  89     72   AST 15 - 41 U/L 30  23     31    ALT 0 - 44 U/L 41  20     18      This result is from an external source.    Lab Results  Component Value Date   TIBC 275 01/28/2020   TIBC 185 (L) 05/01/2018   TIBC 289 10/09/2017   FERRITIN 75 01/28/2020   FERRITIN 245 05/01/2018   FERRITIN 182 10/09/2017   IRONPCTSAT 17 (L) 01/28/2020   IRONPCTSAT 23 05/01/2018   IRONPCTSAT 80 10/09/2017   Lab Results  Component Value Date   LDH 188 10/09/2017   LDH 183 08/15/2017   LDH 210 07/04/2017    STUDIES:     HISTORY:   Allergies: No Known Allergies  Current Medications: Current Outpatient Medications  Medication Sig Dispense Refill   albuterol (VENTOLIN HFA) 108 (90 Base) MCG/ACT inhaler Inhale 2 puffs into the lungs every 4 (four) hours as needed.     atorvastatin (LIPITOR) 80 MG tablet Take 80 mg by mouth daily.     buPROPion [facility]  SR) 100 MG 12 hr tablet Take 100 mg by mouth every morning.     EPINEPHrine (EPIPEN 2-PAK) 0.3 mg/0.3 mL IJ SOAJ injection 0.3 mg once as needed.     escitalopram (LEXAPRO) 10 MG tablet TAKE ONE TABLET BY MOUTH EVERY EVENING 90 tablet 2   ezetimibe (ZETIA) 10 MG tablet Take 10 mg by mouth daily.     fluticasone  (FLONASE) 50 MCG/ACT nasal spray Place 1 spray into both nostrils daily.     folic acid (FOLVITE) 1 MG tablet TAKE ONE TABLET BY MOUTH EVERY EVENING 90 tablet 6   fondaparinux (ARIXTRA) 7.5 MG/0.6ML SOLN injection INJECT 0.6 MLS(7.5 MG TOTAL) INTO THE SKIN DAILY 30 mL 5   gabapentin (NEURONTIN) 300 MG capsule Take 1 capsule (300 mg total) by mouth 3 (three) times daily. Start with 1 by mouth at bedtime 90 capsule 5   magnesium oxide (MAG-OX) 400 MG tablet Take 400 mg by mouth daily.     mirtazapine (REMERON) 45 MG tablet Take 45 mg by mouth at bedtime.     montelukast (SINGULAIR) 10 MG tablet SMARTSIG:1 Tablet(s) By Mouth Every Evening     omeprazole (PRILOSEC) 40 MG capsule Take 40 mg by mouth daily.     tamsulosin (FLOMAX) 0.4 MG CAPS capsule TAKE ONE CAPSULE BY MOUTH EVERY EVENING 30 capsule 2   No current facility-administered medications for this visit.   I,Jasmine M Lassiter,acting as a scribe for Dellia Beckwith, MD.,have documented all relevant documentation on the behalf of Dellia Beckwith, MD,as directed by  Dellia Beckwith, MD while in the presence of Dellia Beckwith, MD.  I have reviewed this report as typed by the medical scribe, and it is complete and accurate.

## 2022-08-08 DIAGNOSIS — F17200 Nicotine dependence, unspecified, uncomplicated: Secondary | ICD-10-CM

## 2022-08-08 HISTORY — DX: Nicotine dependence, unspecified, uncomplicated: F17.200

## 2022-08-09 ENCOUNTER — Encounter: Payer: Self-pay | Admitting: *Deleted

## 2022-08-09 ENCOUNTER — Encounter: Payer: Self-pay | Admitting: Cardiology

## 2022-08-11 ENCOUNTER — Other Ambulatory Visit: Payer: Self-pay | Admitting: Oncology

## 2022-08-11 ENCOUNTER — Other Ambulatory Visit: Payer: Self-pay

## 2022-08-11 ENCOUNTER — Inpatient Hospital Stay: Payer: BC Managed Care – PPO | Attending: Oncology | Admitting: Oncology

## 2022-08-11 ENCOUNTER — Inpatient Hospital Stay: Payer: BC Managed Care – PPO

## 2022-08-11 ENCOUNTER — Encounter: Payer: Self-pay | Admitting: Oncology

## 2022-08-11 VITALS — BP 146/81 | HR 58 | Temp 97.9°F | Resp 16 | Ht 68.0 in | Wt 182.3 lb

## 2022-08-11 DIAGNOSIS — I82531 Chronic embolism and thrombosis of right popliteal vein: Secondary | ICD-10-CM

## 2022-08-11 DIAGNOSIS — Z79899 Other long term (current) drug therapy: Secondary | ICD-10-CM | POA: Diagnosis not present

## 2022-08-11 DIAGNOSIS — D519 Vitamin B12 deficiency anemia, unspecified: Secondary | ICD-10-CM

## 2022-08-11 DIAGNOSIS — Z86718 Personal history of other venous thrombosis and embolism: Secondary | ICD-10-CM | POA: Insufficient documentation

## 2022-08-11 DIAGNOSIS — G629 Polyneuropathy, unspecified: Secondary | ICD-10-CM

## 2022-08-11 DIAGNOSIS — Z86711 Personal history of pulmonary embolism: Secondary | ICD-10-CM | POA: Insufficient documentation

## 2022-08-11 DIAGNOSIS — F17210 Nicotine dependence, cigarettes, uncomplicated: Secondary | ICD-10-CM | POA: Insufficient documentation

## 2022-08-11 DIAGNOSIS — Z7901 Long term (current) use of anticoagulants: Secondary | ICD-10-CM | POA: Insufficient documentation

## 2022-08-11 DIAGNOSIS — F1010 Alcohol abuse, uncomplicated: Secondary | ICD-10-CM | POA: Diagnosis not present

## 2022-08-11 LAB — CMP (CANCER CENTER ONLY)
ALT: 41 U/L (ref 0–44)
AST: 30 U/L (ref 15–41)
Albumin: 4.2 g/dL (ref 3.5–5.0)
Alkaline Phosphatase: 76 U/L (ref 38–126)
Anion gap: 8 (ref 5–15)
BUN: 15 mg/dL (ref 6–20)
CO2: 27 mmol/L (ref 22–32)
Calcium: 8.8 mg/dL — ABNORMAL LOW (ref 8.9–10.3)
Chloride: 111 mmol/L (ref 98–111)
Creatinine: 1.05 mg/dL (ref 0.61–1.24)
GFR, Estimated: >60 mL/min (ref >60)
Glucose, Bld: 101 mg/dL — ABNORMAL HIGH (ref 70–99)
Potassium: 5 mmol/L (ref 3.5–5.1)
Sodium: 146 mmol/L — ABNORMAL HIGH (ref 135–145)
Total Bilirubin: 0.8 mg/dL (ref 0.3–1.2)
Total Protein: 6.8 g/dL (ref 6.5–8.1)

## 2022-08-11 LAB — CBC WITH DIFFERENTIAL (CANCER CENTER ONLY)
Abs Immature Granulocytes: 0.01 10*3/uL (ref 0.00–0.07)
Basophils Absolute: 0 10*3/uL (ref 0.0–0.1)
Basophils Relative: 0 %
Eosinophils Absolute: 0.1 10*3/uL (ref 0.0–0.5)
Eosinophils Relative: 2 %
HCT: 42.9 % (ref 39.0–52.0)
Hemoglobin: 13.8 g/dL (ref 13.0–17.0)
Immature Granulocytes: 0 %
Lymphocytes Relative: 37 %
Lymphs Abs: 2.4 10*3/uL (ref 0.7–4.0)
MCH: 32.6 pg (ref 26.0–34.0)
MCHC: 32.2 g/dL (ref 30.0–36.0)
MCV: 101.4 fL — ABNORMAL HIGH (ref 80.0–100.0)
Monocytes Absolute: 0.5 10*3/uL (ref 0.1–1.0)
Monocytes Relative: 7 %
Neutro Abs: 3.5 10*3/uL (ref 1.7–7.7)
Neutrophils Relative %: 54 %
Platelet Count: 162 10*3/uL (ref 150–400)
RBC: 4.23 MIL/uL (ref 4.22–5.81)
RDW: 13.7 % (ref 11.5–15.5)
WBC Count: 6.5 10*3/uL (ref 4.0–10.5)
nRBC: 0 % (ref 0.0–0.2)

## 2022-08-11 LAB — VITAMIN B12: Vitamin B-12: 271 pg/mL (ref 180–914)

## 2022-08-11 MED ORDER — GABAPENTIN 300 MG PO CAPS
300.0000 mg | ORAL_CAPSULE | Freq: Three times a day (TID) | ORAL | 5 refills | Status: DC
Start: 2022-08-11 — End: 2023-01-11

## 2022-08-17 ENCOUNTER — Encounter: Payer: Self-pay | Admitting: Family

## 2022-08-17 NOTE — Telephone Encounter (Signed)
Created in error

## 2022-08-24 ENCOUNTER — Encounter: Payer: Self-pay | Admitting: Family

## 2022-09-01 ENCOUNTER — Encounter: Payer: Self-pay | Admitting: Cardiology

## 2022-09-01 ENCOUNTER — Ambulatory Visit: Payer: BC Managed Care – PPO | Attending: Cardiology | Admitting: Cardiology

## 2022-09-01 VITALS — BP 96/62 | HR 68 | Ht 69.0 in | Wt 179.8 lb

## 2022-09-01 DIAGNOSIS — R072 Precordial pain: Secondary | ICD-10-CM | POA: Diagnosis not present

## 2022-09-01 DIAGNOSIS — I2510 Atherosclerotic heart disease of native coronary artery without angina pectoris: Secondary | ICD-10-CM

## 2022-09-01 DIAGNOSIS — I10 Essential (primary) hypertension: Secondary | ICD-10-CM | POA: Diagnosis not present

## 2022-09-01 DIAGNOSIS — R0609 Other forms of dyspnea: Secondary | ICD-10-CM

## 2022-09-01 DIAGNOSIS — I739 Peripheral vascular disease, unspecified: Secondary | ICD-10-CM | POA: Diagnosis not present

## 2022-09-01 DIAGNOSIS — J432 Centrilobular emphysema: Secondary | ICD-10-CM

## 2022-09-01 MED ORDER — METOPROLOL TARTRATE 100 MG PO TABS: ORAL_TABLET | ORAL | 0 refills | Status: DC

## 2022-09-01 NOTE — Progress Notes (Signed)
Cardiology Consultation:    Date:  09/01/2022   ID:  Aaron Cunningham [place], DOB August 19, 1965, MRN [id number]  PCP:  Olive Bass, MD  Cardiologist:  Gypsy Balsam, MD   Referring MD: Olive Bass, MD   Chief Complaint  Patient presents with   Hypertension    History of Present Illness:    Osborn Hoglen is a 57 y.o. male who is being seen today for the evaluation of multiple risk factors for coronary artery disease at the request of Dough, Doris Cheadle, MD. very complex past medical history.  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  Also advanced peripheral vascular disease with intervention done in 2007 it documented his aortoiliac bypass graft, but CT done thereafter described stent in the right common iliac artery he does have hypercoagulable disorder followed by oncology/hematology.  Additional problem include essential hypertension, dyslipidemia, smoking which is still ongoing.  He was referred to [place] because CT of his chest showed significant calcification of the coronary arteries.  He denies have any chest pain tightness squeezing pressure burning chest but what happened is when he walks he developed significant pain in his both lower extremities.  That would prevent him from walking more.  He is not on any special diet he does not exercise problem is still smoking which is ongoing.  Past Medical History:  Diagnosis Date   Acute embolism and thrombosis of deep vein of left distal lower extremity (HCC) 03/11/2015   Alcohol withdrawal syndrome with complication (HCC) 11/02/2015   Formatting of this note might be different from the original.  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  04/30/2020      Bronchiolitis 02/17/2014   Chronic bronchitis with acute exacerbation (HCC) 08/14/2017   Formatting of this note might be different from the original.  2019: ED eval  2020: [facility] of this note might be different from the original.  2019: ED eval  2020: Hosp  04/20/2021     CKD (chronic kidney disease) 11/02/2015   Claudication of lower extremity with history of revascularization (HCC) 06/17/2021   06/17/2021: right     Coronary artery disease involving native coronary artery of native heart without angina pectoris 02/25/2022   02/25/2022: incidental on CT chest, severe 3V calcification     Current every day smoker 08/08/2022   Demand ischemia 01/09/2015   DVT (deep venous thrombosis) (HCC)    DVT, lower extremity (HCC) 10/14/2014   Emphysema of lung (HCC) 07/18/2018   Formatting of this note might be different from the original.  2020: stopped cigs, very severe obs on spirometry     Essential hypertension 10/06/2017   Family history of premature coronary artery disease 12/05/2017   Formatting of this note might be different from the original.  father     Generalized anxiety disorder 09/28/2015   GERD (gastroesophageal reflux disease)    Hyperlipidemia    Kidney stones 11/02/2015   Lumbar back pain with radiculopathy affecting left lower extremity 05/17/2018   Formatting of this note might be different from the original.  2020: bilateral L5     Moderate episode of recurrent major depressive disorder (HCC) 12/05/2017   Formatting of this note  might be different from the original.  09-10-2014: wife deceased  2017-09-09:     Neuropathy 12/20/2021   12/20/2021: soles, recent onset     Non-recurrent unilateral inguinal hernia without obstruction or gangrene 06/08/2020   Formatting of this note might be different from the original.  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   Pneumomediastinum (HCC)  03/28/2018   Pneumonia 02/17/2014   PVD (peripheral vascular disease) (HCC) 01/09/2015   Right iliac stent 2005-09-09   Saddle embolus of pulmonary artery without acute cor pulmonale (HCC) 03/11/2015   Thrombocytopenia (HCC) 01/09/2015   Wide-complex tachycardia 01/09/2015    Past Surgical History:  Procedure Laterality Date   AORTA - ILIAC ARTERY BYPASS GRAFT Right 09-09-05   CATARACT EXTRACTION W/ INTRAOCULAR LENS IMPLANT     CHOLECYSTECTOMY  10/2015   HAND SURGERY     IVC FILTER PLACEMENT ([facility] HX)  06/12/2014   MANDIBLE FRACTURE SURGERY Right 1983   ROTATOR CUFF REPAIR Left 1995   THROMBOLYSIS Right 11/25/2008   Lower Extrim.   TONSILLECTOMY      Current Medications: Current Meds  Medication Sig   albuterol (VENTOLIN HFA) 108 (90 Base) MCG/ACT inhaler Inhale 2 puffs into the lungs every 4 (four) hours as needed for wheezing or shortness of breath.   atorvastatin (LIPITOR) 80 MG tablet Take 80 mg by mouth daily.   buPROPion (WELLBUTRIN SR) 100 MG 12 hr tablet Take 100 mg by mouth every morning.   EPINEPHrine (EPIPEN 2-PAK) 0.3 mg/0.3 mL IJ SOAJ injection Inject 0.3 mg into the muscle as needed for anaphylaxis.   escitalopram (LEXAPRO) 10 MG tablet TAKE ONE TABLET BY MOUTH EVERY EVENING (Patient taking differently: Take 10 mg by mouth at bedtime.)   ezetimibe (ZETIA) 10 MG tablet Take 10 mg by mouth daily.   folic acid (FOLVITE) 1 MG tablet TAKE ONE TABLET BY MOUTH EVERY EVENING   fondaparinux (ARIXTRA) 7.5 MG/0.6ML SOLN injection INJECT 0.6 MLS(7.5 MG TOTAL) INTO THE SKIN DAILY (Patient taking differently: Inject 7.5 mg into the skin daily.)   gabapentin (NEURONTIN) 300 MG capsule Take 1 capsule (300 mg total) by mouth 3 (three) times daily. Start with 1 by mouth at bedtime   magnesium oxide (MAG-OX) 400 MG tablet Take 400 mg by mouth daily.   metoprolol tartrate (LOPRESSOR) 100 MG tablet Take one tablet 2 hours before cardiac CT for heart greater than 55   mirtazapine (REMERON) 45 MG  tablet Take 45 mg by mouth at bedtime.   montelukast (SINGULAIR) 10 MG tablet Take 10 mg by mouth at bedtime.   omeprazole (PRILOSEC) 40 MG capsule Take 40 mg by mouth daily.   tamsulosin (FLOMAX) 0.4 MG CAPS capsule TAKE ONE CAPSULE BY MOUTH EVERY EVENING (Patient taking differently: Take 0.4 mg by mouth daily after supper.)   [DISCONTINUED] fluticasone (FLONASE) 50 MCG/ACT nasal spray Place 1 spray into both nostrils daily.     Allergies:   Patient has no known allergies.   Social History   Socioeconomic History   Marital status: Widowed    Spouse name: Not on file   Number of children: 2   Years of education: Not on file   Highest education level: Not on file  Occupational History   Not on file  Tobacco Use   Smoking status: Heavy Smoker    Current packs/day: 1.00    Average packs/day: 1 pack/day for 30.0 years (  30.0 ttl pk-yrs)    Types: Cigarettes    Passive exposure: Current   Smokeless tobacco: Former   Tobacco comments:    Visual merchandiser   Vaping status: Former  Substance and Sexual Activity   Alcohol use: Not Currently   Drug use: No   Sexual activity: Not on file  Other Topics Concern   Not on file  Social History Narrative   Not on file   Social Determinants of Health   Financial Resource Strain: Not on file  Food Insecurity: Low Risk  (07/28/2022)   Received from [facility]   Food vital sign    Within the past 12 months, you worried that your food would run out before you got money to buy more: Never true    Within the past 12 months, the food you bought just didn't last and you didn't have money to get more. : Never true  Transportation Needs: Not on file (07/28/2022)  Physical Activity: Not on file  Stress: Not on file  Social Connections: Not on file     Family History: The patient's family history includes Cirrhosis in his mother; Coronary artery disease in his father; Deep vein thrombosis in his father; Heart attack in his father; Heart  disease (age of onset: 3) in his father; Hyperlipidemia in his father; Stroke in his father. ROS:   Please see the history of present illness.    All 14 point review of systems negative except as described per history of present illness.  EKGs/Labs/Other Studies Reviewed:    The following studies were reviewed today:   EKG:       Recent Labs: 08/11/2022: ALT 41; BUN 15; Creatinine 1.05; Hemoglobin 13.8; Platelet Count 162; Potassium 5.0; Sodium 146  Recent Lipid Panel No results found for: "CHOL", "TRIG", "HDL", "CHOLHDL", "VLDL", "LDLCALC", "LDLDIRECT"  Physical Exam:    VS:  BP 96/62 (BP Location: Left Arm, Patient Position: Sitting)   Pulse 68   Ht 5\' 9"  (1.753 m)   Wt 179 lb 12.8 oz (81.6 kg)   SpO2 98%   BMI 26.55 kg/m     Wt Readings from Last 3 Encounters:  09/01/22 179 lb 12.8 oz (81.6 kg)  08/11/22 182 lb 4.8 oz (82.7 kg)  08/08/22 180 lb (81.6 kg)     GEN:  Well nourished, well developed in no acute distress HEENT: Normal NECK: No JVD; No carotid bruits LYMPHATICS: No lymphadenopathy CARDIAC: RRR, no murmurs, no rubs, no gallops RESPIRATORY:  Clear to auscultation without rales, wheezing or rhonchi  ABDOMEN: Soft, non-tender, non-distended MUSCULOSKELETAL:  No edema; No deformity  SKIN: Warm and dry NEUROLOGIC:  Alert and oriented x 3 PSYCHIATRIC:  Normal affect   ASSESSMENT:    1. Coronary artery disease involving native coronary artery of native heart without angina pectoris   2. Precordial pain   3. Essential hypertension   4. PVD (peripheral vascular disease) (HCC)   5. Centrilobular emphysema (HCC)   6. Dyspnea on exertion    PLAN:    In order of problems listed above:  Coronary artery disease he does have significant calcification of the coronary artery, we will do coronary CT angio to see if there is an obstructive disease.  In the meantime we will continue with risk factors modifications obviously he need to quit smoking he understand this  and is trying to work on it. Advanced atherosclerosis I worry about potentially having carotid disease.  Will schedule him to have carotic ultrasound. Peripheral  vascular disease still have claudications but apparently is being evaluated by vascular team from [facility].  Will try to get latest information about his problem.  He does have very weak pulses in the right side.  He said this is the leg to direct him the most. COPD and emphysema obviously related to smoking spent at least 5 to 10 minutes talking about need to quit hopefully he will be able to accomplish that. Overall it looks like he does have advanced atherosclerosis on top of that some hypercoagulable state.  He is on fondaparinux which I will continue.  I will communicate also with our hematology team to see if there will be room for some antiplatelet agent.   Medication Adjustments/Labs and Tests Ordered: Current medicines are reviewed at length with the patient today.  Concerns regarding medicines are outlined above.  Orders Placed This Encounter  Procedures   CT CORONARY MORPH W/CTA COR W/SCORE W/CA W/CM &/OR WO/CM   EKG 12-Lead   ECHOCARDIOGRAM COMPLETE   VAS US CAROTID   Meds ordered this encounter  Medications   metoprolol tartrate (LOPRESSOR) 100 MG tablet    Sig: Take one tablet 2 hours before cardiac CT for heart greater than 55    Dispense:  1 tablet    Refill:  0    Signed, Georgeanna Lea, MD, [facility]. 09/01/2022 4:27 PM    [facility]

## 2022-09-01 NOTE — Patient Instructions (Signed)
Medication Instructions:   TAKE: Metoprolol 100mg  1 tablet 2 hours prior to CT scan  *If you need a refill on your cardiac medications before your next appointment, please call your pharmacy*   Lab Work: Labs completed 08-11-22 If you have labs (blood work) drawn today and your tests are completely normal, you will receive your results only by: MyChart Message (if you have MyChart) OR A paper copy in the mail If you have any lab test that is abnormal or we need to change your treatment, we will call you to review the results.   Testing/Procedures:  Your physician has requested that you have a carotid duplex. This test is an ultrasound of the carotid arteries in your neck. It looks at blood flow through these arteries that supply the brain with blood. Allow one hour for this exam. There are no restrictions or special instructions.   Your physician has requested that you have an echocardiogram. Echocardiography is a painless test that uses sound waves to create images of your heart. It provides your doctor with information about the size and shape of your heart and how well your heart's chambers and valves are working. This procedure takes approximately one hour. There are no restrictions for this procedure. Please do NOT wear cologne, perfume, aftershave, or lotions (deodorant is allowed). Please arrive 15 minutes prior to your appointment time.    Your physician has requested that you have cardiac CT. Cardiac computed tomography (CT) is a painless test that uses an x-ray machine to take clear, detailed pictures of your heart. For further information please visit [email or web address] Please follow instruction sheet as given.    Your Cardiac CT will be scheduled at:   [facility] located off [facility] at the hospital.  Please arrive 30 minutes prior to your appointment time.  You can use the FREE valet parking offered at entrance to outpatient center (encouraged  to control the heart rate for the test)   Please follow these instructions carefully (unless otherwise directed):  Hold all erectile dysfunction medications at least 3 days (72 hrs) prior to test.  On the Night Before the Test: Be sure to Drink plenty of water. Do not consume any caffeinated/decaffeinated beverages or chocolate 12 hours prior to your test. Do not take any antihistamines 12 hours prior to your test.  On the Day of the Test: Drink plenty of water until 1 hour prior to the test. Do not eat any food 4 hours prior to the test. No smoking 4 hours prior to test. You may take your regular medications prior to the test.  Take metoprolol (Lopressor) two hours prior to test.   After the Test: Drink plenty of water. After receiving IV contrast, you may experience a mild flushed feeling. This is normal. On occasion, you may experience a mild rash up to 24 hours after the test. This is not dangerous. If this occurs, you can take Benadryl 25 mg and increase your fluid intake. If you experience trouble breathing, this can be serious. If it is severe call 911 IMMEDIATELY. If it is mild, please call our office. If you take any of these medications: Glipizide/Metformin, Avandament, Glucavance, please do not take 48 hours after completing test unless otherwise instructed.  We will call to schedule your test 2-4 weeks out understanding that some insurance companies will need an authorization prior to the service being performed.      Follow-Up: At [facility], you and your health needs are  our priority.  As part of our continuing mission to provide you with exceptional heart care, we have created designated Provider Care Teams.  These Care Teams include your primary Cardiologist (physician) and Advanced Practice Providers (APPs -  Physician Assistants and Nurse Practitioners) who all work together to provide you with the care you need, when you need it.  We recommend signing up  for the patient portal called "MyChart".  Sign up information is provided on this After Visit Summary.  MyChart is used to connect with patients for Virtual Visits (Telemedicine).  Patients are able to view lab/test results, encounter notes, upcoming appointments, etc.  Non-urgent messages can be sent to your provider as well.   To learn more about what you can do with MyChart, go to [email or web address].    Your next appointment:   2 month(s)  The format for your next appointment:   In Person  Provider:   Gypsy Balsam, MD    Other Instructions Cardiac CT Angiogram A cardiac CT angiogram is a procedure to look at the heart and the area around the heart. It may be done to help find the cause of chest pains or other symptoms of heart disease. During this procedure, a substance called contrast dye is injected into the blood vessels in the area to be checked. A large X-ray machine, called a CT scanner, then takes detailed pictures of the heart and the surrounding area. The procedure is also sometimes called a coronary CT angiogram, coronary artery scanning, or CTA. A cardiac CT angiogram allows the health care provider to see how well blood is flowing to and from the heart. The health care provider will be able to see if there are any problems, such as: Blockage or narrowing of the coronary arteries in the heart. Fluid around the heart. Signs of weakness or disease in the muscles, valves, and tissues of the heart. Tell a health care provider about: Any allergies you have. This is especially important if you have had a previous allergic reaction to contrast dye. All medicines you are taking, including vitamins, herbs, eye drops, creams, and over-the-counter medicines. Any blood disorders you have. Any surgeries you have had. Any medical conditions you have. Whether you are pregnant or may be pregnant. Any anxiety disorders, chronic pain, or other conditions you have that may increase your  stress or prevent you from lying still. What are the risks? Generally, this is a safe procedure. However, problems may occur, including: Bleeding. Infection. Allergic reactions to medicines or dyes. Damage to other structures or organs. Kidney damage from the contrast dye that is used. Increased risk of cancer from radiation exposure. This risk is low. Talk with your health care provider about: The risks and benefits of testing. How you can receive the lowest dose of radiation. What happens before the procedure? Wear comfortable clothing and remove any jewelry, glasses, dentures, and hearing aids. Follow instructions from your health care provider about eating and drinking. This may include: For 12 hours before the procedure -- avoid caffeine. This includes tea, coffee, soda, energy drinks, and diet pills. Drink plenty of water or other fluids that do not have caffeine in them. Being well hydrated can prevent complications. For 4-6 hours before the procedure -- stop eating and drinking. The contrast dye can cause nausea, but this is less likely if your stomach is empty. Ask your health care provider about changing or stopping your regular medicines. This is especially important if you are taking diabetes medicines, blood  thinners, or medicines to treat problems with erections (erectile dysfunction). What happens during the procedure?  Hair on your chest may need to be removed so that small sticky patches called electrodes can be placed on your chest. These will transmit information that helps to monitor your heart during the procedure. An IV will be inserted into one of your veins. You might be given a medicine to control your heart rate during the procedure. This will help to ensure that good images are obtained. You will be asked to lie on an exam table. This table will slide in and out of the CT machine during the procedure. Contrast dye will be injected into the IV. You might feel warm, or  you may get a metallic taste in your mouth. You will be given a medicine called nitroglycerin. This will relax or dilate the arteries in your heart. The table that you are lying on will move into the CT machine tunnel for the scan. The person running the machine will give you instructions while the scans are being done. You may be asked to: Keep your arms above your head. Hold your breath. Stay very still, even if the table is moving. When the scanning is complete, you will be moved out of the machine. The IV will be removed. The procedure may vary among health care providers and hospitals. What can I expect after the procedure? After your procedure, it is common to have: A metallic taste in your mouth from the contrast dye. A feeling of warmth. A headache from the nitroglycerin. Follow these instructions at home: Take over-the-counter and prescription medicines only as told by your health care provider. If you are told, drink enough fluid to keep your urine pale yellow. This will help to flush the contrast dye out of your body. Most people can return to their normal activities right after the procedure. Ask your health care provider what activities are safe for you. It is up to you to get the results of your procedure. Ask your health care provider, or the department that is doing the procedure, when your results will be ready. Keep all follow-up visits as told by your health care provider. This is important. Contact a health care provider if: You have any symptoms of allergy to the contrast dye. These include: Shortness of breath. Rash or hives. A racing heartbeat. Summary A cardiac CT angiogram is a procedure to look at the heart and the area around the heart. It may be done to help find the cause of chest pains or other symptoms of heart disease. During this procedure, a large X-ray machine, called a CT scanner, takes detailed pictures of the heart and the surrounding area after a  contrast dye has been injected into blood vessels in the area. Ask your health care provider about changing or stopping your regular medicines before the procedure. This is especially important if you are taking diabetes medicines, blood thinners, or medicines to treat erectile dysfunction. If you are told, drink enough fluid to keep your urine pale yellow. This will help to flush the contrast dye out of your body. This information is not intended to replace advice given to you by your health care provider. Make sure you discuss any questions you have with your health care provider. Document Revised: 04/22/2021 Document Reviewed: 08/29/2018 Elsevier Patient Education  2023 Elsevier Inc.   Important Information About Sugar

## 2022-09-05 ENCOUNTER — Telehealth: Payer: Self-pay

## 2022-09-05 NOTE — Telephone Encounter (Signed)
Patient notified of lab results

## 2022-09-05 NOTE — Telephone Encounter (Signed)
-----   Message from Dellia Beckwith sent at 08/24/2022  7:23 PM EDT ----- Regarding: call Tell him labs look good, incl B12 and BS of 101

## 2022-09-12 ENCOUNTER — Encounter: Payer: Self-pay | Admitting: Cardiology

## 2022-09-14 ENCOUNTER — Other Ambulatory Visit: Payer: Self-pay | Admitting: Hematology & Oncology

## 2022-09-14 DIAGNOSIS — I82413 Acute embolism and thrombosis of femoral vein, bilateral: Secondary | ICD-10-CM

## 2022-09-14 DIAGNOSIS — I824Z2 Acute embolism and thrombosis of unspecified deep veins of left distal lower extremity: Secondary | ICD-10-CM

## 2022-09-15 ENCOUNTER — Encounter: Payer: Self-pay | Admitting: Cardiology

## 2022-09-20 ENCOUNTER — Encounter: Payer: Self-pay | Admitting: Cardiology

## 2022-09-22 ENCOUNTER — Telehealth: Payer: Self-pay

## 2022-09-22 NOTE — Telephone Encounter (Signed)
Results reviewed with pt as per Dr. Krasowski's note.  Pt verbalized understanding and had no additional questions. Routed to PCP  

## 2022-10-06 ENCOUNTER — Other Ambulatory Visit: Payer: BC Managed Care – PPO

## 2022-10-17 ENCOUNTER — Telehealth: Payer: Self-pay

## 2022-10-17 NOTE — Telephone Encounter (Signed)
-----   Message from Adah Perl sent at 10/17/2022  4:39 PM EDT ----- Regarding: RE: Arixtra Yes, just get up and walk around at least once during the flight and be sure to continue arixtra. Thanks ----- Message ----- From: Dyane Dustman, RN Sent: 10/17/2022   4:33 PM EDT To: Adah Perl, PA-C Subject: Arixtra                                        Called to ask if it was okay for him to take a 3 hour flight with the blood clot in his leg?  He is currently taking Arixtra 7.5mg  of Arixtra daily. [phone].

## 2022-10-17 NOTE — Telephone Encounter (Signed)
Patient is aware 

## 2022-10-27 ENCOUNTER — Ambulatory Visit: Payer: BC Managed Care – PPO | Attending: Cardiology

## 2022-10-27 ENCOUNTER — Ambulatory Visit (INDEPENDENT_AMBULATORY_CARE_PROVIDER_SITE_OTHER): Payer: BC Managed Care – PPO

## 2022-10-27 DIAGNOSIS — R0609 Other forms of dyspnea: Secondary | ICD-10-CM | POA: Diagnosis not present

## 2022-10-27 DIAGNOSIS — I2510 Atherosclerotic heart disease of native coronary artery without angina pectoris: Secondary | ICD-10-CM

## 2022-10-27 DIAGNOSIS — I6523 Occlusion and stenosis of bilateral carotid arteries: Secondary | ICD-10-CM

## 2022-10-27 LAB — ECHOCARDIOGRAM COMPLETE: S' Lateral: 2.2 cm

## 2022-10-28 ENCOUNTER — Telehealth: Payer: Self-pay

## 2022-10-28 ENCOUNTER — Other Ambulatory Visit: Payer: Self-pay

## 2022-10-28 DIAGNOSIS — I739 Peripheral vascular disease, unspecified: Secondary | ICD-10-CM

## 2022-10-28 NOTE — Telephone Encounter (Signed)
-----   Message from [facility] sent at 10/27/2022  7:54 PM EDT ----- Normal or stable result  Good result.

## 2022-10-28 NOTE — Telephone Encounter (Signed)
Patient notified through my chart.

## 2022-11-17 ENCOUNTER — Encounter: Payer: Self-pay | Admitting: Cardiology

## 2022-11-17 ENCOUNTER — Ambulatory Visit: Payer: BC Managed Care – PPO | Attending: Cardiology | Admitting: Cardiology

## 2022-11-17 VITALS — BP 142/80 | HR 61 | Ht 68.0 in | Wt 188.2 lb

## 2022-11-17 DIAGNOSIS — R0789 Other chest pain: Secondary | ICD-10-CM

## 2022-11-17 DIAGNOSIS — J209 Acute bronchitis, unspecified: Secondary | ICD-10-CM | POA: Diagnosis not present

## 2022-11-17 DIAGNOSIS — J42 Unspecified chronic bronchitis: Secondary | ICD-10-CM

## 2022-11-17 DIAGNOSIS — I10 Essential (primary) hypertension: Secondary | ICD-10-CM | POA: Diagnosis not present

## 2022-11-17 DIAGNOSIS — I2510 Atherosclerotic heart disease of native coronary artery without angina pectoris: Secondary | ICD-10-CM | POA: Diagnosis not present

## 2022-11-17 DIAGNOSIS — Z86711 Personal history of pulmonary embolism: Secondary | ICD-10-CM

## 2022-11-17 NOTE — Progress Notes (Signed)
Cardiology Office Note:    Date:  11/17/2022   ID:  Aaron Cunningham, DOB 1965-12-12, MRN [id number]  PCP:  Olive Bass, MD  Cardiologist:  Gypsy Balsam, MD    Referring MD: Olive Bass, MD   Chief Complaint  Patient presents with   Follow-up    History of Present Illness:    Aaron Cunningham is a 57 y.o. male past medical history significant for recurrent pulmonary emboli even on oral anticoagulants now he is on fondaparinux, advanced peripheral vascular disease status post intervention done in 2005/12/01 with aortoiliac bypass graft, CT done 2 after she described however stenting to the right common iliac artery, he does have hypercoagulopathy and that being followed by oncology/hematology.  Additional problem include dyslipidemia smoking which is still ongoing, essential hypertension.  He did have calcification of the coronary artery after that coronary CT angio has been performed which showed hemodynamically insignificant stenosis in the right coronary artery.  Moderate in degree.  He also had carotic ultrasounds done which show up to 59% stenosis bilaterally.  He is asymptomatic he does get short of breath while walking but no chest pain tightness squeezing pressure burning chest  Past Medical History:  Diagnosis Date   Acute embolism and thrombosis of deep vein of left distal lower extremity (HCC) 03/11/2015   Alcohol withdrawal syndrome with complication (HCC) 11/02/2015   Formatting of this note might be different from the original.  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  04/30/2020     Bronchiolitis 02/17/2014   Chronic bronchitis with acute exacerbation (HCC) 08/14/2017   Formatting of this note might be different from the original.  Dec 01, 2017: ED eval   2020: [facility] of this note might be different from the original.  12/01/2017: ED eval  2020: Hosp  04/20/2021     CKD (chronic kidney disease) 11/02/2015   Claudication of lower extremity with history of revascularization (HCC) 06/17/2021   06/17/2021: right     Coronary artery disease involving native coronary artery of native heart without angina pectoris 02/25/2022   02/25/2022: incidental on CT chest, severe 3V calcification     Current every day smoker 08/08/2022   Demand ischemia (HCC) 01/09/2015   DVT (deep venous thrombosis) (HCC)    DVT, lower extremity (HCC) 10/14/2014   Emphysema of lung (HCC) 07/18/2018   Formatting of this note might be different from the original.  2018-12-02: stopped cigs, very severe obs on spirometry     Essential hypertension 10/06/2017   Family history of premature coronary artery disease 12/05/2017   Formatting of this note might be different from the original.  father     Generalized anxiety disorder 09/28/2015   GERD (gastroesophageal reflux disease)    Hyperlipidemia    Kidney stones 11/02/2015   Lumbar back pain with radiculopathy affecting left lower extremity 05/17/2018   Formatting of this note might be different from the original.  Dec 02, 2018: bilateral L5     Moderate episode of recurrent major depressive disorder (HCC) 12/05/2017   Formatting of this note might be different from the original.  12/02/14: wife deceased  Dec 01, 2017:     Neuropathy 12/20/2021   12/20/2021: soles, recent onset     Non-recurrent unilateral inguinal hernia  without obstruction or gangrene 06/08/2020   Formatting of this note might be different from the original.  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   Pneumomediastinum (HCC) 03/28/2018   Pneumonia 02/17/2014   PVD (peripheral vascular disease) (HCC) 01/09/2015   Right iliac stent 2007   Saddle embolus of pulmonary artery without acute  cor pulmonale (HCC) 03/11/2015   Thrombocytopenia (HCC) 01/09/2015   Wide-complex tachycardia 01/09/2015    Past Surgical History:  Procedure Laterality Date   AORTA - ILIAC ARTERY BYPASS GRAFT Right 2007   CATARACT EXTRACTION W/ INTRAOCULAR LENS IMPLANT     CHOLECYSTECTOMY  10/2015   HAND SURGERY     IVC FILTER PLACEMENT ([facility] HX)  06/12/2014   MANDIBLE FRACTURE SURGERY Right 1983   ROTATOR CUFF REPAIR Left 1995   THROMBOLYSIS Right 11/25/2008   Lower Extrim.   TONSILLECTOMY      Current Medications: Current Meds  Medication Sig   albuterol (VENTOLIN HFA) 108 (90 Base) MCG/ACT inhaler Inhale 2 puffs into the lungs every 4 (four) hours as needed for wheezing or shortness of breath.   atorvastatin (LIPITOR) 80 MG tablet Take 80 mg by mouth daily.   buPROPion (WELLBUTRIN SR) 100 MG 12 hr tablet Take 100 mg by mouth every morning.   EPINEPHrine (EPIPEN 2-PAK) 0.3 mg/0.3 mL IJ SOAJ injection Inject 0.3 mg into the muscle as needed for anaphylaxis.   escitalopram (LEXAPRO) 10 MG tablet TAKE ONE TABLET BY MOUTH EVERY EVENING (Patient taking differently: Take 10 mg by mouth at bedtime.)   ezetimibe (ZETIA) 10 MG tablet Take 10 mg by mouth daily.   folic acid (FOLVITE) 1 MG tablet TAKE ONE TABLET BY MOUTH EVERY EVENING   fondaparinux (ARIXTRA) 7.5 MG/0.6ML SOLN injection INJECT 0.6 MLS(7.5 MG TOTAL) INTO THE SKIN DAILY (Patient taking differently: Inject 7.5 mg into the skin daily.)   gabapentin (NEURONTIN) 300 MG capsule Take 1 capsule (300 mg total) by mouth 3 (three) times daily. Start with 1 by mouth at bedtime   magnesium oxide (MAG-OX) 400 MG tablet Take 400 mg by mouth daily.   mirtazapine (REMERON) 45 MG tablet Take 45 mg by mouth at bedtime.   montelukast (SINGULAIR) 10 MG tablet Take 10 mg by mouth at bedtime.   omeprazole (PRILOSEC) 40 MG capsule Take 40 mg by mouth daily.   tamsulosin (FLOMAX) 0.4 MG CAPS capsule TAKE ONE CAPSULE BY MOUTH EVERY EVENING (Patient taking  differently: Take 0.4 mg by mouth daily after supper.)   [DISCONTINUED] metoprolol tartrate (LOPRESSOR) 100 MG tablet Take one tablet 2 hours before cardiac CT for heart greater than 55 (Patient taking differently: 100 mg. Take one tablet 2 hours before cardiac CT for heart greater than 55)     Allergies:   Patient has no known allergies.   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   Vaping status: Former  Substance and Sexual Activity   Alcohol use: Not Currently   Drug use: No   Sexual activity: Not on file  Other Topics Concern   Not on file  Social History Narrative   Not on file   Social Determinants of Health   Financial Resource Strain: Not on file  Food Insecurity: Low Risk  (07/28/2022)   Received from [facility]   Hunger Vital Sign    Worried About Running Out of Food in the Last Year: Never true    Ran Out of Food in the Last Year: Never true  Transportation Needs: Not on file (07/28/2022)  Physical Activity: Not on file  Stress: Not on file  Social Connections: Not on file     Family History: The patient's family history includes Cirrhosis in his mother; Coronary artery disease in his father; Deep vein thrombosis in his father; Heart attack in his father; Heart disease (age of onset: 17) in his father; Hyperlipidemia in his father; Stroke in his father. ROS:   Please see the history of present illness.    All 14 point review of systems negative except as described per history of present illness  EKGs/Labs/Other  Studies Reviewed:         Recent Labs: 08/11/2022: ALT 41; BUN 15; Creatinine 1.05; Hemoglobin 13.8; Platelet Count 162; Potassium 5.0; Sodium 146  Recent Lipid Panel No results found for: "CHOL", "TRIG", "HDL", "CHOLHDL", "VLDL", "LDLCALC", "LDLDIRECT"  Physical Exam:    VS:  BP (!) 142/80 (BP Location: Left Arm, Patient Position: Sitting)   Pulse 61   Ht 5\' 8"  (1.727 m)   Wt 188 lb 3.2 oz (85.4 kg)   SpO2 95%   BMI 28.62 kg/m     Wt Readings from Last 3 Encounters:  11/17/22 188 lb 3.2 oz (85.4 kg)  09/01/22 179 lb 12.8 oz (81.6 kg)  08/11/22 182 lb 4.8 oz (82.7 kg)     GEN:  Well nourished, well developed in no acute distress HEENT: Normal NECK: No JVD; No carotid bruits LYMPHATICS: No lymphadenopathy CARDIAC: RRR, no murmurs, no rubs, no gallops RESPIRATORY:  Clear to auscultation without rales, wheezing or rhonchi  ABDOMEN: Soft, non-tender, non-distended MUSCULOSKELETAL:  No edema; No deformity  SKIN: Warm and dry LOWER EXTREMITIES: no swelling NEUROLOGIC:  Alert and oriented x 3 PSYCHIATRIC:  Normal affect   ASSESSMENT:    1. Essential hypertension   2. Coronary artery disease involving native coronary artery of native heart without angina pectoris   3. Chronic bronchitis with acute exacerbation (HCC)   4. Atypical chest pain   5. History of pulmonary embolism    PLAN:    In order of problems listed above:  Coronary disease coronary CT angio reviewed with patient no target lesion for intervention.  The key is to modify his risk factors for coronary artery disease. Peripheral vascular disease echocardiogram ultrasounds reviewed with the patient show up to 59% stenosis both sides.  The key will be to modify his risk factors. Essential hypertension: Blood pressure well-controlled he said to check at home 130/80 and less.  Will continue monitoring. Smoking obviously huge problem spent at least 5 minutes talk about all different techniques he can use to stop his  habit.  He did quit years ago simply by stopping he is determined that he will do this hopefully he will be able to prophy this point. History of pulmonary emboli.  Continue monitoring.  Monitor on anticoagulation  Medication Adjustments/Labs and Tests Ordered: Current medicines are reviewed at length with the patient today.  Concerns regarding medicines are outlined above.  No orders of the defined types were placed in this encounter.  Medication changes: No orders of the defined types were placed in this encounter.   Signed, Georgeanna Lea, MD, [facility] 11/17/2022 1:41 PM    [facility]

## 2022-11-17 NOTE — Addendum Note (Signed)
Addended by: Baldo Ash D on: 11/17/2022 02:03 PM   Modules accepted: Orders

## 2022-11-17 NOTE — Patient Instructions (Addendum)
Medication Instructions:  Your physician recommends that you continue on your current medications as directed. Please refer to the Current Medication list given to you today.  *If you need a refill on your cardiac medications before your next appointment, please call your pharmacy*   Lab Work: Lipid profile, Lpa-today If you have labs (blood work) drawn today and your tests are completely normal, you will receive your results only by: MyChart Message (if you have MyChart) OR A paper copy in the mail If you have any lab test that is abnormal or we need to change your treatment, we will call you to review the results.   Testing/Procedures: None Ordered   Follow-Up: At [facility], you and your health needs are our priority.  As part of our continuing mission to provide you with exceptional heart care, we have created designated Provider Care Teams.  These Care Teams include your primary Cardiologist (physician) and Advanced Practice Providers (APPs -  Physician Assistants and Nurse Practitioners) who all work together to provide you with the care you need, when you need it.  We recommend signing up for the patient portal called "MyChart".  Sign up information is provided on this After Visit Summary.  MyChart is used to connect with patients for Virtual Visits (Telemedicine).  Patients are able to view lab/test results, encounter notes, upcoming appointments, etc.  Non-urgent messages can be sent to your provider as well.   To learn more about what you can do with MyChart, go to [email or web address].    Your next appointment:   6 month(s)  The format for your next appointment:   In Person  Provider:   Gypsy Balsam, MD    Other Instructions NA

## 2022-11-19 LAB — LIPOPROTEIN A (LPA): Lipoprotein (a): 226.2 nmol/L — ABNORMAL HIGH (ref <75.0)

## 2022-11-19 LAB — LIPID PANEL
Chol/HDL Ratio: 2.1 ratio (ref 0.0–5.0)
Cholesterol, Total: 118 mg/dL (ref 100–199)
HDL: 55 mg/dL (ref >39)
LDL Chol Calc (NIH): 49 mg/dL (ref 0–99)
Triglycerides: 65 mg/dL (ref 0–149)
VLDL Cholesterol Cal: 14 mg/dL (ref 5–40)

## 2022-11-25 ENCOUNTER — Telehealth: Payer: Self-pay

## 2022-11-25 NOTE — Telephone Encounter (Signed)
Pt viewed results in My Chart per Dr. Krasowski's note. Routed to PCP.  

## 2022-11-25 NOTE — Telephone Encounter (Signed)
Left message on My Chart with normal results per Dr. Krasowski's note. Routed to PCP. 

## 2022-11-29 ENCOUNTER — Telehealth: Payer: Self-pay

## 2022-11-29 NOTE — Telephone Encounter (Signed)
Patient has called to let us know his disability starts in [place] and that he would like for his injections that Dr. Gilman Buttner prescribes sent to his pharmacy for a 3 months supply instead of monthly to help with his cost of medications.

## 2022-12-13 ENCOUNTER — Other Ambulatory Visit: Payer: Self-pay | Admitting: Hematology & Oncology

## 2022-12-13 DIAGNOSIS — I82413 Acute embolism and thrombosis of femoral vein, bilateral: Secondary | ICD-10-CM

## 2022-12-13 DIAGNOSIS — I824Z2 Acute embolism and thrombosis of unspecified deep veins of left distal lower extremity: Secondary | ICD-10-CM

## 2022-12-14 ENCOUNTER — Other Ambulatory Visit: Payer: Self-pay | Admitting: Hematology and Oncology

## 2022-12-14 DIAGNOSIS — Z86711 Personal history of pulmonary embolism: Secondary | ICD-10-CM

## 2022-12-14 DIAGNOSIS — Z7901 Long term (current) use of anticoagulants: Secondary | ICD-10-CM

## 2022-12-14 DIAGNOSIS — Z86718 Personal history of other venous thrombosis and embolism: Secondary | ICD-10-CM

## 2022-12-14 MED ORDER — FONDAPARINUX SODIUM 7.5 MG/0.6ML ~~LOC~~ SOLN: 7.5000 mg | SUBCUTANEOUS | 1 refills | Status: DC

## 2023-01-10 ENCOUNTER — Other Ambulatory Visit: Payer: Self-pay | Admitting: Oncology

## 2023-01-10 DIAGNOSIS — G629 Polyneuropathy, unspecified: Secondary | ICD-10-CM

## 2023-01-16 ENCOUNTER — Encounter: Payer: Self-pay | Admitting: Family

## 2023-01-30 ENCOUNTER — Other Ambulatory Visit: Payer: Self-pay | Admitting: Hematology & Oncology

## 2023-01-30 DIAGNOSIS — I824Z2 Acute embolism and thrombosis of unspecified deep veins of left distal lower extremity: Secondary | ICD-10-CM

## 2023-01-31 ENCOUNTER — Telehealth: Payer: Self-pay | Admitting: Cardiology

## 2023-01-31 NOTE — Telephone Encounter (Signed)
 October office note faxed to PCP per request

## 2023-01-31 NOTE — Telephone Encounter (Signed)
 Office is calling to request Clinical notes from October office visit since they never received anything after that appt. They like it faxed to [phone]. Please advise

## 2023-02-10 ENCOUNTER — Encounter: Payer: Self-pay | Admitting: Family

## 2023-02-10 ENCOUNTER — Inpatient Hospital Stay: Payer: Medicare PPO | Admitting: Oncology

## 2023-02-10 ENCOUNTER — Encounter: Payer: Self-pay | Admitting: Oncology

## 2023-02-10 ENCOUNTER — Inpatient Hospital Stay: Payer: Medicare PPO | Attending: Oncology

## 2023-02-10 ENCOUNTER — Telehealth: Payer: Self-pay | Admitting: Oncology

## 2023-02-10 VITALS — BP 126/81 | HR 60 | Temp 98.4°F | Resp 18 | Ht 68.0 in | Wt 182.1 lb

## 2023-02-10 DIAGNOSIS — Z7901 Long term (current) use of anticoagulants: Secondary | ICD-10-CM

## 2023-02-10 DIAGNOSIS — G629 Polyneuropathy, unspecified: Secondary | ICD-10-CM | POA: Insufficient documentation

## 2023-02-10 DIAGNOSIS — F17210 Nicotine dependence, cigarettes, uncomplicated: Secondary | ICD-10-CM | POA: Insufficient documentation

## 2023-02-10 DIAGNOSIS — Z86718 Personal history of other venous thrombosis and embolism: Secondary | ICD-10-CM | POA: Insufficient documentation

## 2023-02-10 DIAGNOSIS — Z86711 Personal history of pulmonary embolism: Secondary | ICD-10-CM | POA: Insufficient documentation

## 2023-02-10 DIAGNOSIS — I10 Essential (primary) hypertension: Secondary | ICD-10-CM | POA: Insufficient documentation

## 2023-02-10 DIAGNOSIS — I82531 Chronic embolism and thrombosis of right popliteal vein: Secondary | ICD-10-CM

## 2023-02-10 DIAGNOSIS — Z79899 Other long term (current) drug therapy: Secondary | ICD-10-CM

## 2023-02-10 DIAGNOSIS — Z720 Tobacco use: Secondary | ICD-10-CM

## 2023-02-10 DIAGNOSIS — F1010 Alcohol abuse, uncomplicated: Secondary | ICD-10-CM | POA: Insufficient documentation

## 2023-02-10 DIAGNOSIS — I825Z1 Chronic embolism and thrombosis of unspecified deep veins of right distal lower extremity: Secondary | ICD-10-CM

## 2023-02-10 DIAGNOSIS — D510 Vitamin B12 deficiency anemia due to intrinsic factor deficiency: Secondary | ICD-10-CM

## 2023-02-10 LAB — CMP (CANCER CENTER ONLY)
ALT: 14 U/L (ref 0–44)
AST: 21 U/L (ref 15–41)
Albumin: 4.7 g/dL (ref 3.5–5.0)
Alkaline Phosphatase: 100 U/L (ref 38–126)
Anion gap: 12 (ref 5–15)
BUN: 18 mg/dL (ref 6–20)
CO2: 29 mmol/L (ref 22–32)
Calcium: 9.4 mg/dL (ref 8.9–10.3)
Chloride: 104 mmol/L (ref 98–111)
Creatinine: 1.43 mg/dL — ABNORMAL HIGH (ref 0.61–1.24)
GFR, Estimated: 57 mL/min — ABNORMAL LOW (ref >60)
Glucose, Bld: 135 mg/dL — ABNORMAL HIGH (ref 70–99)
Potassium: 4.5 mmol/L (ref 3.5–5.1)
Sodium: 144 mmol/L (ref 135–145)
Total Bilirubin: 0.7 mg/dL (ref 0.0–1.2)
Total Protein: 7.4 g/dL (ref 6.5–8.1)

## 2023-02-10 LAB — CBC WITH DIFFERENTIAL (CANCER CENTER ONLY)
Abs Immature Granulocytes: 0.01 10*3/uL (ref 0.00–0.07)
Basophils Absolute: 0 10*3/uL (ref 0.0–0.1)
Basophils Relative: 0 %
Eosinophils Absolute: 0.2 10*3/uL (ref 0.0–0.5)
Eosinophils Relative: 2 %
HCT: 43.5 % (ref 39.0–52.0)
Hemoglobin: 14.7 g/dL (ref 13.0–17.0)
Immature Granulocytes: 0 %
Lymphocytes Relative: 32 %
Lymphs Abs: 2.1 10*3/uL (ref 0.7–4.0)
MCH: 32.7 pg (ref 26.0–34.0)
MCHC: 33.8 g/dL (ref 30.0–36.0)
MCV: 96.9 fL (ref 80.0–100.0)
Monocytes Absolute: 0.4 10*3/uL (ref 0.1–1.0)
Monocytes Relative: 6 %
Neutro Abs: 4 10*3/uL (ref 1.7–7.7)
Neutrophils Relative %: 60 %
Platelet Count: 179 10*3/uL (ref 150–400)
RBC: 4.49 MIL/uL (ref 4.22–5.81)
RDW: 13.5 % (ref 11.5–15.5)
WBC Count: 6.7 10*3/uL (ref 4.0–10.5)
nRBC: 0 % (ref 0.0–0.2)
nRBC: 0 /100{WBCs}

## 2023-02-10 LAB — VITAMIN B12: Vitamin B-12: 202 pg/mL (ref 180–914)

## 2023-02-10 NOTE — Telephone Encounter (Signed)
02/10/23 Next appt scheduled and confirmed with patient

## 2023-02-10 NOTE — Progress Notes (Signed)
[facility]  [address] [place],  [place]  [address] [phone]  Clinic Day:  02/10/23   Referring physician: Olive Bass, MD  ASSESSMENT & PLAN:   Assessment & Plan: 1.  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 He has been seen by Dr. Arlan Organ in the past, and was negative for polycythemia vera. He was changed from Lovenox at 120 mg (1 mg/kg) to Arixtra injections 7.5mg  (total) daily and is tolerating this well.  Continue Arixtra.     2.  Episode of pulmonary emboli.   3. Extensive thrombosis of the right lower extremity throughout the leg and occlusive thrombus in the left common femoral vein as well.  This has been debilitating and painful.  He was given Tramadol without much relief, and so has been using Tylenol.  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  He has clot in the inferior vena cava and surrounding his IVC filter so is at extremely high risk for complications.   4. Tobacco abuse.  He has smoked 1 pack per day for at least 30 years. Trying to cut back and he hopes to quit soon.    5. Alcohol abuse.  He has stopped drinking alcohol since January 2022.   6. Hypertension, improved. He will follow-up with Dr. Sol Passer.  7. Peripheral Neuropathy. This is part of his chronic pain and may be related to his prior alcohol abuse. On gabapentin. Does not notice much improvement in the neuropathy, bt medicaiton is helping his RLS symptoms. Continue gabapentin.    8. Low-normal B-12. B12 level pending today.   Plan: The patient is currently on Arixtra 7.5 mg daily and tolerating this medication well.  He will continue on this medication.  He will continue to wear his compression stockings as needed.  He is currently on gabapentin which was prescribed for peripheral neuropathy.  He has not  noticed much improvement in the peripheral neuropathy but it is helping his restless leg symptoms at bedtime.  He will continue on gabapentin at the current dose.  He has a history of vitamin B12 deficiency.  B12 level is pending today.  He continues to smoke slightly less than 1 pack of cigarettes per day.  He is hoping to quit and is working on this.  He will follow-up with [place] in 6 months with repeat lab work including a CBC, CMP, vitamin B12 level.  He knows to call us if he has any questions or concerns prior to his next visit with [place].  Clenton Pare, NP  No orders of the defined types were placed in this encounter.    CHIEF COMPLAINT:  CC: History of DVT  Current Treatment:  Arixtra   HISTORY OF PRESENT ILLNESS:  History of recurrent deep venous thrombosis and episode of pulmonary emboli who has an IVC filter in place.  His 1st episode was in December of 2016 and it has always been the right lower extremity.  He was admitted earlier in January 2022 for recurrent DVT but we are not sure about his compliance with the Arixtra.  He presented with worsening deep venous thrombosis of the entire right lower extremity as well as the left with thrombosis of the inferior neck cava filter, with thrombus extending above the filter to the level of the renal vein.  He was having severe pain and 3 to  4+ edema of the right lower extremity, and was unable to stand or ambulate.  He was admitted for IV heparin and was then switched to Lovenox at 1 milligram/kilogram twice daily rather than 1.5 milligrams/kilogram once daily.  The Doppler confirmed extensive deep venous thrombosis throughout the right lower extremity which is extensive and occlusive.  He has seen a vascular surgeon in the past and has had a stent placed as well as the IVC filter at [facility].  A vascular surgeon was contacted when the patient was in the emergency room and did not have any specific recommendations at this time.  He has seen  vascular surgeons in the past.  I ordered oxycodone for the patient's pain and it does give him some relief.  He continues to smoke and was drinking alcohol heavily previously.  At some point, he should probably have removal of the IVC filter, but I feel it would be too risky at this time. He was discharged on January 29th.  His daughter has undergone genetic testing and does have a MTHFR, homozygous mutation.  I went through his records from Dr. Arlan Organ, and did not see that he was checked for hypercoagulable state, but he was negative for JAK2 mutation (polycythemia vera).  Factor IV Leiden was negative in March.  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  However, he was already on anticoagulant therapy at that time.  We will monitor this periodically.  He also has problems with hypertension and did have a COVID infection in February.   INTERVAL HISTORY:  Esai is here for routine follow up for his history of DVT. Patient states that he feels well but continues to have some pain to the right leg and intermittent swelling.  Uses compression stockings as needed when he knows he will be up on his feet most of the day.  Notices some itching in the right ankle area as well as skin discoloration.  He remains on Arixtra 7.5 mg daily and denies missing any doses.  He is not having any epistaxis, hemoptysis, hematemesis, hematuria, melena, hematochezia.  Denies chest pain and shortness of breath.  No cough.  He is taking gabapentin 1 tablet in the morning and 2 at bedtime.  He has not noticed a significant change in his neuropathy symptoms but does report improvement in restless leg symptoms.  He also notices it helps him sleep better.  REVIEW OF SYSTEMS:  Review of Systems  Constitutional: Negative.  Negative for appetite change, chills, diaphoresis, fatigue, fever and unexpected weight change.  HENT:  Negative.  Negative for hearing loss,  lump/mass, mouth sores, nosebleeds, sore throat, tinnitus, trouble swallowing and voice change.   Eyes: Negative.  Negative for eye problems and icterus.  Respiratory: Negative.  Negative for chest tightness, cough, hemoptysis, shortness of breath and wheezing.   Cardiovascular:  Positive for leg swelling (right, chronic). Negative for chest pain and palpitations.  Gastrointestinal: Negative.  Negative for abdominal distention, abdominal pain, blood in stool, constipation, diarrhea, nausea, rectal pain and vomiting.  Endocrine: Negative.  Negative for hot flashes.  Genitourinary: Negative.  Negative for bladder incontinence, difficulty urinating, dyspareunia, dysuria, frequency, hematuria, nocturia, pelvic pain and penile discharge.   Musculoskeletal:  Positive for arthralgias (right knee, chronic). Negative for back pain, flank pain, gait problem, myalgias, neck pain and neck stiffness.  Skin: Negative.  Negative for itching, rash and wound.  Neurological: Negative.  Negative for dizziness, extremity weakness, gait problem, headaches,  light-headedness, numbness, seizures and speech difficulty.       Chronic peripheral neuropathy.   Hematological: Negative.  Negative for adenopathy. Does not bruise/bleed easily.  Psychiatric/Behavioral: Negative.  Negative for confusion, decreased concentration, depression, sleep disturbance and suicidal ideas. The patient is not nervous/anxious.   All other systems reviewed and are negative.    VITALS:  Blood pressure 126/81, pulse 60, temperature 98.4 F (36.9 C), temperature source Oral, resp. rate 18, height 5\' 8"  (1.727 m), weight 82.6 kg, SpO2 99%.  Wt Readings from Last 3 Encounters:  02/10/23 82.6 kg  11/17/22 85.4 kg  09/01/22 81.6 kg    Body mass index is 27.69 kg/m.  Performance status (ECOG): 1 - Symptomatic but completely ambulatory  PHYSICAL EXAM:  Physical Exam Vitals and nursing note reviewed.  Constitutional:      General: He is not  in acute distress.    Appearance: Normal appearance. He is normal weight. He is not ill-appearing, toxic-appearing or diaphoretic.  HENT:     Head: Normocephalic and atraumatic.     Right Ear: Tympanic membrane, ear canal and external ear normal. There is no impacted cerumen.     Left Ear: Tympanic membrane, ear canal and external ear normal. There is no impacted cerumen.     Nose: Nose normal. No congestion or rhinorrhea.     Mouth/Throat:     Mouth: Mucous membranes are moist.     Pharynx: Oropharynx is clear. No oropharyngeal exudate or posterior oropharyngeal erythema.  Eyes:     General: No scleral icterus.       Right eye: No discharge.        Left eye: No discharge.     Extraocular Movements: Extraocular movements intact.     Conjunctiva/sclera: Conjunctivae normal.     Pupils: Pupils are equal, round, and reactive to light.  Neck:     Vascular: No carotid bruit.  Cardiovascular:     Rate and Rhythm: Regular rhythm.     Pulses: Normal pulses.     Heart sounds: Normal heart sounds. No murmur heard.    No friction rub. No gallop.  Pulmonary:     Effort: Pulmonary effort is normal. No respiratory distress.     Breath sounds: Normal breath sounds. No stridor. No wheezing, rhonchi or rales.  Chest:     Chest wall: No tenderness.  Abdominal:     General: Bowel sounds are normal. There is no distension.     Palpations: Abdomen is soft. There is no hepatomegaly, splenomegaly or mass.     Tenderness: There is no abdominal tenderness. There is no right CVA tenderness, left CVA tenderness, guarding or rebound.     Hernia: No hernia is present.  Musculoskeletal:        General: No swelling, tenderness, deformity or signs of injury. Normal range of motion.     Cervical back: Normal range of motion and neck supple. No rigidity or tenderness.     Right lower leg: 1+ Edema present.     Left lower leg: No edema.     Comments: Edema hyperpigmentation on the right leg with firmness of the  right calf.   Lymphadenopathy:     Cervical: No cervical adenopathy.     Upper Body:     Right upper body: No supraclavicular or axillary adenopathy.     Left upper body: No supraclavicular or axillary adenopathy.     Lower Body: No right inguinal adenopathy. No left inguinal adenopathy.  Skin:  General: Skin is warm and dry.     Coloration: Skin is not jaundiced or pale.     Findings: No bruising, erythema, lesion or rash.  Neurological:     General: No focal deficit present.     Mental Status: He is alert and oriented to person, place, and time. Mental status is at baseline.     Cranial Nerves: No cranial nerve deficit.     Sensory: No sensory deficit.     Motor: No weakness.     Coordination: Coordination normal.     Gait: Gait normal.     Deep Tendon Reflexes: Reflexes normal.  Psychiatric:        Mood and Affect: Mood normal.        Behavior: Behavior normal.        Thought Content: Thought content normal.        Judgment: Judgment normal.     LABS:   Component Ref Range & Units (02/10/22)  WBC Count 4.0 - 10.5 K/uL 5.8  RBC 4.22 - 5.81 MIL/uL 4.30  Hemoglobin 13.0 - 17.0 g/dL 78.4  HCT 69.6 - 29.5 % 41.8  MCV 80.0 - 100.0 fL 97.2  MCH 26.0 - 34.0 pg 31.6  MCHC 30.0 - 36.0 g/dL 28.4  RDW 13.2 - 44.0 % 13.2  Platelet Count 150 - 400 K/uL 156   Component Ref Range & Units 08/08/2022   Sodium 136 - 145 mmol/L 143  Potassium 3.5 - 5.1 mmol/L 5.0  Chloride 98 - 107 mmol/L 105  CO2 21 - 31 mmol/L 29  Anion Gap 6 - 14 mmol/L 9  Glucose, Random 70 - 99 mg/dL 102 High   Blood Urea Nitrogen (BUN) 7 - 25 mg/dL 15  Creatinine 7.25 - 3.66 mg/dL 4.40  eGFR >34 VQ/QVZ/5.63O7 71  Albumin 3.5 - 5.7 g/dL 4.8  Total Protein 6.4 - 8.9 g/dL 6.7  Bilirubin, Total 0.3 - 1.0 mg/dL 0.7  Alkaline Phosphatase (ALP) 34 - 104 U/L 86  Aspartate Aminotransferase (AST) 13 - 39 U/L 26  Alanine Aminotransferase (ALT) 7 - 52 U/L 36  Calcium 8.6 - 10.3 mg/dL 9.2    Component Ref Range & Units 08/08/2022  PSA, Total 0.00 - 3.50 ng/mL 0.53   Component Ref Range & Units 08/08/2022  Cholesterol, Total, Lipid Panel <200 mg/dL 564  Triglycerides, Lipid Panel <150 mg/dL 332  HDL Cholesterol - Lipid Panel >=60 mg/dL 40 Low   LDL Cholesterol, Calculated <100 mg/dL 55  Non-HDL Cholesterol mg/dL 74   Component Ref Range & Units 6 mo ago (02/10/22) 1 yr ago (06/03/21) 1 yr ago (02/10/21)  Vitamin B-12 180 - 914 pg/mL 247 286 CM 299 CM      Latest Ref Rng & Units 02/10/2023    8:35 AM 08/11/2022    8:50 AM 02/10/2022    9:24 AM  CBC  WBC 4.0 - 10.5 K/uL 6.7  6.5  5.8   Hemoglobin 13.0 - 17.0 g/dL 95.1  88.4  16.6   Hematocrit 39.0 - 52.0 % 43.5  42.9  41.8   Platelets 150 - 400 K/uL 179  162  156       Latest Ref Rng & Units 02/10/2023    8:35 AM 08/11/2022    8:50 AM 06/03/2021   12:00 AM  CMP  Glucose 70 - 99 mg/dL 063  016    BUN 6 - 20 mg/dL 18  15  18       Creatinine 0.61 - 1.24 mg/dL 0.10  1.05  1.3      Sodium 135 - 145 mmol/L 144  146  144      Potassium 3.5 - 5.1 mmol/L 4.5  5.0  3.6      Chloride 98 - 111 mmol/L 104  111  104      CO2 22 - 32 mmol/L 29  27  30       Calcium 8.9 - 10.3 mg/dL 9.4  8.8  9.2      Total Protein 6.5 - 8.1 g/dL 7.4  6.8    Total Bilirubin 0.0 - 1.2 mg/dL 0.7  0.8    Alkaline Phos 38 - 126 U/L 100  76  89      AST 15 - 41 U/L 21  30  23       ALT 0 - 44 U/L 14  41  20         This result is from an external source.    Lab Results  Component Value Date   TIBC 275 01/28/2020   TIBC 185 (L) 05/01/2018   TIBC 289 10/09/2017   FERRITIN 75 01/28/2020   FERRITIN 245 05/01/2018   FERRITIN 182 10/09/2017   IRONPCTSAT 17 (L) 01/28/2020   IRONPCTSAT 23 05/01/2018   IRONPCTSAT 80 10/09/2017   Lab Results  Component Value Date   LDH 188 10/09/2017   LDH 183 08/15/2017   LDH 210 07/04/2017    STUDIES:     HISTORY:   Allergies: No Known Allergies  Current Medications: Current Outpatient  Medications  Medication Sig Dispense Refill   albuterol (VENTOLIN HFA) 108 (90 Base) MCG/ACT inhaler Inhale 2 puffs into the lungs every 4 (four) hours as needed for wheezing or shortness of breath.     atorvastatin (LIPITOR) 80 MG tablet Take 80 mg by mouth daily.     buPROPion (WELLBUTRIN SR) 100 MG 12 hr tablet Take 100 mg by mouth every morning.     EPINEPHrine (EPIPEN 2-PAK) 0.3 mg/0.3 mL IJ SOAJ injection Inject 0.3 mg into the muscle as needed for anaphylaxis.     escitalopram (LEXAPRO) 10 MG tablet TAKE ONE TABLET BY MOUTH EVERY EVENING (Patient taking differently: Take 10 mg by mouth at bedtime.) 90 tablet 2   ezetimibe (ZETIA) 10 MG tablet Take 10 mg by mouth daily.     folic acid (FOLVITE) 1 MG tablet TAKE ONE TABLET BY MOUTH EVERY EVENING 90 tablet 6   fondaparinux (ARIXTRA) 7.5 MG/0.6ML SOLN injection Inject 0.6 mLs (7.5 mg total) into the skin daily. 90 mL 1   gabapentin (NEURONTIN) 300 MG capsule TAKE ONE CAPSULE BY MOUTH EVERY DAY and TAKE 2 CAPSULES BY MOUTH AT BEDTIME 90 capsule 5   magnesium oxide (MAG-OX) 400 MG tablet Take 400 mg by mouth daily.     mirtazapine (REMERON) 45 MG tablet Take 45 mg by mouth at bedtime.     montelukast (SINGULAIR) 10 MG tablet Take 10 mg by mouth at bedtime.     omeprazole (PRILOSEC) 40 MG capsule Take 40 mg by mouth daily.     tamsulosin (FLOMAX) 0.4 MG CAPS capsule TAKE ONE CAPSULE BY MOUTH EVERY EVENING 30 capsule 2   No current facility-administered medications for this visit.

## 2023-03-16 ENCOUNTER — Other Ambulatory Visit: Payer: Self-pay | Admitting: Hematology & Oncology

## 2023-03-16 DIAGNOSIS — I824Z2 Acute embolism and thrombosis of unspecified deep veins of left distal lower extremity: Secondary | ICD-10-CM

## 2023-03-16 DIAGNOSIS — I82413 Acute embolism and thrombosis of femoral vein, bilateral: Secondary | ICD-10-CM

## 2023-04-28 ENCOUNTER — Other Ambulatory Visit: Payer: Self-pay | Admitting: Hematology & Oncology

## 2023-04-28 DIAGNOSIS — F419 Anxiety disorder, unspecified: Secondary | ICD-10-CM

## 2023-06-13 ENCOUNTER — Other Ambulatory Visit: Payer: Self-pay | Admitting: Hematology & Oncology

## 2023-06-13 DIAGNOSIS — I824Z2 Acute embolism and thrombosis of unspecified deep veins of left distal lower extremity: Secondary | ICD-10-CM

## 2023-06-13 DIAGNOSIS — I82413 Acute embolism and thrombosis of femoral vein, bilateral: Secondary | ICD-10-CM

## 2023-06-23 ENCOUNTER — Encounter: Payer: Self-pay | Admitting: Family

## 2023-06-23 ENCOUNTER — Ambulatory Visit (HOSPITAL_BASED_OUTPATIENT_CLINIC_OR_DEPARTMENT_OTHER)
Admission: EM | Admit: 2023-06-23 | Discharge: 2023-06-23 | Disposition: A | Discharge status: Home / Self Care | Attending: Family Medicine | Admitting: Family Medicine

## 2023-06-23 ENCOUNTER — Encounter (HOSPITAL_BASED_OUTPATIENT_CLINIC_OR_DEPARTMENT_OTHER): Payer: Self-pay

## 2023-06-23 ENCOUNTER — Other Ambulatory Visit (HOSPITAL_BASED_OUTPATIENT_CLINIC_OR_DEPARTMENT_OTHER): Payer: Self-pay

## 2023-06-23 DIAGNOSIS — L308 Other specified dermatitis: Secondary | ICD-10-CM | POA: Diagnosis not present

## 2023-06-23 DIAGNOSIS — B880 Other acariasis: Secondary | ICD-10-CM | POA: Diagnosis not present

## 2023-06-23 MED ORDER — PERMETHRIN 5 % EX CREA
TOPICAL_CREAM | CUTANEOUS | 0 refills | Status: AC
  Filled 2023-06-23: qty 240, fill #0

## 2023-06-23 MED ORDER — METHYLPREDNISOLONE ACETATE 80 MG/ML IJ SUSP
80.0000 mg | Freq: Once | INTRAMUSCULAR | Status: AC
  Administered 2023-06-23: 80 mg via INTRAMUSCULAR

## 2023-06-23 NOTE — Discharge Instructions (Addendum)
 Itchy rash and possible mite infestation: Depo-Medrol 80 mg injection now (this is a steroid shot).  This rash could be chiggers or mites.  Launder sheets in your bed and hot water and put those fresh sheets on your bed.  Launder anything you sleep in and hot water and have it fresh.  Apply permethrin lotion, apply from neck to toes.  Leave this lotion on all night long.  Shower the lotion off in the morning.  Anything you were during the night and the sheets on the bed need to be longer to get in very hot water.  Any clothing may have formed prior to this treatment should be monitored.  If rash does not completely resolve within 1 to 2 weeks see dermatology as planned.  Continue Claritin/loratadine or Zyrtec/cetirizine, 10 mg, once or twice daily for itching.  Follow-up with dermatology.

## 2023-06-23 NOTE — ED Triage Notes (Signed)
 States bilateral lower extremity itching x 1 month. Has been seen twice. Given steroids, claritin with no improvement. To see dermatologist on 6/12.

## 2023-06-23 NOTE — ED Provider Notes (Signed)
 [facility]    CSN: [id number] Arrival date & time: 06/23/23  1230      History   Chief Complaint Chief Complaint  Patient presents with   Pruritis    HPI Aaron Cunningham is a 58 y.o. male.   Patient reports bilateral lower leg small macular papular rash that has been itchy for a month.  Is only on his legs.  He does not remember it on his feet.  It stops at his knees.  He has had oral steroids and antibiotics from his family practice physician.  Since he finished the steroids he was itching again.  He has tried Claritin without relief.  He has an appointment with dermatology on 06/29/2023.     Past Medical History:  Diagnosis Date   Acute embolism and thrombosis of deep vein of left distal lower extremity (HCC) 03/11/2015   Alcohol withdrawal syndrome with complication (HCC) 11/02/2015   Formatting of this note might be different from the original.  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  04/30/2020     Bronchiolitis 02/17/2014   Chronic bronchitis with acute exacerbation (HCC) 08/14/2017   Formatting of this note might be different from the original.  07/05/2017: ED eval  2020: [facility] of this note might be different from the original.  2017-07-05: ED eval  2020: Hosp  04/20/2021     CKD (chronic kidney disease) 11/02/2015   Claudication of lower extremity with history of revascularization (HCC) 06/17/2021   06/17/2021: right     Coronary artery disease involving native coronary artery of native heart without angina pectoris 02/25/2022   02/25/2022: incidental on CT chest, severe 3V calcification     Current every day smoker 08/08/2022   Demand ischemia (HCC) 01/09/2015   DVT (deep venous thrombosis) (HCC)    DVT, lower extremity (HCC) 10/14/2014    Emphysema of lung (HCC) 07/18/2018   Formatting of this note might be different from the original.  07/06/2018: stopped cigs, very severe obs on spirometry     Essential hypertension 10/06/2017   Family history of premature coronary artery disease 12/05/2017   Formatting of this note might be different from the original.  father     Generalized anxiety disorder 09/28/2015   GERD (gastroesophageal reflux disease)    Hyperlipidemia    Kidney stones 11/02/2015   Lumbar back pain with radiculopathy affecting left lower extremity 05/17/2018   Formatting of this note might be different from the original.  07-06-18: bilateral L5     Moderate episode of recurrent major depressive disorder (HCC) 12/05/2017   Formatting of this note might be different from the original.  2014/07/06: wife deceased  07-05-2017:     Neuropathy 12/20/2021   12/20/2021: soles, recent onset     Non-recurrent unilateral inguinal hernia without obstruction or gangrene 06/08/2020   Formatting of this note might be different from the original.  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   Pneumomediastinum (HCC) 03/28/2018   Pneumonia 02/17/2014   PVD (peripheral vascular disease) (HCC) 01/09/2015   Right iliac stent 07-05-2005   Saddle embolus of pulmonary artery without acute cor pulmonale (HCC)  03/11/2015   Thrombocytopenia (HCC) 01/09/2015   Wide-complex tachycardia 01/09/2015    Patient Active Problem List   Diagnosis Date Noted   Current every day smoker 08/08/2022   Coronary artery disease involving native coronary artery of native heart without angina pectoris 02/25/2022   Neuropathy 12/20/2021   Muscle cramping 07/06/2021   Claudication of lower extremity with history of revascularization (HCC) 06/17/2021   Leg cramping 06/03/2021   Hip pain 06/03/2021   History of esophageal stricture 03/03/2021   Non-recurrent unilateral inguinal hernia  without obstruction or gangrene 06/08/2020   Pre-procedural general physical examination 05/12/2020   Bilateral lower extremity edema 04/30/2020   Diarrhea of presumed infectious origin 04/29/2020   History of acute renal failure 04/29/2020   Alcohol use 03/12/2020   B12 deficiency anemia 02/20/2020    Class: Diagnosis of   Hypomagnesemia 11/19/2019   History of alcohol use disorder 11/19/2019   Hypokalemia 11/14/2019   COVID-19 virus infection 03/25/2019   Emphysema of lung (HCC) 07/18/2018   Lumbar back pain with radiculopathy affecting left lower extremity 05/17/2018   Increased frequency of urination 04/02/2018   Esophageal rupture 03/28/2018   Pneumomediastinum (HCC) 03/28/2018   Family history of premature coronary artery disease 12/05/2017   Moderate episode of recurrent major depressive disorder (HCC) 12/05/2017   Essential hypertension 10/06/2017   Chronic bronchitis with acute exacerbation (HCC) 08/14/2017   Viral respiratory illness 02/09/2017   Alcohol withdrawal syndrome with complication (HCC) 11/02/2015   CKD (chronic kidney disease) 11/02/2015   GERD (gastroesophageal reflux disease) 11/02/2015   Acute kidney injury (HCC) 11/02/2015   History of deep venous thrombosis 11/02/2015   History of pulmonary embolism 11/02/2015   History of alcohol withdrawal syndrome 11/02/2015   Kidney stones 11/02/2015   Generalized anxiety disorder 09/28/2015   Elevated hemoglobin (HCC) 08/27/2015   Tobacco abuse 03/11/2015   Esophagitis, reflux 03/11/2015   Polyneuropathy, 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   Lump in the rectum 03/11/2015   Cannot sleep 03/11/2015   HLD (hyperlipidemia) 03/11/2015   Hematuria 03/11/2015   Polycythemia 03/11/2015   Atypical chest pain 03/11/2015   Bronchitis 03/11/2015   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) 03/11/2015   Anxiety 03/11/2015   Long term current use of anticoagulant 03/11/2015   Saddle embolus of pulmonary artery without  acute cor pulmonale (HCC) 03/11/2015   Acute embolism and thrombosis of deep vein of left distal lower extremity (HCC) 03/11/2015   Thrombocytopenia (HCC) 01/09/2015   PVD (peripheral vascular disease) (HCC) 01/09/2015   Wide-complex tachycardia 01/09/2015   Demand ischemia (HCC) 01/09/2015   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   TONSILLECTOMY         Home Medications    Prior to Admission medications   Medication Sig Start Date End Date Taking? Authorizing Provider  permethrin (ELIMITE) 5 % cream Apply from chin to toes, leave on for 8 hours and then shower off. 06/23/23  Yes Guss Legacy, FNP  albuterol (VENTOLIN HFA) 108 (  90 Base) MCG/ACT inhaler Inhale 2 puffs into the lungs every 4 (four) hours as needed for wheezing or shortness of breath. 04/06/21   [provider]  atorvastatin (LIPITOR) 80 MG tablet Take 80 mg by mouth daily. 06/18/21   [provider]  buPROPion (WELLBUTRIN SR) 100 MG 12 hr tablet Take 100 mg by mouth every morning. 02/27/20   [provider]  EPINEPHrine (EPIPEN 2-PAK) 0.3 mg/0.3 mL IJ SOAJ injection Inject 0.3 mg into the muscle as needed for anaphylaxis. 08/15/18   [provider]  escitalopram  (LEXAPRO ) 10 MG tablet TAKE ONE TABLET BY MOUTH EVERY EVENING 04/28/23   Ivor Mars, MD  ezetimibe (ZETIA) 10 MG tablet Take 10 mg by mouth daily.    [provider]  folic acid  (FOLVITE ) 1 MG tablet TAKE ONE TABLET BY MOUTH EVERY EVENING 01/30/23    Ivor Mars, MD  fondaparinux  (ARIXTRA ) 7.5 MG/0.6ML SOLN injection Inject 0.6 mLs (7.5 mg total) into the skin daily. 12/14/22   Mosher, Kelli A, PA-C  gabapentin  (NEURONTIN ) 300 MG capsule TAKE ONE CAPSULE BY MOUTH EVERY DAY and TAKE 2 CAPSULES BY MOUTH AT BEDTIME 01/11/23   Nolia Baumgartner, MD  magnesium  oxide (MAG-OX) 400 MG tablet Take 400 mg by mouth daily. 07/08/21   [provider]  mirtazapine (REMERON) 45 MG tablet Take 45 mg by mouth at bedtime. 08/04/20   [provider]  montelukast (SINGULAIR) 10 MG tablet Take 10 mg by mouth at bedtime. 06/08/20   [provider]  omeprazole (PRILOSEC) 40 MG capsule Take 40 mg by mouth daily. 05/12/21   [provider]  tamsulosin  (FLOMAX ) 0.4 MG CAPS capsule TAKE ONE CAPSULE BY MOUTH EVERY EVENING 06/13/23   Ivor Mars, MD    Family History Family History  Problem Relation Age of Onset   Cirrhosis Mother    Deep vein thrombosis Father    Heart disease Father 57       Before age 40   Hyperlipidemia Father    Heart attack Father    Stroke Father    Coronary artery disease Father     Social History Social History   Tobacco Use   Smoking status: Heavy Smoker    Current packs/day: 1.00    Average packs/day: 1 pack/day for 30.0 years (30.0 ttl pk-yrs)    Types: Cigarettes    Passive exposure: Current   Smokeless tobacco: Former   Tobacco comments:    Teacher, 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   Review of Systems Review of Systems  Constitutional:  Negative for fever.  Respiratory:  Negative for cough.   Cardiovascular:  Negative for chest pain.  Gastrointestinal:  Negative for abdominal pain, constipation, diarrhea, nausea and vomiting.  Musculoskeletal:  Negative for arthralgias and back pain.  Skin:  Positive for rash (Itchy rash on bilateral lower legs.).  Negative for color change.  Neurological:  Negative for syncope.  All other systems reviewed and are negative.    Physical Exam Triage Vital Signs ED Triage Vitals  Encounter Vitals Group     BP 06/23/23 1304 114/75     Systolic BP Percentile --      Diastolic BP Percentile --      Pulse Rate 06/23/23 1304 69     Resp 06/23/23 1304 20  Temp 06/23/23 1304 98.5 F (36.9 C)     Temp Source 06/23/23 1304 Oral     SpO2 06/23/23 1304 96 %     Weight --      Height --      Head Circumference --      Peak Flow --      Pain Score 06/23/23 1306 0     Pain Loc --      Pain Education --      Exclude from Growth Chart --    No data found.  Updated Vital Signs BP 114/75 (BP Location: Right Arm)   Pulse 69   Temp 98.5 F (36.9 C) (Oral)   Resp 20   SpO2 96%   Visual Acuity Right Eye Distance:   Left Eye Distance:   Bilateral Distance:    Right Eye Near:   Left Eye Near:    Bilateral Near:     Physical Exam Vitals and nursing note reviewed.  Constitutional:      General: He is not in acute distress.    Appearance: He is well-developed. He is not ill-appearing or toxic-appearing.  HENT:     Head: Normocephalic and atraumatic.     Right Ear: External ear normal.     Left Ear: External ear normal.     Nose: Nose normal.     Mouth/Throat:     Lips: Pink.     Mouth: Mucous membranes are moist.  Eyes:     Conjunctiva/sclera: Conjunctivae normal.     Pupils: Pupils are equal, round, and reactive to light.  Cardiovascular:     Rate and Rhythm: Normal rate and regular rhythm.     Heart sounds: S1 normal and S2 normal. No murmur heard. Pulmonary:     Effort: Pulmonary effort is normal. No respiratory distress.     Breath sounds: Normal breath sounds. No decreased breath sounds, wheezing, rhonchi or rales.  Musculoskeletal:        General: No swelling.  Skin:    General: Skin is warm and dry.     Capillary Refill: Capillary refill takes less than 2 seconds.      Findings: No rash (Maculopapular rash on bilateral lower legs from ankles to knees.  It seems a somewhat linear pattern.  He has clearly been scratching it a lot.  See photos for more detail).  Neurological:     Mental Status: He is alert and oriented to person, place, and time.  Psychiatric:        Mood and Affect: Mood normal.      UC Treatments / Results  Labs (all labs ordered are listed, but only abnormal results are displayed) Labs Reviewed - No data to display  EKG   Radiology No results found.  Procedures Procedures (including critical care time)  Medications Ordered in UC Medications  methylPREDNISolone acetate (DEPO-MEDROL) injection 80 mg (80 mg Intramuscular Given 06/23/23 1407)    Initial Impression / Assessment and Plan / UC Course  I have reviewed the triage vital signs and the nursing notes.  Pertinent labs & imaging results that were available during my care of the patient were reviewed by me and considered in my medical decision making (see chart for details).  Plan of Care: Pruritic rash bilateral lower legs and possible mite infestation: Will provide Depo-Medrol, 80 mg IM now.  Will treat with Elimite or permethrin cream, 1 time.  See discharge instructions for specific directions on laundering linens, clothes and applying the permethrin  cream.  Follow-up with dermatology on 06/29/2023 as planned.  Return here if needed.  I reviewed the plan of care with the patient and/or the patient's guardian.  The patient and/or guardian had time to ask questions and acknowledged that the questions were answered.  I provided instruction on symptoms or reasons to return here or to go to an ER, if symptoms/condition did not improve, worsened or if new symptoms occurred.  Final Clinical Impressions(s) / UC Diagnoses   Final diagnoses:  Pruritic dermatitis  Chiggers (mites)     Discharge Instructions      Itchy rash and possible mite infestation: Depo-Medrol 80 mg  injection now (this is a steroid shot).  This rash could be chiggers or mites.  Launder sheets in your bed and hot water and put those fresh sheets on your bed.  Launder anything you sleep in and hot water and have it fresh.  Apply permethrin lotion, apply from neck to toes.  Leave this lotion on all night long.  Shower the lotion off in the morning.  Anything you were during the night and the sheets on the bed need to be longer to get in very hot water.  Any clothing may have formed prior to this treatment should be monitored.  If rash does not completely resolve within 1 to 2 weeks see dermatology as planned.  Continue Claritin/loratadine or Zyrtec/cetirizine, 10 mg, once or twice daily for itching.  Follow-up with dermatology.   ED Prescriptions     Medication Sig Dispense Auth. Provider   permethrin (ELIMITE) 5 % cream Apply from chin to toes, leave on for 8 hours and then shower off. 240 g Guss Legacy, FNP      PDMP not reviewed this encounter.   Guss Legacy, FNP 06/23/23 [phone]

## 2023-06-26 ENCOUNTER — Other Ambulatory Visit (HOSPITAL_BASED_OUTPATIENT_CLINIC_OR_DEPARTMENT_OTHER): Payer: Self-pay

## 2023-07-11 ENCOUNTER — Other Ambulatory Visit: Payer: Self-pay | Admitting: Oncology

## 2023-07-11 DIAGNOSIS — G629 Polyneuropathy, unspecified: Secondary | ICD-10-CM

## 2023-08-10 ENCOUNTER — Inpatient Hospital Stay: Payer: Medicare PPO | Attending: Oncology | Admitting: Hematology and Oncology

## 2023-08-10 ENCOUNTER — Inpatient Hospital Stay: Payer: Medicare PPO

## 2023-08-10 ENCOUNTER — Other Ambulatory Visit: Payer: Self-pay

## 2023-08-10 ENCOUNTER — Telehealth: Payer: Self-pay | Admitting: Oncology

## 2023-08-10 VITALS — BP 143/87 | HR 55 | Temp 97.9°F | Resp 16 | Ht 68.0 in | Wt 168.5 lb

## 2023-08-10 DIAGNOSIS — Z86718 Personal history of other venous thrombosis and embolism: Secondary | ICD-10-CM | POA: Diagnosis present

## 2023-08-10 DIAGNOSIS — G629 Polyneuropathy, unspecified: Secondary | ICD-10-CM | POA: Insufficient documentation

## 2023-08-10 DIAGNOSIS — D510 Vitamin B12 deficiency anemia due to intrinsic factor deficiency: Secondary | ICD-10-CM

## 2023-08-10 DIAGNOSIS — F17210 Nicotine dependence, cigarettes, uncomplicated: Secondary | ICD-10-CM | POA: Diagnosis not present

## 2023-08-10 DIAGNOSIS — I10 Essential (primary) hypertension: Secondary | ICD-10-CM | POA: Insufficient documentation

## 2023-08-10 DIAGNOSIS — Z7901 Long term (current) use of anticoagulants: Secondary | ICD-10-CM | POA: Diagnosis not present

## 2023-08-10 DIAGNOSIS — Z79899 Other long term (current) drug therapy: Secondary | ICD-10-CM | POA: Insufficient documentation

## 2023-08-10 DIAGNOSIS — I825Z1 Chronic embolism and thrombosis of unspecified deep veins of right distal lower extremity: Secondary | ICD-10-CM

## 2023-08-10 DIAGNOSIS — Z86711 Personal history of pulmonary embolism: Secondary | ICD-10-CM | POA: Diagnosis present

## 2023-08-10 LAB — CBC WITH DIFFERENTIAL (CANCER CENTER ONLY)
Abs Immature Granulocytes: 0.02 K/uL (ref 0.00–0.07)
Basophils Absolute: 0 K/uL (ref 0.0–0.1)
Basophils Relative: 0 %
Eosinophils Absolute: 0.1 K/uL (ref 0.0–0.5)
Eosinophils Relative: 2 %
HCT: 43.2 % (ref 39.0–52.0)
Hemoglobin: 14.2 g/dL (ref 13.0–17.0)
Immature Granulocytes: 0 %
Lymphocytes Relative: 33 %
Lymphs Abs: 2 K/uL (ref 0.7–4.0)
MCH: 32.4 pg (ref 26.0–34.0)
MCHC: 32.9 g/dL (ref 30.0–36.0)
MCV: 98.6 fL (ref 80.0–100.0)
Monocytes Absolute: 0.4 K/uL (ref 0.1–1.0)
Monocytes Relative: 6 %
Neutro Abs: 3.6 K/uL (ref 1.7–7.7)
Neutrophils Relative %: 59 %
Platelet Count: 190 K/uL (ref 150–400)
RBC: 4.38 MIL/uL (ref 4.22–5.81)
RDW: 13.9 % (ref 11.5–15.5)
WBC Count: 6.1 K/uL (ref 4.0–10.5)
nRBC: 0 % (ref 0.0–0.2)

## 2023-08-10 LAB — CMP (CANCER CENTER ONLY)
ALT: 36 U/L (ref 0–44)
AST: 32 U/L (ref 15–41)
Albumin: 4.4 g/dL (ref 3.5–5.0)
Alkaline Phosphatase: 82 U/L (ref 38–126)
Anion gap: 12 (ref 5–15)
BUN: 17 mg/dL (ref 6–20)
CO2: 26 mmol/L (ref 22–32)
Calcium: 9.1 mg/dL (ref 8.9–10.3)
Chloride: 105 mmol/L (ref 98–111)
Creatinine: 1.36 mg/dL — ABNORMAL HIGH (ref 0.61–1.24)
GFR, Estimated: >60 mL/min (ref >60)
Glucose, Bld: 134 mg/dL — ABNORMAL HIGH (ref 70–99)
Potassium: 4.9 mmol/L (ref 3.5–5.1)
Sodium: 143 mmol/L (ref 135–145)
Total Bilirubin: 0.6 mg/dL (ref 0.0–1.2)
Total Protein: 6.9 g/dL (ref 6.5–8.1)

## 2023-08-10 LAB — VITAMIN B12: Vitamin B-12: 1279 pg/mL — ABNORMAL HIGH (ref 180–914)

## 2023-08-10 NOTE — Telephone Encounter (Signed)
 Patient has been scheduled for follow-up visit per 08/09/23 LOS.  Pt aware of scheduled appt details.

## 2023-08-10 NOTE — Progress Notes (Signed)
 [name],  [place]  [address] [phone]  Clinic Day:  02/10/23   Referring physician: Ofilia Lamar CROME, MD  ASSESSMENT & PLAN:   Assessment & Plan: 1.  Multiple recurrences of deep venous thrombosis, despite being on low-molecular weight heparin . He has been seen by Dr. Maude Cunningham in the past, and was negative for polycythemia vera. He was changed from Lovenox  at 120 mg (1 mg/kg) to Arixtra  injections 7.5mg  (total) daily and is tolerating this well.  Continue Arixtra  indefinitely.     2.  Episode of pulmonary emboli.   3. Extensive thrombosis of the right lower extremity throughout the leg and occlusive thrombus in the left common femoral vein as well.  This has been debilitating and painful.  He was given Tramadol  without much relief, and so has been using Tylenol .  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  He has clot in the inferior vena cava and surrounding his IVC filter so is at extremely high risk for complications.   4. Tobacco abuse.  He has smoked 1 pack per day for at least 30 years. Trying to cut back and he hopes to quit soon. He has quit smoking.    5. Alcohol abuse.  He has stopped drinking alcohol since January 2022.   6. Hypertension, improved. He will follow-up with Dr. Ofilia.  7. Peripheral Neuropathy. This is part of his chronic pain and may be related to his prior alcohol abuse. On gabapentin . Does not notice much improvement in the neuropathy, bt medicaiton is helping his RLS symptoms. I advised him to increase his nighttime dose to 3 capsules.    8. Low-normal B-12. B12 level pending today.   Plan: The patient is currently on Arixtra  7.5 mg daily and tolerating this medication well.  He will continue on this medication.  He will continue to wear his compression stockings as needed.  He is currently on  gabapentin  which was prescribed for peripheral neuropathy.  He has not noticed much improvement in the peripheral neuropathy but it is helping his restless leg symptoms at bedtime.  He will continue on gabapentin  at 3 capsules at night.  He has a history of vitamin B12 deficiency.  B12 level is pending today.  He continues to smoke slightly less than 1 pack of cigarettes per day.  He is hoping to quit and is working on this. He has quit smoking.  He will follow-up with us  in 6 months with repeat lab work including a CBC, CMP, vitamin B12 level.  He knows to call us  if he has any questions or concerns prior to his next visit with us .  Aaron Aaron Bach, NP  No orders of the defined types were placed in this encounter.    CHIEF COMPLAINT:  CC: History of DVT  Current Treatment:  Arixtra    HISTORY OF PRESENT ILLNESS:  History of recurrent deep venous thrombosis and episode of pulmonary emboli who has an IVC filter in place.  His 1st episode was in December of 2016 and it has always been the right lower extremity.  He was admitted earlier in January 2022 for recurrent DVT but we are not sure about his compliance with the Arixtra .  He presented with worsening deep venous thrombosis of the entire right lower extremity as well as the left with thrombosis of the inferior neck cava filter, with thrombus  extending above the filter to the level of the renal vein.  He was having severe pain and 3 to 4+ edema of the right lower extremity, and was unable to stand or ambulate.  He was admitted for IV heparin  and was then switched to Lovenox  at 1 milligram/kilogram twice daily rather than 1.5 milligrams/kilogram once daily.  The Doppler confirmed extensive deep venous thrombosis throughout the right lower extremity which is extensive and occlusive.  He has seen a vascular surgeon in the past and has had a stent placed as well as the IVC filter at [facility].  A vascular surgeon was contacted when the patient  was in the emergency room and did not have any specific recommendations at this time.  He has seen vascular surgeons in the past.  I ordered oxycodone  for the patient's pain and it does give him some relief.  He continues to smoke and was drinking alcohol heavily previously.  At some point, he should probably have removal of the IVC filter, but I feel it would be too risky at this time. He was discharged on January 29th.  His daughter has undergone genetic testing and does have a MTHFR, homozygous mutation.  I went through his records from Dr. Maude Cunningham, and did not see that he was checked for hypercoagulable state, but he was negative for JAK2 mutation (polycythemia vera).  Factor IV Leiden was negative in March.  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  However, he was already on anticoagulant therapy at that time.  We will monitor this periodically.  He also has problems with hypertension and did have a COVID infection in February.   INTERVAL HISTORY:  Aaron Cunningham is here for routine follow up for his history of DVT. Patient states that he feels well but continues to have some pain to the right leg and intermittent swelling.  Uses compression stockings as needed when he knows he will be up on his feet most of the day.  Notices some itching in the right ankle area as well as skin discoloration.  He remains on Arixtra  7.5 mg daily and denies missing any doses.  He is not having any epistaxis, hemoptysis, hematemesis, hematuria, melena, hematochezia.  Denies chest pain and shortness of breath.  No cough.  He is taking gabapentin  1 tablet in the morning and 2 at bedtime.  He has not noticed a significant change in his neuropathy symptoms but does report improvement in restless leg symptoms.  He also notices it helps him sleep better.  REVIEW OF SYSTEMS:  Review of Systems  Constitutional: Negative.  Negative for appetite change, chills, diaphoresis,  fatigue, fever and unexpected weight change.  HENT:  Negative.  Negative for hearing loss, lump/mass, mouth sores, nosebleeds, sore throat, tinnitus, trouble swallowing and voice change.   Eyes: Negative.  Negative for eye problems and icterus.  Respiratory: Negative.  Negative for chest tightness, cough, hemoptysis, shortness of breath and wheezing.   Cardiovascular:  Positive for leg swelling (right, chronic). Negative for chest pain and palpitations.  Gastrointestinal: Negative.  Negative for abdominal distention, abdominal pain, blood in stool, constipation, diarrhea, nausea, rectal pain and vomiting.  Endocrine: Negative.  Negative for hot flashes.  Genitourinary: Negative.  Negative for bladder incontinence, difficulty urinating, dyspareunia, dysuria, frequency, hematuria, nocturia, pelvic pain and penile discharge.   Musculoskeletal:  Positive for arthralgias (right knee, chronic). Negative for back pain, flank pain, gait problem, myalgias, neck pain and neck stiffness.  Skin:  Negative.  Negative for itching, rash and wound.  Neurological: Negative.  Negative for dizziness, extremity weakness, gait problem, headaches, light-headedness, numbness, seizures and speech difficulty.       Chronic peripheral neuropathy.   Hematological: Negative.  Negative for adenopathy. Does not bruise/bleed easily.  Psychiatric/Behavioral: Negative.  Negative for confusion, decreased concentration, depression, sleep disturbance and suicidal ideas. The patient is not nervous/anxious.   All other systems reviewed and are negative.    VITALS:  Blood pressure (!) 143/87, pulse (!) 55, temperature 97.9 F (36.6 C), temperature source Oral, resp. rate 16, height 5' 8 (1.727 m), weight 168 lb 8 oz (76.4 kg), SpO2 97%.  Wt Readings from Last 3 Encounters:  08/10/23 168 lb 8 oz (76.4 kg)  02/10/23 182 lb 1.6 oz (82.6 kg)  11/17/22 188 lb 3.2 oz (85.4 kg)    Body mass index is 25.62 kg/m.  Performance status  (ECOG): 1 - Symptomatic but completely ambulatory  PHYSICAL EXAM:  Physical Exam Vitals and nursing note reviewed.  Constitutional:      General: He is not in acute distress.    Appearance: Normal appearance. He is normal weight. He is not ill-appearing, toxic-appearing or diaphoretic.  HENT:     Head: Normocephalic and atraumatic.     Right Ear: Tympanic membrane, ear canal and external ear normal. There is no impacted cerumen.     Left Ear: Tympanic membrane, ear canal and external ear normal. There is no impacted cerumen.     Nose: Nose normal. No congestion or rhinorrhea.     Mouth/Throat:     Mouth: Mucous membranes are moist.     Pharynx: Oropharynx is clear. No oropharyngeal exudate or posterior oropharyngeal erythema.  Eyes:     General: No scleral icterus.       Right eye: No discharge.        Left eye: No discharge.     Extraocular Movements: Extraocular movements intact.     Conjunctiva/sclera: Conjunctivae normal.     Pupils: Pupils are equal, round, and reactive to light.  Neck:     Vascular: No carotid bruit.  Cardiovascular:     Rate and Rhythm: Regular rhythm.     Pulses: Normal pulses.     Heart sounds: Normal heart sounds. No murmur heard.    No friction rub. No gallop.  Pulmonary:     Effort: Pulmonary effort is normal. No respiratory distress.     Breath sounds: Normal breath sounds. No stridor. No wheezing, rhonchi or rales.  Chest:     Chest wall: No tenderness.  Abdominal:     General: Bowel sounds are normal. There is no distension.     Palpations: Abdomen is soft. There is no hepatomegaly, splenomegaly or mass.     Tenderness: There is no abdominal tenderness. There is no right CVA tenderness, left CVA tenderness, guarding or rebound.     Hernia: No hernia is present.  Musculoskeletal:        General: No swelling, tenderness, deformity or signs of injury. Normal range of motion.     Cervical back: Normal range of motion and neck supple. No rigidity  or tenderness.     Right lower leg: 1+ Edema present.     Left lower leg: No edema.     Comments: Edema hyperpigmentation on the right leg with firmness of the right calf.   Lymphadenopathy:     Cervical: No cervical adenopathy.     Upper Body:  Right upper body: No supraclavicular or axillary adenopathy.     Left upper body: No supraclavicular or axillary adenopathy.     Lower Body: No right inguinal adenopathy. No left inguinal adenopathy.  Skin:    General: Skin is warm and dry.     Coloration: Skin is not jaundiced or pale.     Findings: No bruising, erythema, lesion or rash.  Neurological:     General: No focal deficit present.     Mental Status: He is alert and oriented to person, place, and time. Mental status is at baseline.     Cranial Nerves: No cranial nerve deficit.     Sensory: No sensory deficit.     Motor: No weakness.     Coordination: Coordination normal.     Gait: Gait normal.     Deep Tendon Reflexes: Reflexes normal.  Psychiatric:        Mood and Affect: Mood normal.        Behavior: Behavior normal.        Thought Content: Thought content normal.        Judgment: Judgment normal.     LABS:   Component Ref Range & Units (02/10/22)  WBC Count 4.0 - 10.5 K/uL 5.8  RBC 4.22 - 5.81 MIL/uL 4.30  Hemoglobin 13.0 - 17.0 g/dL 86.3  HCT 60.9 - 47.9 % 41.8  MCV 80.0 - 100.0 fL 97.2  MCH 26.0 - 34.0 pg 31.6  MCHC 30.0 - 36.0 g/dL 67.4  RDW 88.4 - 84.4 % 13.2  Platelet Count 150 - 400 K/uL 156   Component Ref Range & Units 08/08/2022   Sodium 136 - 145 mmol/L 143  Potassium 3.5 - 5.1 mmol/L 5.0  Chloride 98 - 107 mmol/L 105  CO2 21 - 31 mmol/L 29  Anion Gap 6 - 14 mmol/L 9  Glucose, Random 70 - 99 mg/dL 891 High   Blood Urea Nitrogen (BUN) 7 - 25 mg/dL 15  Creatinine 9.29 - 8.69 mg/dL 8.79  eGFR >40 fO/fpw/8.26f7 71  Albumin 3.5 - 5.7 g/dL 4.8  Total Protein 6.4 - 8.9 g/dL 6.7  Bilirubin, Total 0.3 - 1.0 mg/dL 0.7  Alkaline  Phosphatase (ALP) 34 - 104 U/L 86  Aspartate Aminotransferase (AST) 13 - 39 U/L 26  Alanine Aminotransferase (ALT) 7 - 52 U/L 36  Calcium  8.6 - 10.3 mg/dL 9.2   Component Ref Range & Units 08/08/2022  PSA, Total 0.00 - 3.50 ng/mL 0.53   Component Ref Range & Units 08/08/2022  Cholesterol, Total, Lipid Panel <200 mg/dL 885  Triglycerides, Lipid Panel <150 mg/dL 888  HDL Cholesterol - Lipid Panel >=60 mg/dL 40 Low   LDL Cholesterol, Calculated <100 mg/dL 55  Non-HDL Cholesterol mg/dL 74   Component Ref Range & Units 6 mo ago (02/10/22) 1 yr ago (06/03/21) 1 yr ago (02/10/21)  Vitamin B-12 180 - 914 pg/mL 247 286 CM 299 CM      Latest Ref Rng & Units 08/10/2023    9:49 AM 02/10/2023    8:35 AM 08/11/2022    8:50 AM  CBC  WBC 4.0 - 10.5 K/uL 6.1  6.7  6.5   Hemoglobin 13.0 - 17.0 g/dL 85.7  85.2  86.1   Hematocrit 39.0 - 52.0 % 43.2  43.5  42.9   Platelets 150 - 400 K/uL 190  179  162       Latest Ref Rng & Units 02/10/2023    8:35 AM 08/11/2022    8:50 AM 06/03/2021  12:00 AM  CMP  Glucose 70 - 99 mg/dL 864  898    BUN 6 - 20 mg/dL 18  15  18       Creatinine 0.61 - 1.24 mg/dL 8.56  8.94  1.3      Sodium 135 - 145 mmol/L 144  146  144      Potassium 3.5 - 5.1 mmol/L 4.5  5.0  3.6      Chloride 98 - 111 mmol/L 104  111  104      CO2 22 - 32 mmol/L 29  27  30       Calcium  8.9 - 10.3 mg/dL 9.4  8.8  9.2      Total Protein 6.5 - 8.1 g/dL 7.4  6.8    Total Bilirubin 0.0 - 1.2 mg/dL 0.7  0.8    Alkaline Phos 38 - 126 U/L 100  76  89      AST 15 - 41 U/L 21  30  23       ALT 0 - 44 U/L 14  41  20         This result is from an external source.    Lab Results  Component Value Date   TIBC 275 01/28/2020   TIBC 185 (L) 05/01/2018   TIBC 289 10/09/2017   FERRITIN 75 01/28/2020   FERRITIN 245 05/01/2018   FERRITIN 182 10/09/2017   IRONPCTSAT 17 (L) 01/28/2020   IRONPCTSAT 23 05/01/2018   IRONPCTSAT 80 10/09/2017   Lab Results  Component Value Date   LDH 188  10/09/2017   LDH 183 08/15/2017   LDH 210 07/04/2017    STUDIES:     HISTORY:   Allergies: No Known Allergies  Current Medications: Current Outpatient Medications  Medication Sig Dispense Refill   albuterol (VENTOLIN HFA) 108 (90 Base) MCG/ACT inhaler Inhale 2 puffs into the lungs every 4 (four) hours as needed for wheezing or shortness of breath.     atorvastatin (LIPITOR) 80 MG tablet Take 80 mg by mouth daily.     buPROPion (WELLBUTRIN SR) 100 MG 12 hr tablet Take 100 mg by mouth every morning.     EPINEPHrine (EPIPEN 2-PAK) 0.3 mg/0.3 mL IJ SOAJ injection Inject 0.3 mg into the muscle as needed for anaphylaxis.     escitalopram  (LEXAPRO ) 10 MG tablet TAKE ONE TABLET BY MOUTH EVERY EVENING 90 tablet 2   ezetimibe (ZETIA) 10 MG tablet Take 10 mg by mouth daily.     folic acid  (FOLVITE ) 1 MG tablet TAKE ONE TABLET BY MOUTH EVERY EVENING 90 tablet 6   fondaparinux  (ARIXTRA ) 7.5 MG/0.6ML SOLN injection Inject 0.6 mLs (7.5 mg total) into the skin daily. 90 mL 1   gabapentin  (NEURONTIN ) 300 MG capsule TAKE ONE CAPSULE BY MOUTH EVERY DAY and TAKE 2 CAPSULES BY MOUTH AT BEDTIME 90 capsule 5   magnesium  oxide (MAG-OX) 400 MG tablet Take 400 mg by mouth daily.     mirtazapine (REMERON) 45 MG tablet Take 45 mg by mouth at bedtime.     montelukast (SINGULAIR) 10 MG tablet Take 10 mg by mouth at bedtime.     omeprazole (PRILOSEC) 40 MG capsule Take 40 mg by mouth daily.     permethrin  (ELIMITE ) 5 % cream Apply from chin to toes, leave on for 8 hours and then shower off. 240 g 0   tamsulosin  (FLOMAX ) 0.4 MG CAPS capsule TAKE ONE CAPSULE BY MOUTH EVERY EVENING 30 capsule 2   No current facility-administered  medications for this visit.

## 2023-08-28 ENCOUNTER — Other Ambulatory Visit: Payer: Self-pay | Admitting: Oncology

## 2023-08-28 DIAGNOSIS — G629 Polyneuropathy, unspecified: Secondary | ICD-10-CM

## 2023-08-30 ENCOUNTER — Telehealth: Payer: Self-pay

## 2023-08-30 NOTE — Telephone Encounter (Signed)
 [facility] staff called to confirm correct gabapentin dosage. They received a normal refill, which was for 1- 300mg  gabapentin in am, & 2 - 300mg  gabapentin@[facility] . You saw him in July, and increased the night dose to 3 - 300mg  gabapentin. So he will be taking a total of 4 - 300mg  caps gabapentin per day. They request new prescription be sent with correct dose and new qty.

## 2023-08-31 ENCOUNTER — Other Ambulatory Visit: Payer: Self-pay

## 2023-08-31 ENCOUNTER — Encounter: Payer: Self-pay | Admitting: Family

## 2023-08-31 DIAGNOSIS — G629 Polyneuropathy, unspecified: Secondary | ICD-10-CM

## 2023-08-31 MED ORDER — GABAPENTIN 300 MG PO CAPS: ORAL_CAPSULE | ORAL | 1 refills | Status: DC

## 2023-09-09 ENCOUNTER — Other Ambulatory Visit: Payer: Self-pay | Admitting: Hematology & Oncology

## 2023-09-09 DIAGNOSIS — I82413 Acute embolism and thrombosis of femoral vein, bilateral: Secondary | ICD-10-CM

## 2023-09-09 DIAGNOSIS — I824Z2 Acute embolism and thrombosis of unspecified deep veins of left distal lower extremity: Secondary | ICD-10-CM

## 2023-09-10 ENCOUNTER — Encounter: Payer: Self-pay | Admitting: Family

## 2023-09-15 ENCOUNTER — Encounter

## 2023-09-20 ENCOUNTER — Ambulatory Visit: Attending: Cardiology

## 2023-09-20 DIAGNOSIS — I6523 Occlusion and stenosis of bilateral carotid arteries: Secondary | ICD-10-CM

## 2023-09-20 DIAGNOSIS — I739 Peripheral vascular disease, unspecified: Secondary | ICD-10-CM | POA: Diagnosis not present

## 2023-09-21 ENCOUNTER — Ambulatory Visit: Payer: Self-pay | Admitting: Cardiology

## 2023-09-27 ENCOUNTER — Telehealth: Payer: Self-pay

## 2023-09-27 NOTE — Telephone Encounter (Signed)
 Left message on My Chart with carotid US  results per Dr. Tonja Fray note. Routed to PCP.

## 2023-10-05 NOTE — Telephone Encounter (Signed)
-----   Message from Lamar Fitch sent at 09/21/2023  8:27 PM EDT ----- Carotic artery show moderate disease up to 59%, continue monitoring ----- Message ----- From: Monetta Redell PARAS, MD Sent: 09/21/2023  10:25 AM EDT To: Lamar PARAS Fitch, MD Subject: FW:                                            Not sure why they sent this to me you ordered it easier patient. ----- Message ----- From: Interface, [employer] One Seven Sent: 09/20/2023   8:34 AM EDT To: Redell PARAS Monetta, MD

## 2023-12-01 ENCOUNTER — Other Ambulatory Visit: Payer: Self-pay | Admitting: Hematology and Oncology

## 2023-12-01 DIAGNOSIS — I82531 Chronic embolism and thrombosis of right popliteal vein: Secondary | ICD-10-CM

## 2023-12-01 DIAGNOSIS — I824Z2 Acute embolism and thrombosis of unspecified deep veins of left distal lower extremity: Secondary | ICD-10-CM

## 2023-12-01 DIAGNOSIS — I82413 Acute embolism and thrombosis of femoral vein, bilateral: Secondary | ICD-10-CM

## 2023-12-01 DIAGNOSIS — I2692 Saddle embolus of pulmonary artery without acute cor pulmonale: Secondary | ICD-10-CM

## 2023-12-01 DIAGNOSIS — Z86711 Personal history of pulmonary embolism: Secondary | ICD-10-CM

## 2023-12-09 ENCOUNTER — Other Ambulatory Visit: Payer: Self-pay | Admitting: Hematology and Oncology

## 2023-12-09 DIAGNOSIS — G629 Polyneuropathy, unspecified: Secondary | ICD-10-CM

## 2023-12-10 ENCOUNTER — Other Ambulatory Visit: Payer: Self-pay | Admitting: Hematology & Oncology

## 2023-12-10 DIAGNOSIS — I82413 Acute embolism and thrombosis of femoral vein, bilateral: Secondary | ICD-10-CM

## 2023-12-10 DIAGNOSIS — I824Z2 Acute embolism and thrombosis of unspecified deep veins of left distal lower extremity: Secondary | ICD-10-CM

## 2023-12-11 ENCOUNTER — Encounter: Payer: Self-pay | Admitting: Family

## 2024-02-02 ENCOUNTER — Other Ambulatory Visit: Payer: Self-pay | Admitting: Hematology & Oncology

## 2024-02-02 DIAGNOSIS — I824Z2 Acute embolism and thrombosis of unspecified deep veins of left distal lower extremity: Secondary | ICD-10-CM

## 2024-02-02 DIAGNOSIS — I82413 Acute embolism and thrombosis of femoral vein, bilateral: Secondary | ICD-10-CM

## 2024-02-02 DIAGNOSIS — F419 Anxiety disorder, unspecified: Secondary | ICD-10-CM

## 2024-02-13 ENCOUNTER — Inpatient Hospital Stay: Admitting: Oncology

## 2024-02-13 ENCOUNTER — Inpatient Hospital Stay

## 2024-02-15 NOTE — Progress Notes (Signed)
 VASCULAR AND ENDOVASCULAR SURGERY CLINIC NOTE  Follow up visit  Aaron Cunningham is a 59 yo presenting for follow up of PAOD, with hx of R CIA stenting, he also has known hypercoagulable disorder, hx of IVD filter and prior DVT's in right leg. He has been on Arixtra .   He has been doing well since we last saw him last year.  No major medical problems, hospital stays or surgeries. No MI, no stroke.  Has chronic pain RLE due to venous insufficiency with recurrent DVT's. Has chronic swelling but has been manageable.   Denies rest pain or ulcerations. Denies chest pain or shortness of breath Denies recent fevers or chills  Compliant with medications.  He quit smoking !  Briefly: HTN, HLD, CAD, hypercoagulable disorder presenting to our clinic for evaluation of RLE pain in the setting of known peripheral arterial disease, He has known PAD, underwent R CIA in 2007 in [place], followed there for several years.  He also has undiagnosed hypercoagulable disorder with recurrent RLE DVT despite anticoagulation, failed anticoagulation with oral anticoagulants, since last year he has been on Arixtra .  Had IVC filter placed in 2023    BP 153/83 (BP Location: Right arm, Patient Position: Sitting)   Pulse 56   Ht 1.727 m (5' 8)   Wt 80.7 kg (178 lb)   BMI 27.06 kg/m    NAD, comfortable Normal work of breathing RRR Palpable radial, femoral and PT pulses bilaterally Abdomen soft, non tender, non distended.Non palpable pulsatile masses. Mild lower extremity edema in the right side, no ulcerations.   I personally reviewed non invasive arterial studies today  ABI >1 bilaterally, multiphasic flow  Technically difficult study due to bowel gas. Right  Normal ankle-brachial index. Patent common iliac artery stent with mild stenosis in the mid to distal stent. Left  Normal ankle-brachial index.  This result has not been signed. Info    Carotid duplex 09/20/2023  Right Carotid: Velocities in  the right ICA are consistent with a 40-59% stenosis.   Left Carotid: Velocities in the left ICA are consistent with a 40-59% stenosis.   Vertebrals:  Bilateral vertebral arteries demonstrate antegrade flow.  Subclavians: Normal flow hemodynamics were seen in bilateral subclavian               arteries.     Assessment and Plan:  Aaron Cunningham is a 59 yo with HTN, HLD, undiagnosed hypercoagulable disorder, recurrent DVT and chronic venous insufficiency RLE presenting with PAD.  Has R CIA stent, widely patent, no critical stenosis.  Has normal ABI with normal pulse exam Continue ASA 81 mg daily, he is on high dose statin.  Has quit smoking Follow up one year,  he will call us  in the interim if he has any further questions or concerns.

## 2024-02-23 ENCOUNTER — Encounter: Payer: Self-pay | Admitting: Oncology

## 2024-02-23 ENCOUNTER — Other Ambulatory Visit: Payer: Self-pay | Admitting: Oncology

## 2024-02-23 ENCOUNTER — Inpatient Hospital Stay: Admitting: Oncology

## 2024-02-23 ENCOUNTER — Inpatient Hospital Stay

## 2024-02-23 VITALS — BP 136/78 | HR 65 | Temp 98.2°F | Resp 16 | Ht 68.0 in | Wt 180.6 lb

## 2024-02-23 DIAGNOSIS — Z86718 Personal history of other venous thrombosis and embolism: Secondary | ICD-10-CM

## 2024-02-23 DIAGNOSIS — D519 Vitamin B12 deficiency anemia, unspecified: Secondary | ICD-10-CM

## 2024-02-23 DIAGNOSIS — Z86711 Personal history of pulmonary embolism: Secondary | ICD-10-CM

## 2024-02-23 DIAGNOSIS — I82531 Chronic embolism and thrombosis of right popliteal vein: Secondary | ICD-10-CM

## 2024-02-23 DIAGNOSIS — I824Z2 Acute embolism and thrombosis of unspecified deep veins of left distal lower extremity: Secondary | ICD-10-CM

## 2024-02-23 DIAGNOSIS — I2692 Saddle embolus of pulmonary artery without acute cor pulmonale: Secondary | ICD-10-CM

## 2024-02-23 LAB — CMP (CANCER CENTER ONLY)
ALT: 17 U/L (ref 0–44)
AST: 22 U/L (ref 15–41)
Albumin: 4.1 g/dL (ref 3.5–5.0)
Alkaline Phosphatase: 67 U/L (ref 38–126)
Anion gap: 8 (ref 5–15)
BUN: 12 mg/dL (ref 6–20)
CO2: 29 mmol/L (ref 22–32)
Calcium: 8.8 mg/dL — ABNORMAL LOW (ref 8.9–10.3)
Chloride: 105 mmol/L (ref 98–111)
Creatinine: 1.29 mg/dL — ABNORMAL HIGH (ref 0.61–1.24)
GFR, Estimated: >60 mL/min
Glucose, Bld: 96 mg/dL (ref 70–99)
Potassium: 4.3 mmol/L (ref 3.5–5.1)
Sodium: 142 mmol/L (ref 135–145)
Total Bilirubin: 0.6 mg/dL (ref 0.0–1.2)
Total Protein: 6.2 g/dL — ABNORMAL LOW (ref 6.5–8.1)

## 2024-02-23 LAB — MAGNESIUM: Magnesium: 1.8 mg/dL (ref 1.7–2.4)

## 2024-02-23 LAB — CBC WITH DIFFERENTIAL (CANCER CENTER ONLY)
Abs Immature Granulocytes: 0.02 10*3/uL (ref 0.00–0.07)
Basophils Absolute: 0 10*3/uL (ref 0.0–0.1)
Basophils Relative: 0 %
Eosinophils Absolute: 0.2 10*3/uL (ref 0.0–0.5)
Eosinophils Relative: 4 %
HCT: 37.9 % — ABNORMAL LOW (ref 39.0–52.0)
Hemoglobin: 12.7 g/dL — ABNORMAL LOW (ref 13.0–17.0)
Immature Granulocytes: 1 %
Lymphocytes Relative: 33 %
Lymphs Abs: 1.3 10*3/uL (ref 0.7–4.0)
MCH: 32.3 pg (ref 26.0–34.0)
MCHC: 33.5 g/dL (ref 30.0–36.0)
MCV: 96.4 fL (ref 80.0–100.0)
Monocytes Absolute: 0.3 10*3/uL (ref 0.1–1.0)
Monocytes Relative: 8 %
Neutro Abs: 2.2 10*3/uL (ref 1.7–7.7)
Neutrophils Relative %: 54 %
Platelet Count: 145 10*3/uL — ABNORMAL LOW (ref 150–400)
RBC: 3.93 MIL/uL — ABNORMAL LOW (ref 4.22–5.81)
RDW: 12.9 % (ref 11.5–15.5)
WBC Count: 4.1 10*3/uL (ref 4.0–10.5)
nRBC: 0 % (ref 0.0–0.2)

## 2024-02-23 LAB — FERRITIN: Ferritin: 94 ng/mL (ref 24–336)

## 2024-02-23 LAB — IRON AND TIBC
Iron: 104 ug/dL (ref 45–182)
Saturation Ratios: 33 % (ref 17.9–39.5)
TIBC: 319 ug/dL (ref 250–450)
UIBC: 215 ug/dL

## 2024-02-23 LAB — VITAMIN B12: Vitamin B-12: 2063 pg/mL — ABNORMAL HIGH (ref 180–914)

## 2024-02-23 LAB — FOLATE: Folate: >20 ng/mL

## 2024-02-23 NOTE — Progress Notes (Shared)
 " [facility]  [address] [place],  [place]  [address] [phone]  Clinic Day:  02/23/2024   Referring physician: Ofilia Lamar CROME, MD  ASSESSMENT & PLAN:  Assessment: 1.  Multiple recurrences of deep venous thrombosis, despite being on low-molecular weight heparin . He has been seen by Dr. Maude Crease in the past, and was negative for polycythemia vera. He was changed from Lovenox  at 120 mg (1 mg/kg) to Arixtra  injections 7.5mg  (total) daily and is tolerating this well but complains of feeling cold all the time. I recommend that he continue Arixtra  indefinitely.     2.  Episode of pulmonary emboli.   3. Extensive thrombosis of the right lower extremity throughout the leg and occlusive thrombus in the left common femoral vein as well.  This has been debilitating and painful.  He was given Tramadol  without much relief, and so has been using Tylenol .  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  He has clot in the inferior vena cava and surrounding his IVC filter so is at extremely high risk for complications.   4. Tobacco abuse.  He has smoked 1 pack per day for at least 30 years. Trying to cut back and he hopes to quit soon. He quit smoking on 04/16/2023.    5. Alcohol abuse.  He has stopped drinking alcohol since January 2022.   6. Peripheral Neuropathy. This is part of his chronic pain and may be related to his prior alcohol abuse.  He does not notice much improvement in the neuropathy, but medicaiton is helping his restless leg syndrome symptoms. He is on gabapentin  and will continue to take 3 capsules at bedtime.   7. Low-normal B-12 and he was taking oral supplement. His last B-12 was elevated over 1,000 and I told him he could stop that. B12 level pending today as well as repeat iron studies and folate.   8. Hypocalcemia, mild. I have advised him to increase calcium  in his diet  and take occasional Tums.  Plan: He complains of pruritus on his lower legs and hips. He reports that he visited a dermatologist who treated him for bug bites. I instructed him to use moisturizing lotion to alleviate the itching. He is currently taking 1 mg folic acid  every evening without difficulty. He reports smoking cessation on 04/16/2023.  He has a WBC of 4.1, a low hemoglobin of 12.7 down from 14.2, and a low platelet count of 145,000 down from 190,000. His CMP reveals an elevated creatinine of 1.29 down from 1.36, a slightly low calcium  of 8.8 down from 9.1, and a low total protein of 6.2 down from 6.9. His vitamin B12 is pending. I added iron studies, folate, and magnesium  to his labs today and will call him with any abnormal results. I encouraged him to get plenty of calcium  and protein in his diet. He denies any overt bleeding. I will see him back in 6 months with CBC and CMP. I discussed the assessment and treatment plan with the patient and he was provided an opportunity to ask questions and all were answered. The patient agreed with the plan and demonstrated an understanding of the instructions.   I provided 16 minutes of face-to-face time during this this encounter and > 50% was spent counseling as documented under my assessment and plan.   Wanda VEAR Cornish, MD  [facility] [place] -  A DE[facility] [address] [place] [address] Dept: [phone] Dept Fax: [phone]   CHIEF COMPLAINT:  CC: History of DVT  Current Treatment:  Arixtra   HISTORY OF PRESENT ILLNESS:  History of recurrent deep venous thrombosis and episode of pulmonary emboli who has an IVC filter in place.  His 1st episode was in December of 2016 and it has always been the right lower extremity.  He was admitted earlier in January 2022 for recurrent DVT but we are not sure about his compliance with the Arixtra .  He presented with worsening deep venous  thrombosis of the entire right lower extremity as well as the left with thrombosis of the inferior neck cava filter, with thrombus extending above the filter to the level of the renal vein.  He was having severe pain and 3 to 4+ edema of the right lower extremity, and was unable to stand or ambulate.  He was admitted for IV heparin  and was then switched to Lovenox  at 1 milligram/kilogram twice daily rather than 1.5 milligrams/kilogram once daily.  The Doppler confirmed extensive deep venous thrombosis throughout the right lower extremity which is extensive and occlusive.  He has seen a vascular surgeon in the past and has had a stent placed as well as the IVC filter at [facility].  A vascular surgeon was contacted when the patient was in the emergency room and did not have any specific recommendations at this time.  He has seen vascular surgeons in the past.  I ordered oxycodone  for the patient's pain and it does give him some relief.  He continues to smoke and was drinking alcohol heavily previously.  At some point, he should probably have removal of the IVC filter, but I feel it would be too risky at this time. He was discharged on January 29th.  His daughter has undergone genetic testing and does have a MTHFR, homozygous mutation.  I went through his records from Dr. Maude Crease, and did not see that he was checked for hypercoagulable state, but he was negative for JAK2 mutation (polycythemia vera).  Factor IV Leiden was negative in March.  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  However, he was already on anticoagulant therapy at that time.  We will monitor this periodically.  He also has problems with hypertension and did have a COVID infection in February.   INTERVAL HISTORY:  Aaron Cunningham is here for routine follow up for his history of DVT and pulmonary emboli. He has thrombus around his IVC filter and so it has been left in place. He remains on  chronic anticoagulation. Patient states that he feels well but complains of pruritus on his lower legs and hips. He reports that he visited a dermatologist who treated him for bug bites. I instructed him to use moisturizing lotion to alleviate the itching. He is currently taking 1 mg folic acid  every evening without difficulty. He reports smoking cessation on 04/16/2023.  He has a WBC of 4.1, a low hemoglobin of 12.7 down from 14.2, and a low platelet count of 145,000 down from 190,000. His CMP reveals an elevated creatinine of 1.29 down from 1.36, a slightly low calcium  of 8.8 down from 9.1, and a low total protein of 6.2 down from 6.9. His vitamin B12 is pending. I added iron studies, folate, and magnesium  to his labs today and will call him with any abnormal results. I encouraged him to get plenty of  calcium  and protein in his diet. He denies any overt bleeding. I will see him back in 6 months with CBC and CMP.  He denies fever, chills, night sweats, or other signs of infection. He denies cardiorespiratory and gastrointestinal issues. He  denies pain. His appetite is good and His weight has increased 12 pounds over last 6 months.  REVIEW OF SYSTEMS:  Review of Systems  Constitutional: Negative.  Negative for appetite change, chills, diaphoresis, fatigue, fever and unexpected weight change.  HENT:  Negative.  Negative for hearing loss, lump/mass, mouth sores, nosebleeds, sore throat, tinnitus, trouble swallowing and voice change.   Eyes: Negative.  Negative for eye problems and icterus.  Respiratory: Negative.  Negative for chest tightness, cough, hemoptysis, shortness of breath and wheezing.   Cardiovascular:  Negative for chest pain, leg swelling and palpitations.  Gastrointestinal: Negative.  Negative for abdominal distention, abdominal pain, blood in stool, constipation, diarrhea, nausea, rectal pain and vomiting.  Endocrine: Negative.  Negative for hot flashes.  Genitourinary: Negative.  Negative  for bladder incontinence, difficulty urinating, dyspareunia, dysuria, frequency, hematuria, nocturia, pelvic pain and penile discharge.   Musculoskeletal:  Positive for arthralgias (right knee, chronic). Negative for back pain, flank pain, gait problem, myalgias, neck pain and neck stiffness.  Skin:  Positive for itching (bilateral lower extremities). Negative for rash and wound.  Neurological:  Positive for numbness. Negative for dizziness, extremity weakness, gait problem, headaches, light-headedness, seizures and speech difficulty.       Chronic peripheral neuropathy.   Hematological: Negative.  Negative for adenopathy. Does not bruise/bleed easily.  Psychiatric/Behavioral: Negative.  Negative for confusion, decreased concentration, depression, sleep disturbance and suicidal ideas. The patient is not nervous/anxious.   All other systems reviewed and are negative.   VITALS:  Blood pressure 136/78, pulse 65, temperature 98.2 F (36.8 C), temperature source Oral, resp. rate 16, height 5' 8 (1.727 m), weight 180 lb 9.6 oz (81.9 kg), SpO2 100%.  Wt Readings from Last 3 Encounters:  02/23/24 180 lb 9.6 oz (81.9 kg)  08/10/23 168 lb 8 oz (76.4 kg)  02/10/23 182 lb 1.6 oz (82.6 kg)    Body mass index is 27.46 kg/m.  Performance status (ECOG): 1 - Symptomatic but completely ambulatory  PHYSICAL EXAM:  Physical Exam Vitals and nursing note reviewed.  Constitutional:      General: He is not in acute distress.    Appearance: Normal appearance. He is normal weight. He is not ill-appearing, toxic-appearing or diaphoretic.  HENT:     Head: Normocephalic and atraumatic.     Right Ear: Tympanic membrane, ear canal and external ear normal. There is no impacted cerumen.     Left Ear: Tympanic membrane, ear canal and external ear normal. There is no impacted cerumen.     Nose: Nose normal. No congestion or rhinorrhea.     Mouth/Throat:     Mouth: Mucous membranes are moist.     Pharynx: Oropharynx  is clear. No oropharyngeal exudate or posterior oropharyngeal erythema.  Eyes:     General: No scleral icterus.       Right eye: No discharge.        Left eye: No discharge.     Extraocular Movements: Extraocular movements intact.     Conjunctiva/sclera: Conjunctivae normal.     Pupils: Pupils are equal, round, and reactive to light.  Neck:     Vascular: No carotid bruit.  Cardiovascular:     Rate and Rhythm: Regular rhythm.     Pulses:  Normal pulses.     Heart sounds: Normal heart sounds. No murmur heard.    No friction rub. No gallop.  Pulmonary:     Effort: Pulmonary effort is normal. No respiratory distress.     Breath sounds: Normal breath sounds. No stridor. No wheezing, rhonchi or rales.  Chest:     Chest wall: No tenderness.  Abdominal:     General: Bowel sounds are normal. There is no distension.     Palpations: Abdomen is soft. There is no hepatomegaly, splenomegaly or mass.     Tenderness: There is no abdominal tenderness. There is no right CVA tenderness, left CVA tenderness, guarding or rebound.     Hernia: No hernia is present.  Musculoskeletal:        General: No swelling, tenderness, deformity or signs of injury. Normal range of motion.     Cervical back: Normal range of motion and neck supple. No rigidity or tenderness.     Right lower leg: No edema.     Left lower leg: No edema.     Comments: Both lower extremities have resolving excoriated areas and dry skin but no specific findings.  Lymphadenopathy:     Cervical: No cervical adenopathy.     Upper Body:     Right upper body: No supraclavicular or axillary adenopathy.     Left upper body: No supraclavicular or axillary adenopathy.     Lower Body: No right inguinal adenopathy. No left inguinal adenopathy.  Skin:    General: Skin is warm and dry.     Coloration: Skin is not jaundiced or pale.     Findings: No bruising, erythema, lesion or rash.  Neurological:     General: No focal deficit present.      Mental Status: He is alert and oriented to person, place, and time. Mental status is at baseline.     Cranial Nerves: No cranial nerve deficit.     Sensory: No sensory deficit.     Motor: No weakness.     Coordination: Coordination normal.     Gait: Gait normal.     Deep Tendon Reflexes: Reflexes normal.  Psychiatric:        Mood and Affect: Mood normal.        Behavior: Behavior normal.        Thought Content: Thought content normal.        Judgment: Judgment normal.    LABS:   Component Ref Range & Units (02/10/22)  WBC Count 4.0 - 10.5 K/uL 5.8  RBC 4.22 - 5.81 MIL/uL 4.30  Hemoglobin 13.0 - 17.0 g/dL 86.3  HCT 60.9 - 47.9 % 41.8  MCV 80.0 - 100.0 fL 97.2  MCH 26.0 - 34.0 pg 31.6  MCHC 30.0 - 36.0 g/dL 67.4  RDW 88.4 - 84.4 % 13.2  Platelet Count 150 - 400 K/uL 156   Component Ref Range & Units 08/08/2022   Sodium 136 - 145 mmol/L 143  Potassium 3.5 - 5.1 mmol/L 5.0  Chloride 98 - 107 mmol/L 105  CO2 21 - 31 mmol/L 29  Anion Gap 6 - 14 mmol/L 9  Glucose, Random 70 - 99 mg/dL 891 High   Blood Urea Nitrogen (BUN) 7 - 25 mg/dL 15  Creatinine 9.29 - 8.69 mg/dL 8.79  eGFR >40 fO/fpw/8.26f7 71  Albumin 3.5 - 5.7 g/dL 4.8  Total Protein 6.4 - 8.9 g/dL 6.7  Bilirubin, Total 0.3 - 1.0 mg/dL 0.7  Alkaline Phosphatase (ALP) 34 - 104 U/L 86  Aspartate Aminotransferase (AST) 13 - 39 U/L 26  Alanine Aminotransferase (ALT) 7 - 52 U/L 36  Calcium  8.6 - 10.3 mg/dL 9.2   Component Ref Range & Units 08/08/2022  PSA, Total 0.00 - 3.50 ng/mL 0.53   Component Ref Range & Units 08/08/2022  Cholesterol, Total, Lipid Panel <200 mg/dL 885  Triglycerides, Lipid Panel <150 mg/dL 888  HDL Cholesterol - Lipid Panel >=60 mg/dL 40 Low   LDL Cholesterol, Calculated <100 mg/dL 55  Non-HDL Cholesterol mg/dL 74   Component Ref Range & Units 6 mo ago (02/10/22) 1 yr ago (06/03/21) 1 yr ago (02/10/21)  Vitamin B-12 180 - 914 pg/mL 247 286 CM 299 CM       Latest Ref Rng & Units 02/23/2024    2:07 PM 08/10/2023    9:49 AM 02/10/2023    8:35 AM  CBC  WBC 4.0 - 10.5 K/uL 4.1  6.1  6.7   Hemoglobin 13.0 - 17.0 g/dL 87.2  85.7  85.2   Hematocrit 39.0 - 52.0 % 37.9  43.2  43.5   Platelets 150 - 400 K/uL 145  190  179       Latest Ref Rng & Units 02/23/2024    2:07 PM 08/10/2023    9:49 AM 02/10/2023    8:35 AM  CMP  Glucose 70 - 99 mg/dL 96  865  864   BUN 6 - 20 mg/dL 12  17  18    Creatinine 0.61 - 1.24 mg/dL 8.70  8.63  8.56   Sodium 135 - 145 mmol/L 142  143  144   Potassium 3.5 - 5.1 mmol/L 4.3  4.9  4.5   Chloride 98 - 111 mmol/L 105  105  104   CO2 22 - 32 mmol/L 29  26  29    Calcium  8.9 - 10.3 mg/dL 8.8  9.1  9.4   Total Protein 6.5 - 8.1 g/dL 6.2  6.9  7.4   Total Bilirubin 0.0 - 1.2 mg/dL 0.6  0.6  0.7   Alkaline Phos 38 - 126 U/L 67  82  100   AST 15 - 41 U/L 22  32  21   ALT 0 - 44 U/L 17  36  14     Lab Results  Component Value Date   TIBC 319 02/23/2024   TIBC 275 01/28/2020   TIBC 185 (L) 05/01/2018   FERRITIN 94 02/23/2024   FERRITIN 75 01/28/2020   FERRITIN 245 05/01/2018   IRONPCTSAT 33 02/23/2024   IRONPCTSAT 17 (L) 01/28/2020   IRONPCTSAT 23 05/01/2018   Lab Results  Component Value Date   LDH 188 10/09/2017   LDH 183 08/15/2017   LDH 210 07/04/2017    STUDIES:     HISTORY:   Allergies: No Known Allergies  Current Medications: Current Outpatient Medications  Medication Sig Dispense Refill   albuterol (VENTOLIN HFA) 108 (90 Base) MCG/ACT inhaler Inhale 2 puffs into the lungs every 4 (four) hours as needed for wheezing or shortness of breath.     atorvastatin (LIPITOR) 80 MG tablet Take 80 mg by mouth daily.     buPROPion (WELLBUTRIN SR) 100 MG 12 hr tablet Take 100 mg by mouth every morning.     EPINEPHrine (EPIPEN 2-PAK) 0.3 mg/0.3 mL IJ SOAJ injection Inject 0.3 mg into the muscle as needed for anaphylaxis.     escitalopram  (LEXAPRO ) 10 MG tablet TAKE ONE TABLET BY MOUTH EVERY EVENING 90 tablet 6    ezetimibe (ZETIA) 10  MG tablet Take 10 mg by mouth daily.     folic acid  (FOLVITE ) 1 MG tablet TAKE ONE TABLET BY MOUTH EVERY EVENING 90 tablet 6   fondaparinux  (ARIXTRA ) 7.5 MG/0.6ML SOLN injection INJECT 0.6 ML UNDER THE SKIN DAILY 90 mL 1   gabapentin  (NEURONTIN ) 300 MG capsule TAKE ONE CAPSULE BY MOUTH EVERY DAY and TAKE 3 CAPSULES BY MOUTH AT BEDTIME 360 capsule 1   magnesium  oxide (MAG-OX) 400 MG tablet Take 400 mg by mouth daily.     mirtazapine (REMERON) 45 MG tablet Take 45 mg by mouth at bedtime.     montelukast (SINGULAIR) 10 MG tablet Take 10 mg by mouth at bedtime.     omeprazole (PRILOSEC) 40 MG capsule Take 40 mg by mouth daily.     permethrin  (ELIMITE ) 5 % cream Apply from chin to toes, leave on for 8 hours and then shower off. 240 g 0   tamsulosin  (FLOMAX ) 0.4 MG CAPS capsule TAKE ONE CAPSULE BY MOUTH EVERY EVENING 30 capsule 6   No current facility-administered medications for this visit.   LILLETTE Aretta Cook, acting as a scribe for Wanda VEAR Cornish, MD, have documented all relevant documentation on the behalf of Wanda VEAR Cornish, MD, as directed by Wanda VEAR Cornish, MD, while in the presence of Wanda VEAR Cornish, MD.  I, Wanda VEAR Cornish, MD , have reviewed this report as typed by the medical scribe, and it is complete and accurate.  "
